# Patient Record
Sex: Male | Born: 1950 | Race: White | Hispanic: No | Smoking: Current every day smoker
Health system: Southern US, Community
[De-identification: ages and names within clinical notes are randomized; demographics above are authoritative.]

## PROBLEM LIST (undated history)

## (undated) DIAGNOSIS — J189 Pneumonia, unspecified organism: Secondary | ICD-10-CM

## (undated) DIAGNOSIS — E785 Hyperlipidemia, unspecified: Secondary | ICD-10-CM

## (undated) DIAGNOSIS — K219 Gastro-esophageal reflux disease without esophagitis: Secondary | ICD-10-CM

## (undated) DIAGNOSIS — F319 Bipolar disorder, unspecified: Secondary | ICD-10-CM

## (undated) DIAGNOSIS — I10 Essential (primary) hypertension: Secondary | ICD-10-CM

## (undated) DIAGNOSIS — IMO0001 Reserved for inherently not codable concepts without codable children: Secondary | ICD-10-CM

## (undated) DIAGNOSIS — J439 Emphysema, unspecified: Secondary | ICD-10-CM

## (undated) DIAGNOSIS — I499 Cardiac arrhythmia, unspecified: Secondary | ICD-10-CM

## (undated) DIAGNOSIS — I714 Abdominal aortic aneurysm, without rupture: Secondary | ICD-10-CM

## (undated) DIAGNOSIS — C3490 Malignant neoplasm of unspecified part of unspecified bronchus or lung: Secondary | ICD-10-CM

## (undated) HISTORY — DX: Emphysema, unspecified: J43.9

## (undated) HISTORY — PX: UPPER GI ENDOSCOPY: SHX6162

## (undated) HISTORY — PX: COLONOSCOPY W/ POLYPECTOMY: SHX1380

## (undated) HISTORY — DX: Malignant neoplasm of unspecified part of unspecified bronchus or lung: C34.90

## (undated) HISTORY — DX: Essential (primary) hypertension: I10

## (undated) HISTORY — DX: Abdominal aortic aneurysm, without rupture: I71.4

## (undated) HISTORY — PX: LUMBAR DISC SURGERY: SHX700

## (undated) HISTORY — PX: THUMB FUSION: SUR636

---

## 2011-02-22 ENCOUNTER — Inpatient Hospital Stay (HOSPITAL_COMMUNITY): Payer: 59

## 2011-02-22 ENCOUNTER — Inpatient Hospital Stay (HOSPITAL_COMMUNITY)
Admission: AD | Admit: 2011-02-22 | Discharge: 2011-02-26 | DRG: 189 | Disposition: A | Discharge status: Home / Self Care | Payer: 59 | Source: Other Acute Inpatient Hospital | Attending: Critical Care Medicine | Admitting: Critical Care Medicine

## 2011-02-22 DIAGNOSIS — Z6826 Body mass index (BMI) 26.0-26.9, adult: Secondary | ICD-10-CM

## 2011-02-22 DIAGNOSIS — R4182 Altered mental status, unspecified: Secondary | ICD-10-CM | POA: Diagnosis present

## 2011-02-22 DIAGNOSIS — J449 Chronic obstructive pulmonary disease, unspecified: Secondary | ICD-10-CM | POA: Diagnosis present

## 2011-02-22 DIAGNOSIS — E871 Hypo-osmolality and hyponatremia: Secondary | ICD-10-CM | POA: Diagnosis present

## 2011-02-22 DIAGNOSIS — F1010 Alcohol abuse, uncomplicated: Secondary | ICD-10-CM | POA: Diagnosis present

## 2011-02-22 DIAGNOSIS — J810 Acute pulmonary edema: Secondary | ICD-10-CM

## 2011-02-22 DIAGNOSIS — I5033 Acute on chronic diastolic (congestive) heart failure: Secondary | ICD-10-CM | POA: Diagnosis present

## 2011-02-22 DIAGNOSIS — I10 Essential (primary) hypertension: Secondary | ICD-10-CM | POA: Diagnosis present

## 2011-02-22 DIAGNOSIS — R0902 Hypoxemia: Secondary | ICD-10-CM | POA: Diagnosis present

## 2011-02-22 DIAGNOSIS — J9600 Acute respiratory failure, unspecified whether with hypoxia or hypercapnia: Secondary | ICD-10-CM | POA: Diagnosis present

## 2011-02-22 DIAGNOSIS — G9340 Encephalopathy, unspecified: Secondary | ICD-10-CM

## 2011-02-22 DIAGNOSIS — I509 Heart failure, unspecified: Secondary | ICD-10-CM | POA: Diagnosis present

## 2011-02-22 DIAGNOSIS — E46 Unspecified protein-calorie malnutrition: Secondary | ICD-10-CM | POA: Diagnosis present

## 2011-02-22 DIAGNOSIS — J4489 Other specified chronic obstructive pulmonary disease: Secondary | ICD-10-CM | POA: Diagnosis present

## 2011-02-22 DIAGNOSIS — F17200 Nicotine dependence, unspecified, uncomplicated: Secondary | ICD-10-CM | POA: Diagnosis present

## 2011-02-22 LAB — LACTIC ACID, PLASMA: Lactic Acid, Venous: 1.7 mmol/L (ref 0.5–2.2)

## 2011-02-22 LAB — DIFFERENTIAL
Basophils Absolute: 0 10*3/uL (ref 0.0–0.1)
Basophils Relative: 0 % (ref 0–1)
Eosinophils Relative: 0 % (ref 0–5)
Lymphocytes Relative: 5 % — ABNORMAL LOW (ref 12–46)

## 2011-02-22 LAB — PROTIME-INR: Prothrombin Time: 13.6 seconds (ref 11.6–15.2)

## 2011-02-22 LAB — CBC
MCHC: >38.5 g/dL — ABNORMAL HIGH (ref 30.0–36.0)
RDW: 12.8 % (ref 11.5–15.5)
WBC: 6.6 10*3/uL (ref 4.0–10.5)

## 2011-02-22 LAB — COMPREHENSIVE METABOLIC PANEL
ALT: 21 U/L (ref 0–53)
AST: 89 U/L — ABNORMAL HIGH (ref 0–37)
Albumin: 4.2 g/dL (ref 3.5–5.2)
Alkaline Phosphatase: 98 U/L (ref 39–117)
Potassium: 4.1 mEq/L (ref 3.5–5.1)
Sodium: 109 mEq/L — CL (ref 135–145)
Total Protein: 7.3 g/dL (ref 6.0–8.3)

## 2011-02-22 LAB — D-DIMER, QUANTITATIVE: D-Dimer, Quant: 0.85 ug/mL-FEU — ABNORMAL HIGH (ref 0.00–0.48)

## 2011-02-22 LAB — GLUCOSE, CAPILLARY: Glucose-Capillary: 140 mg/dL — ABNORMAL HIGH (ref 70–99)

## 2011-02-22 LAB — CARDIAC PANEL(CRET KIN+CKTOT+MB+TROPI)
CK, MB: 87.8 ng/mL (ref 0.3–4.0)
Troponin I: 0.45 ng/mL (ref <0.30)

## 2011-02-22 LAB — PHOSPHORUS: Phosphorus: 2.2 mg/dL — ABNORMAL LOW (ref 2.3–4.6)

## 2011-02-22 MED ORDER — PANTOPRAZOLE SODIUM 40 MG IV SOLR
40.0000 mg | Freq: Every day | INTRAVENOUS | Status: DC
  Administered 2011-02-23: 40 mg via INTRAVENOUS
  Filled 2011-02-22 (×2): qty 40

## 2011-02-22 MED ORDER — MAGNESIUM SULFATE 40 MG/ML IJ SOLN
2.0000 g | Freq: Once | INTRAMUSCULAR | Status: AC
  Administered 2011-02-22: 2 g via INTRAVENOUS
  Filled 2011-02-22: qty 50

## 2011-02-22 MED ORDER — ADULT MULTIVITAMIN W/MINERALS CH
1.0000 | ORAL_TABLET | Freq: Every day | ORAL | Status: DC
  Administered 2011-02-23 – 2011-02-26 (×5): 1 via ORAL
  Filled 2011-02-22 (×5): qty 1

## 2011-02-22 MED ORDER — HEPARIN SODIUM (PORCINE) 5000 UNIT/ML IJ SOLN
5000.0000 [IU] | Freq: Three times a day (TID) | INTRAMUSCULAR | Status: DC
  Administered 2011-02-23 – 2011-02-26 (×11): 5000 [IU] via SUBCUTANEOUS
  Filled 2011-02-22 (×14): qty 1

## 2011-02-22 MED ORDER — LOSARTAN POTASSIUM 50 MG PO TABS
50.0000 mg | ORAL_TABLET | Freq: Every day | ORAL | Status: DC
  Administered 2011-02-22 – 2011-02-26 (×5): 50 mg via ORAL
  Filled 2011-02-22 (×5): qty 1

## 2011-02-22 MED ORDER — TIOTROPIUM BROMIDE MONOHYDRATE 18 MCG IN CAPS
18.0000 ug | ORAL_CAPSULE | Freq: Every day | RESPIRATORY_TRACT | Status: DC
  Administered 2011-02-23 – 2011-02-26 (×3): 18 ug via RESPIRATORY_TRACT
  Filled 2011-02-22: qty 5

## 2011-02-22 MED ORDER — LORAZEPAM 2 MG/ML IJ SOLN
1.0000 mg | Freq: Four times a day (QID) | INTRAMUSCULAR | Status: AC | PRN
  Administered 2011-02-22 – 2011-02-23 (×2): 1 mg via INTRAVENOUS
  Filled 2011-02-22 (×2): qty 1

## 2011-02-22 MED ORDER — FUROSEMIDE 10 MG/ML IJ SOLN
40.0000 mg | Freq: Two times a day (BID) | INTRAMUSCULAR | Status: DC
  Administered 2011-02-23 – 2011-02-25 (×5): 40 mg via INTRAVENOUS
  Filled 2011-02-22 (×6): qty 4

## 2011-02-22 MED ORDER — FOLIC ACID 1 MG PO TABS
1.0000 mg | ORAL_TABLET | Freq: Every day | ORAL | Status: DC
  Administered 2011-02-23 – 2011-02-26 (×5): 1 mg via ORAL
  Filled 2011-02-22 (×5): qty 1

## 2011-02-22 MED ORDER — LORAZEPAM 1 MG PO TABS
1.0000 mg | ORAL_TABLET | Freq: Four times a day (QID) | ORAL | Status: AC | PRN
  Administered 2011-02-23 – 2011-02-25 (×5): 1 mg via ORAL
  Filled 2011-02-22 (×3): qty 1

## 2011-02-22 MED ORDER — ALBUTEROL SULFATE (5 MG/ML) 0.5% IN NEBU
2.5000 mg | INHALATION_SOLUTION | RESPIRATORY_TRACT | Status: DC
  Administered 2011-02-23 (×4): 2.5 mg via RESPIRATORY_TRACT
  Filled 2011-02-22 (×4): qty 0.5

## 2011-02-22 MED ORDER — THIAMINE HCL 100 MG/ML IJ SOLN
100.0000 mg | Freq: Every day | INTRAMUSCULAR | Status: DC
  Administered 2011-02-22 – 2011-02-24 (×3): 100 mg via INTRAVENOUS
  Filled 2011-02-22 (×3): qty 1

## 2011-02-22 MED ORDER — LORAZEPAM 1 MG PO TABS
0.0000 mg | ORAL_TABLET | Freq: Four times a day (QID) | ORAL | Status: AC
  Administered 2011-02-23 – 2011-02-24 (×3): 1 mg via ORAL
  Filled 2011-02-22 (×5): qty 1

## 2011-02-22 MED ORDER — FUROSEMIDE 10 MG/ML IJ SOLN
INTRAMUSCULAR | Status: AC
  Filled 2011-02-22: qty 8

## 2011-02-22 MED ORDER — FUROSEMIDE 10 MG/ML IJ SOLN
80.0000 mg | Freq: Once | INTRAMUSCULAR | Status: DC
  Administered 2011-02-22: 80 mg via INTRAVENOUS

## 2011-02-22 MED ORDER — POTASSIUM PHOSPHATE DIBASIC 3 MMOLE/ML IV SOLN
15.0000 mmol | Freq: Once | INTRAVENOUS | Status: AC
  Administered 2011-02-22: 15 mmol via INTRAVENOUS
  Filled 2011-02-22: qty 5

## 2011-02-22 MED ORDER — ALBUTEROL SULFATE (5 MG/ML) 0.5% IN NEBU: 2.5000 mg | INHALATION_SOLUTION | RESPIRATORY_TRACT | Status: DC | PRN

## 2011-02-22 MED ORDER — SODIUM CHLORIDE 0.9 % IV SOLN: 250.0000 mL | INTRAVENOUS | Status: DC | PRN

## 2011-02-22 MED ORDER — LORAZEPAM 1 MG PO TABS
0.0000 mg | ORAL_TABLET | Freq: Two times a day (BID) | ORAL | Status: AC
  Administered 2011-02-24 – 2011-02-26 (×4): 1 mg via ORAL
  Filled 2011-02-22 (×4): qty 1

## 2011-02-22 MED ORDER — SODIUM CHLORIDE 0.9 % IJ SOLN
INTRAMUSCULAR | Status: AC
  Filled 2011-02-22: qty 10

## 2011-02-22 NOTE — H&P (Signed)
Name: Gerald Ho MRN: [id number] DOB: January 09, 1950    LOS: 0  PCCM ADMISSION NOTE  History of Present Illness: 61 y/o alcoholic reporting drinking at least 6 cans of beer a day who presented to [facility] [facility] with altered with dyspnea and altered mental status.  Found to be hypertensive --> started on nitroglycerine infusion.  CXR was consistent with pulmonary edema and ABG revealed hypoxemia --> oxygen started, lasix given.  Sodium was noted to be 100 --> hypertonic saline started.  Lines / Drains: None  Cultures: None  Antibiotics: None  Tests / Events: 2/18  CXR ([place])>>> pulmonary vascular congestion, consistent with pulmonary edema  No past medical history on file. No past surgical history on file. Prior to Admission medications   Not on File   Allergies Allergies not on file  Family History No family history on file.  Social History  does not have a smoking history on file. He does not have any smokeless tobacco history on file. His alcohol and drug histories not on file.  Wife reports patient drinking at least 6 beers on week days and about double on weekends.  Review Of Systems  Patient unable to provide  Vital Signs:  P 105 R 22 BP 137/75  SpO2 92     Physical Examination: General:  Appears chronically ill, but not in distress Neuro:  Awake, alert, oriented, nonfocal HEENT:  PERRL, pink conjunctivae, moist membranes Neck:  Supple, no JVD   Cardiovascular:  RRR, no M/R/G Lungs:  Bilateral diminished air entry, few bibasilar rales Abdomen:  Soft, nontender, nondistended, bowel sounds present Musculoskeletal:  Moves all extremities, trace pedal edema Skin:  No rash  Ventilator settings:   Labs and Imaging:  Reviewed.  Please refer to the Assessment and Plan section for relevant results.  ASSESSMENT AND PLAN  NEUROLOGIC A:  Mild encephalopathy, likely secondary to hyponatremia / hypertension.  History of alcohol abuse.  High risk for alcoholic  delirium.  History of bipolar mood disorder on Lamictal. P: -->  Correct hyponatremia -->  Folate / Thiamine -->  CIWA q4h -->  Restart Lamictal when dose is confirmed  PULMONARY No results found for this basename: PHART:5,PCO2:5,PCO2ART:5,PO2ART:5,HCO3:5,O2SAT:5 in the last 168 hours ABG (outside)>>>7.46 / 26 / 48  CXR (outside)>>>Pulmonary edema A:  Hypoxemic respiratory failure secondary to pulmonary edema related to hypertensive emergenncy.  Background emphysema. P: -->  Supplemental oxygen, goal SpO2>92% -->  Lasix 80 mg IV now, then 40 mg IV bid -->  Albuterol nebs q4h and q2h PRN -->  Spiriva per preadmission regimen  CARDIOVASCULAR  Lab 02/22/11 2007  TROPONINI --  LATICACIDVEN 1.7  PROBNP --   A:  Hypertensive emergency, now improved on Nitriglycerin infusion.  Elevated BNP.  History of congestive heart failure. P: -->  Continue Nitroglycerine gtt for now, titrate to off -->  Start  Losartan, first dose now -->  TTE in AM  RENAL  Lab 02/22/11 2010  NA 109*  K 4.1  CL 72*  CO2 23  BUN 7  CREATININE 0.71  CALCIUM 9.0  MG 1.2*  PHOS 2.2*   Na (outside)>>>100, S Osm (outside) >>>196 A:  Hypervolemic hyponatremia.  Minimally symptomatic.  Suspect subacute, last known Na in Jan of 2013 is 118.  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  Hypomagnesemia.  Hypophosphatemia. P: -->  Lasix as above -->  BMP now and in 4 hours -->  S Osm, U Osm, U Na -->  Will reconsider hypertonic saline while the results are available -->  Magnesium Sulfate 2 g IV x 1 -->  KPhos 15 mmol IV x 1  GASTROINTESTINAL  Lab 02/22/11 2010  AST 89*  ALT 21  ALKPHOS 98  BILITOT 1.5*  PROT 7.3  ALBUMIN 4.2   A:  No acute issues.  History of diarrhea.  P: -->  Observe  HEMATOLOGIC  Lab 02/22/11 2010  HGB --  HCT --  PLT --  INR 1.02  APTT 30   Hb (outside)>>> 14.9, INR (outside)>>>1.2, APTT (outside)>>>30 A:  No acute issues. P: -->  CBC in AM  INFECTIOUS  Lab 02/22/11 2010 02/22/11  2009  WBC 6.6 --  PROCALCITON -- 0.22   WBC ([place])>>>6.6 A:  No evidence of infection. P: -->  CBC in AM -->  PCT  ENDOCRINE  Lab 02/22/11 1854  GLUCAP 140*   A:  No evidence of endocrine abnormalities. P: -->  Check cortisol, TSH  BEST PRACTICE / DISPOSITION -->  ICU status under PCCM -->  Full code -->  NPO -->  Heparin [place] for DVT Px -->  Protonix IV for GI Px -->  Family updated at bedside  The patient is critically ill with multiple organ systems failure and requires high complexity decision making for assessment and support, frequent evaluation and titration of therapies, application of advanced monitoring technologies and extensive interpretation of multiple databases. Critical Care Time devoted to patient care services described in this note is 35 minutes.  Orlean Bradford, M.D. Pulmonary and Critical Care Medicine [facility] Cell: [phone] Pager: [phone]  02/22/2011, 7:20 PM

## 2011-02-22 NOTE — Progress Notes (Signed)
eLink Physician-Brief Progress Note Patient Name: Gerald Ho DOB: [birth year] MRN: [id number]  Date of Service  02/22/2011   HPI/Events of Note   Pt admitted in transfer from [facility] with Hyponatremia, CHF, diarrhea, resp failure, AMS Full note to follow. Hold orders sent until patient seen by bedside CCM MD  eICU Interventions  See basic admit orders    Intervention Category Major Interventions: Respiratory failure - evaluation and management;Electrolyte abnormality - evaluation and management  Shan Levans 02/22/2011, 7:03 PM

## 2011-02-22 NOTE — Progress Notes (Signed)
eLink Physician-Brief Progress Note Patient Name: Gerald Ho DOB: June 21, 1950 MRN: [id number]  Date of Service  02/22/2011   HPI/Events of Note  Pt with ETOH use  eICU Interventions  CIWA protocol with ativan ordered    Intervention Category Major Interventions: Change in mental status - evaluation and management  Shan Levans 02/22/2011, 8:57 PM

## 2011-02-23 ENCOUNTER — Encounter (HOSPITAL_COMMUNITY): Payer: Self-pay

## 2011-02-23 ENCOUNTER — Inpatient Hospital Stay (HOSPITAL_COMMUNITY): Payer: 59

## 2011-02-23 DIAGNOSIS — R4182 Altered mental status, unspecified: Secondary | ICD-10-CM | POA: Diagnosis present

## 2011-02-23 DIAGNOSIS — J810 Acute pulmonary edema: Secondary | ICD-10-CM

## 2011-02-23 DIAGNOSIS — F1010 Alcohol abuse, uncomplicated: Secondary | ICD-10-CM

## 2011-02-23 DIAGNOSIS — E871 Hypo-osmolality and hyponatremia: Secondary | ICD-10-CM | POA: Diagnosis present

## 2011-02-23 DIAGNOSIS — I10 Essential (primary) hypertension: Secondary | ICD-10-CM

## 2011-02-23 DIAGNOSIS — I5033 Acute on chronic diastolic (congestive) heart failure: Secondary | ICD-10-CM | POA: Diagnosis present

## 2011-02-23 LAB — CBC
MCH: 35.2 pg — ABNORMAL HIGH (ref 26.0–34.0)
MCHC: 38.8 g/dL — ABNORMAL HIGH (ref 30.0–36.0)
MCV: 90.8 fL (ref 78.0–100.0)
Platelets: 227 10*3/uL (ref 150–400)
RDW: 12.9 % (ref 11.5–15.5)

## 2011-02-23 LAB — BASIC METABOLIC PANEL
BUN: 8 mg/dL (ref 6–23)
BUN: 8 mg/dL (ref 6–23)
CO2: 22 mEq/L (ref 19–32)
CO2: 24 mEq/L (ref 19–32)
CO2: 25 mEq/L (ref 19–32)
Calcium: 8.7 mg/dL (ref 8.4–10.5)
Chloride: 72 mEq/L — ABNORMAL LOW (ref 96–112)
Chloride: 76 mEq/L — ABNORMAL LOW (ref 96–112)
Creatinine, Ser: 0.72 mg/dL (ref 0.50–1.35)
Creatinine, Ser: 0.79 mg/dL (ref 0.50–1.35)
GFR calc Af Amer: 57 mL/min — ABNORMAL LOW (ref >90)
Glucose, Bld: 144 mg/dL — ABNORMAL HIGH (ref 70–99)
Potassium: 3.7 mEq/L (ref 3.5–5.1)
Sodium: 113 mEq/L — CL (ref 135–145)

## 2011-02-23 LAB — CARDIAC PANEL(CRET KIN+CKTOT+MB+TROPI)
Relative Index: 2 (ref 0.0–2.5)
Troponin I: 0.66 ng/mL (ref <0.30)
Troponin I: <0.3 ng/mL (ref <0.30)

## 2011-02-23 LAB — CORTISOL: Cortisol, Plasma: 40.4 ug/dL

## 2011-02-23 MED ORDER — BUDESONIDE-FORMOTEROL FUMARATE 160-4.5 MCG/ACT IN AERO
2.0000 | INHALATION_SPRAY | Freq: Two times a day (BID) | RESPIRATORY_TRACT | Status: DC
  Administered 2011-02-23 – 2011-02-26 (×5): 2 via RESPIRATORY_TRACT
  Filled 2011-02-23: qty 6

## 2011-02-23 MED ORDER — ALBUTEROL SULFATE (5 MG/ML) 0.5% IN NEBU: 2.5000 mg | INHALATION_SOLUTION | RESPIRATORY_TRACT | Status: DC | PRN

## 2011-02-23 MED ORDER — PANTOPRAZOLE SODIUM 40 MG PO TBEC
40.0000 mg | DELAYED_RELEASE_TABLET | Freq: Every day | ORAL | Status: DC
  Administered 2011-02-23: 40 mg via ORAL
  Filled 2011-02-23: qty 1

## 2011-02-23 MED ORDER — SODIUM CHLORIDE 0.9 % IJ SOLN
INTRAMUSCULAR | Status: AC
  Administered 2011-02-23: 10 mL
  Filled 2011-02-23: qty 10

## 2011-02-23 MED ORDER — POTASSIUM CHLORIDE CRYS ER 20 MEQ PO TBCR
40.0000 meq | EXTENDED_RELEASE_TABLET | Freq: Once | ORAL | Status: AC
  Administered 2011-02-23: 40 meq via ORAL
  Filled 2011-02-23: qty 2

## 2011-02-23 MED ORDER — ALBUTEROL SULFATE (5 MG/ML) 0.5% IN NEBU
2.5000 mg | INHALATION_SOLUTION | Freq: Four times a day (QID) | RESPIRATORY_TRACT | Status: DC
  Administered 2011-02-23 – 2011-02-26 (×11): 2.5 mg via RESPIRATORY_TRACT
  Filled 2011-02-23 (×11): qty 0.5

## 2011-02-23 MED ORDER — FUROSEMIDE 10 MG/ML IJ SOLN
INTRAMUSCULAR | Status: AC
  Administered 2011-02-23: 40 mg via INTRAVENOUS
  Filled 2011-02-23: qty 4

## 2011-02-23 MED ORDER — SODIUM CHLORIDE 0.9 % IV SOLN
250.0000 mL | INTRAVENOUS | Status: DC | PRN
  Administered 2011-02-24: 1000 mL via INTRAVENOUS

## 2011-02-23 NOTE — Progress Notes (Signed)
Clinical Social Worker met with pt and family at bedside.  CSW provided emotional support.  CSW reviewed coping skills and support system.  Please see shadow chart for details of psychosocial assessment.  No other CSW needs identified at this time, CSW signing off at this time.  Please re consult if needed.  Angelia Mould, MSW, [place] [phone]

## 2011-02-23 NOTE — Progress Notes (Signed)
CRITICAL VALUE ALERT  Critical value received:  NA 110  Date of notification:  02/23/11  Time of notification:  0045  Critical value read back:yes  Nurse who received alert:  Charlesetta Garibaldi  MD notified (1st page):  Dr Fara Boros  Time of first page:  0046  MD notified (2nd page):  Time of second page:  Responding MD:  Dr Fara Boros  Time MD responded:  [phone]

## 2011-02-23 NOTE — Progress Notes (Signed)
  Echocardiogram 2D Echocardiogram has been performed.  Gerald Ho, Real Cons 02/23/2011, 10:39 AM

## 2011-02-23 NOTE — Progress Notes (Signed)
UR Completed.  Gerald Ho [name] [phone] 02/23/2011  

## 2011-02-23 NOTE — Progress Notes (Signed)
CRITICAL VALUE ALERT  Critical value received:  Serum Osom = 223  Date of notification:  02/22/11  Time of notification:  2130  Critical value read back:yes  Nurse who received alert:  Charlesetta Garibaldi  MD notified (1st page):  Dr Erich Montane  Time of first page:  2130  MD notified (2nd page):  Time of second page:  Responding MD:  Dr Fara Boros  Time MD responded:  2140

## 2011-02-23 NOTE — Progress Notes (Signed)
CRITICAL VALUE ALERT  Critical value received:  CK MB=87.8, Troponin=0.45, Na=109  Date of notification:  02/22/11  Time of notification:  2100  Critical value read back:yes  Nurse who received alert:  Charlesetta Garibaldi  MD notified (1st page):  Dr Delford Field Time of first page:  2103  MD notified (2nd page):  Time of second page:  Responding MD:  Dr Delford Field  Time MD responded:  2103

## 2011-02-23 NOTE — Progress Notes (Signed)
Gerald Ho is a 61 y.o. male smoker transferred from [place] on 02/22/2011 with hyponatremia from beer potomania.  Reports drinking 6 to 12 beers per day, and gets "shakes" if he doesn't drink.  Noted to have HTN and pulmonary edema on CXR at [facility] and given lasix/NTG gtt.  Tests/events:  SUBJECTIVE: Breathing better.  Still has cough.  Reports last drink of alcohol 2 days ago.  OBJECTIVE:  Blood pressure 115/59, pulse 90, temperature 97.9 F (36.6 C), temperature source Oral, resp. rate 20, height 6' (1.829 m), weight 195 lb 5.2 oz (88.6 kg), SpO2 94.00%. Wt Readings from Last 3 Encounters:  02/23/11 195 lb 5.2 oz (88.6 kg)   Body mass index is 26.49 kg/(m^2).  I/O last 3 completed shifts: In: 548 [I.V.:280; IV Piggyback:268] Out: 4875 [Urine:4875]  General - no distress HEENT - no sinus tenderness Cardiac - s1s2 regular, no murmur Chest - b/l wheeze Abd - soft, nontender Ext - no edema Neuro - calm, appropriate, follows commands  BMET    Component Value Date/Time   NA 110* 02/23/2011 0335   K 3.8 02/23/2011 0335   CL 74* 02/23/2011 0335   CO2 24 02/23/2011 0335   GLUCOSE 144* 02/23/2011 0335   BUN 8 02/23/2011 0335   CREATININE 0.79 02/23/2011 0335   CALCIUM 8.7 02/23/2011 0335   GFRNONAA >90 02/23/2011 0335   GFRAA >90 02/23/2011 0335    CBC    Component Value Date/Time   WBC 6.9 02/23/2011 0335   RBC 4.12* 02/23/2011 0335   HGB 14.5 02/23/2011 0335   HCT 37.4* 02/23/2011 0335   PLT 227 02/23/2011 0335   MCV 90.8 02/23/2011 0335   MCH 35.2* 02/23/2011 0335   MCHC 38.8* 02/23/2011 0335   RDW 12.9 02/23/2011 0335   LYMPHSABS 0.4* 02/22/2011 2010   MONOABS 0.2 02/22/2011 2010   EOSABS 0.0 02/22/2011 2010   BASOSABS 0.0 02/22/2011 2010    Portable Chest Xray In Am  02/23/2011  *RADIOLOGY REPORT*  Clinical Data: Pulmonary edema.  PORTABLE CHEST - 1 VIEW  Comparison: Chest x-ray 02/22/2011.  Findings: Lung volumes are normal.  The left costophrenic sulcus is excluded from the  lower margin of the image (a small left pleural effusion cannot be entirely excluded, but is not favored).  Minimal linear opacities in the lower lungs bilaterally likely reflect subsegmental atelectasis, however, there is more extensive opacification in the right lower lung. Pulmonary vasculature is normal.  There is mild diffuse interstitial prominence throughout predominately in the mid and lower lungs bilaterally, with mild thickening of the central bronchi. Heart size is normal. Mediastinal contours are unremarkable.  IMPRESSION: 1.  Mild diffuse interstitial prominence and thickening of the central bronchi.  This appearance is nonspecific, and could be chronic in this patient, however, correlation for signs and symptoms of bronchitis is recommended.  Additionally, appearance of right lower lobe could suggest an area of aspiration pneumonitis.  Original Report Authenticated By: Florencia Reasons, M.D.   Portable Chest Xray  02/22/2011  *RADIOLOGY REPORT*  Clinical Data: Pulmonary edema  PORTABLE CHEST - 1 VIEW  Comparison: None.  Findings: COPD with hyperinflation.  Hazy lung density is present in the bases and right perihilar region.  This could be due to edema or pneumonia.  No significant effusion.  Heart size is mildly enlarged peri  IMPRESSION: COPD.  Bilateral airspace disease may represent edema or pneumonia.  Original Report Authenticated By: Camelia Phenes, M.D.    ASSESSMENT/PLAN:  Hyponatremia -keep even  fluid balance -continue normal saline IV fluid -f/u BMET  Hypertension with acute pulmonary edema likely from acute diastolic dysfunction -euvolemic on 2/19 -f/u Echo -continue cozaar  Low magnesium and phosphorus -f/u and replace as needed  Alcohol abuse -continue scheduled Ativan -thiamine, folic acid -monitor in ICU 1/61 for DT's  COPD with continue tobacco abuse -continue spiriva -restart symbicort -continue scheduled nebs for now  Protein calorie  malnutrition -advance diet  Dennie Moltz Pager:  [phone] 02/23/2011, 12:30 PM

## 2011-02-23 NOTE — Progress Notes (Signed)
Pt's wife called and updated

## 2011-02-24 LAB — COMPREHENSIVE METABOLIC PANEL
ALT: 22 U/L (ref 0–53)
AST: 60 U/L — ABNORMAL HIGH (ref 0–37)
Alkaline Phosphatase: 83 U/L (ref 39–117)
CO2: 25 mEq/L (ref 19–32)
GFR calc Af Amer: 73 mL/min — ABNORMAL LOW (ref >90)
GFR calc non Af Amer: 63 mL/min — ABNORMAL LOW (ref >90)
Glucose, Bld: 106 mg/dL — ABNORMAL HIGH (ref 70–99)
Potassium: 3.6 mEq/L (ref 3.5–5.1)
Sodium: 115 mEq/L — CL (ref 135–145)
Total Protein: 6.5 g/dL (ref 6.0–8.3)

## 2011-02-24 LAB — CBC
HCT: 38.2 % — ABNORMAL LOW (ref 39.0–52.0)
MCH: 35.2 pg — ABNORMAL HIGH (ref 26.0–34.0)
RDW: 13.1 % (ref 11.5–15.5)

## 2011-02-24 LAB — PHOSPHORUS: Phosphorus: 2.9 mg/dL (ref 2.3–4.6)

## 2011-02-24 MED ORDER — SODIUM CHLORIDE 0.9 % IV SOLN
INTRAVENOUS | Status: DC
  Administered 2011-02-24: 1000 mL via INTRAVENOUS
  Administered 2011-02-24 – 2011-02-25 (×2): via INTRAVENOUS
  Administered 2011-02-25: 1000 mL via INTRAVENOUS

## 2011-02-24 MED ORDER — INFLUENZA VIRUS VACC SPLIT PF IM SUSP
0.5000 mL | INTRAMUSCULAR | Status: AC
  Administered 2011-02-24: 0.5 mL via INTRAMUSCULAR
  Filled 2011-02-24 (×2): qty 0.5

## 2011-02-24 MED ORDER — VITAMIN B-1 100 MG PO TABS
100.0000 mg | ORAL_TABLET | Freq: Every day | ORAL | Status: DC
  Administered 2011-02-25 – 2011-02-26 (×2): 100 mg via ORAL
  Filled 2011-02-24 (×2): qty 1

## 2011-02-24 MED ORDER — PANTOPRAZOLE SODIUM 40 MG PO TBEC
40.0000 mg | DELAYED_RELEASE_TABLET | Freq: Two times a day (BID) | ORAL | Status: DC
  Administered 2011-02-24 – 2011-02-26 (×4): 40 mg via ORAL
  Filled 2011-02-24 (×4): qty 1

## 2011-02-24 MED ORDER — DM-GUAIFENESIN ER 30-600 MG PO TB12
2.0000 | ORAL_TABLET | Freq: Two times a day (BID) | ORAL | Status: DC
  Administered 2011-02-24 – 2011-02-26 (×5): 2 via ORAL
  Filled 2011-02-24 (×6): qty 2

## 2011-02-24 MED ORDER — SALINE SPRAY 0.65 % NA SOLN
1.0000 | NASAL | Status: DC | PRN
  Administered 2011-02-24 – 2011-02-26 (×3): 1 via NASAL
  Filled 2011-02-24: qty 44

## 2011-02-24 MED ORDER — PNEUMOCOCCAL VAC POLYVALENT 25 MCG/0.5ML IJ INJ
0.5000 mL | INJECTION | INTRAMUSCULAR | Status: AC
  Administered 2011-02-25: 0.5 mL via INTRAMUSCULAR
  Filled 2011-02-24: qty 0.5

## 2011-02-24 MED ORDER — PNEUMOCOCCAL VAC POLYVALENT 25 MCG/0.5ML IJ INJ
0.5000 mL | INJECTION | INTRAMUSCULAR | Status: DC
  Filled 2011-02-24 (×2): qty 0.5

## 2011-02-24 NOTE — Progress Notes (Signed)
Pt agitated about being in the hospital and feeling like he doesn't know what the plan is for his hospital stay and when he will go home. Spoke with Dr. Sherene Sires concerning pt and family's concerns. Informed pt and wife (Judy-via cell phone) that MD is waiting on pt's NA level and breathing to improve and there is no definite date on when pt will be discharged. MD will be back in the morning to evaluate pt's condition. [place], Rosanna Randy

## 2011-02-24 NOTE — Progress Notes (Signed)
Gerald Ho is a 61 y.o. male smoker transferred from [place] on 02/22/2011 with hyponatremia from beer potomania.  Reports drinking 6 to 12 beers per day, and gets "shakes" if he doesn't drink.  Noted to have HTN and pulmonary edema on CXR at [facility] and given lasix/NTG gtt.  Tests/events: Echo 2/19 The cavity size was normal. Wall thickness was increased in a pattern of mild LVH. There was mild concentric hypertrophy. Systolic function was normal. The estimated ejection fraction was in the range of 55% to 60%. Wall motion was normal; there were no regional wall motion abnormalities. Doppler parameters are consistent with abnormal left ventricular relaxation (grade 1 diastolic dysfunction). - Mitral valve: Mildly calcified annulus. - Left atrium: The atrium was mildly dilated.    SUBJECTIVE: Breathing better.  Still has congested cough.  Reports last drink of alcohol 2 pta   OBJECTIVE:  Blood pressure 105/58, pulse 77, temperature 98.2 F (36.8 C), temperature source Oral, resp. rate 18, height 6' (1.829 m), weight 195 lb 12.3 oz (88.8 kg), SpO2 96.00%. Wt Readings from Last 3 Encounters:  02/23/11 195 lb 12.3 oz (88.8 kg)   Body mass index is 26.55 kg/(m^2).   Intake/Output Summary (Last 24 hours) at 02/24/11 1129 Last data filed at 02/24/11 1005  Gross per 24 hour  Intake   1224 ml  Output    445 ml  Net    779 ml       PEx General - no distress but congested rattling cough HEENT - no sinus tenderness Cardiac - s1s2 regular, no murmur Chest - b/l wheeze Abd - soft, nontender Ext - no edema Neuro - calm, appropriate, follows commands  Lab 02/24/11 0600 02/23/11 1543 02/23/11 0335  NA 115* 113* 110*  K 3.6 3.4* 3.8  CL 79* 76* 74*  CO2 25 25 24   BUN 20 15 8   CREATININE 1.21 1.49* 0.79  GLUCOSE 106* 132* 144*    Lab 02/24/11 0600 02/23/11 0335 02/22/11 2010  HGB 14.3 14.5 15.2  HCT 38.2* 37.4* 38.7*  WBC 7.8 6.9 6.6  PLT 209 227 256     Portable Chest  Xray In Am  02/23/2011  *RADIOLOGY REPORT*  Clinical Data: Pulmonary edema.  PORTABLE CHEST - 1 VIEW  Comparison: Chest x-ray 02/22/2011.  Findings: Lung volumes are normal.  The left costophrenic sulcus is excluded from the lower margin of the image (a small left pleural effusion cannot be entirely excluded, but is not favored).  Minimal linear opacities in the lower lungs bilaterally likely reflect subsegmental atelectasis, however, there is more extensive opacification in the right lower lung. Pulmonary vasculature is normal.  There is mild diffuse interstitial prominence throughout predominately in the mid and lower lungs bilaterally, with mild thickening of the central bronchi. Heart size is normal. Mediastinal contours are unremarkable.  IMPRESSION: 1.  Mild diffuse interstitial prominence and thickening of the central bronchi.  This appearance is nonspecific, and could be chronic in this patient, however, correlation for signs and symptoms of bronchitis is recommended.  Additionally, appearance of right lower lobe could suggest an area of aspiration pneumonitis.  Original Report Authenticated By: Florencia Reasons, M.D.   Portable Chest Xray  02/22/2011  *RADIOLOGY REPORT*  Clinical Data: Pulmonary edema  PORTABLE CHEST - 1 VIEW  Comparison: None.  Findings: COPD with hyperinflation.  Hazy lung density is present in the bases and right perihilar region.  This could be due to edema or pneumonia.  No significant effusion.  Heart  size is mildly enlarged peri  IMPRESSION: COPD.  Bilateral airspace disease may represent edema or pneumonia.  Original Report Authenticated By: Camelia Phenes, M.D.    ASSESSMENT/PLAN:  Hyponatremia U na 10, U osm 133 on admit c/w polydypsia -keep even fluid balance -continue normal saline IV fluid -f/u BMET  Hypertension with acute pulmonary edema likely from acute diastolic dysfunction -euvolemic on 2/19  Echo 2/19 > GI diast dysfunction -continue cozaar  Low  magnesium and phosphorus -f/u and replace as needed  Alcohol abuse -continue scheduled Ativan -thiamine, folic acid -monitor in ICU 9/60 for DT's  COPD with continue tobacco abuse pta -continue spiriva -continue symbicort -continue scheduled nebs for now but plan to taper to prn  Protein calorie malnutrition -advance diet  Sandrea Hughs, MD Pulmonary and Critical Care Medicine [facility] Cell [phone]

## 2011-02-25 DIAGNOSIS — E871 Hypo-osmolality and hyponatremia: Secondary | ICD-10-CM

## 2011-02-25 DIAGNOSIS — J449 Chronic obstructive pulmonary disease, unspecified: Secondary | ICD-10-CM

## 2011-02-25 DIAGNOSIS — G9340 Encephalopathy, unspecified: Secondary | ICD-10-CM

## 2011-02-25 LAB — CBC
HCT: 37.3 % — ABNORMAL LOW (ref 39.0–52.0)
Hemoglobin: 13.5 g/dL (ref 13.0–17.0)
MCH: 35 pg — ABNORMAL HIGH (ref 26.0–34.0)
MCHC: 36.2 g/dL — ABNORMAL HIGH (ref 30.0–36.0)
RDW: 13.1 % (ref 11.5–15.5)

## 2011-02-25 LAB — BASIC METABOLIC PANEL
BUN: 15 mg/dL (ref 6–23)
BUN: 14 mg/dL (ref 6–23)
CO2: 26 mEq/L (ref 19–32)
Calcium: 8.7 mg/dL (ref 8.4–10.5)
Calcium: 8.8 mg/dL (ref 8.4–10.5)
Creatinine, Ser: 0.9 mg/dL (ref 0.50–1.35)
Creatinine, Ser: 0.91 mg/dL (ref 0.50–1.35)
GFR calc Af Amer: >90 mL/min (ref >90)
GFR calc non Af Amer: >90 mL/min (ref >90)
GFR calc non Af Amer: >90 mL/min (ref >90)
Glucose, Bld: 103 mg/dL — ABNORMAL HIGH (ref 70–99)
Glucose, Bld: 109 mg/dL — ABNORMAL HIGH (ref 70–99)
Potassium: 3.6 mEq/L (ref 3.5–5.1)

## 2011-02-25 MED ORDER — FUROSEMIDE 40 MG PO TABS
40.0000 mg | ORAL_TABLET | Freq: Every day | ORAL | Status: DC
  Administered 2011-02-25 – 2011-02-26 (×2): 40 mg via ORAL
  Filled 2011-02-25 (×2): qty 1

## 2011-02-25 NOTE — Progress Notes (Signed)
Patient: Gerald Ho DOB: 02-28-1950 MRN: [id number] Admite date: 02/22/2011        PCCM Follow Up Note   Brief patient profile:   61 y.o. male smoker transferred from [place] on 02/22/2011 with hyponatremia and AMS from beer potomania.  Reports drinking 6 to 12 beers per day, and gets "shakes" if he doesn't drink.  Noted to have HTN and pulmonary edema on CXR at [facility] and given lasix/NTG gtt.  Tests/events: Echo 2/19 The cavity size was normal. Wall thickness was increased in a pattern of mild LVH. There was mild concentric hypertrophy. Systolic function was normal. The estimated ejection fraction was in the range of 55% to 60%. Wall motion was normal; there were no regional wall motion abnormalities. Doppler parameters are consistent with abnormal left ventricular relaxation (grade 1 diastolic dysfunction). - Mitral valve: Mildly calcified annulus. - Left atrium: The atrium was mildly dilated.   SUBJECTIVE: Anxious to go home.  States SOB better.  Remains very confused at times per nsg, family.   OBJECTIVE: Filed Vitals:   02/25/11 0538 02/25/11 0818 02/25/11 0820 02/25/11 1000  BP: 129/87   119/64  Pulse: 85   85  Temp: 98.4 F (36.9 C)   98.6 F (37 C)  TempSrc: Oral   Oral  Resp: 18   18  Height:      Weight:      SpO2: 93% 91% 90% 96%    Wt Readings from Last 3 Encounters:  02/23/11 195 lb 12.3 oz (88.8 kg)    Intake/Output Summary (Last 24 hours) at 02/25/11 1034 Last data filed at 02/25/11 0951  Gross per 24 hour  Intake    600 ml  Output   3487 ml  Net  -2887 ml       . sodium chloride 1,000 mL (02/25/11 7829)     Exam:  General - no distress but congested rattling cough HEENT - mm dry, no sinus tenderness Cardiac - s1s2 regular, no murmur Chest - resps even non labored on 50% VM, b/l wheeze Abd - soft, nontender Ext - no edema Neuro - anxious, confused at times  Lab 02/25/11 0620 02/24/11 0600 02/23/11 1543  NA 122* 115* 113*  K 3.6 3.6  3.4*  CL 88* 79* 76*  CO2 27 25 25   BUN 15 20 15   CREATININE 0.90 1.21 1.49*  GLUCOSE 103* 106* 132*    Lab 02/25/11 0620 02/24/11 0600 02/23/11 0335  HGB 13.5 14.3 14.5  HCT 37.3* 38.2* 37.4*  WBC 6.7 7.8 6.9  PLT 208 209 227      ASSESSMENT/PLAN:  Hyponatremia -- with AMS.  Chronic hyponatremia to some degreee in setting ETOH.  U na 10, U osm 133 on admit c/w polydypsia Lab Results  Component Value Date   NA 128* 02/25/2011   NA 122* 02/25/2011   NA 115* 02/24/2011    PLAN -  -keep even fluid balance -continue normal saline IV fluid -- decrease 2/21 -f/u BMET -cont lasix -Pt/ot consult  Hypertension with acute pulmonary edema likely from acute diastolic dysfunction -- Now with neg fluid balance.  PLAN -  -cont gentle NS  Echo 2/19 >   diast dysfunction -continue cozaar -Cont lasix - change to PO 2/21  Low magnesium and phosphorus -resolved 2/21   Alcohol abuse -- Remains confused.  Last ETOH approx 2 days PTA.  -CIWA -PRN ativan  -thiamine, folic acid   Hypoxemia - Likely multifactorial in setting ?aspiration pneumonitis r/t AMS with underlying  COPD with continue tobacco use pta -- Cont to require 50%VM with sats 90-92%.  No distress.  PLAN -  -continue spiriva -continue symbicort -continue scheduled nebs for now but plan to taper to prn -wean O2 as able to keep sats 88-92% -may need home O2 -mucinex -outpt pulm f/u -f/u CXR in am   Protein calorie malnutrition -advance diet  Discussed at length with wife and daughter at bedside.  Hyponatremia, hypoxia and confusion need to be much improved prior to d/c.    Danford Bad, NP 02/25/2011  10:53 AM Pager: [phone]  *Care during the described time interval was provided by me and/or other providers on the critical care team. I have reviewed this patient's available data, including medical history, events of note, physical examination and test results as part of my evaluation. '  Pt  independently  seen and examined and available cxr's reviewed and I agree with above findings/ imp/ plan    Sandrea Hughs, MD Pulmonary and Critical Care Medicine [facility] Cell [phone]

## 2011-02-26 ENCOUNTER — Inpatient Hospital Stay (HOSPITAL_COMMUNITY): Payer: 59

## 2011-02-26 MED ORDER — ADULT MULTIVITAMIN W/MINERALS CH: 1.0000 | ORAL_TABLET | Freq: Every day | ORAL | Status: DC

## 2011-02-26 NOTE — Progress Notes (Signed)
Discharge instructions faxed to Dr. Marnee Guarneri office, fax: [phone].

## 2011-02-26 NOTE — Discharge Summary (Signed)
Physician Discharge Summary  Patient ID: Gerald Ho MRN: [id number] DOB/AGE: 07-26-1950 61 y.o.  Admit date: 02/22/2011 Discharge date: 02/26/2011    Discharge Diagnoses:  1. Hyponatremia 2. ETOH abuse 3. Altered mental status 4. Diastolic CHF, acute on chronic 5. Pulmonary edema, acute 6. Tobacco abuse 7. Hypomagnesemia / Hypophosphatemia    Brief Summary: [name] is a 61 y.o. y/o male, current smoker, and history of ETOH abuse transferred from [place] on 02/22/2011 with hyponatremia and AMS from beer potomania. Reports drinking 6 to 12 beers per day, and gets "shakes" if he doesn't drink. Noted to have HTN and pulmonary edema on CXR at [facility] and given lasix/NTG gtt.  Initial sodium was noted to be 100 and hypertonic saline was initiated for correction.  He was placed on folate, thiamine and multi-vitamin and CIWA Protocol for ETOH withdrawal.  Secondary to hypoxemic respiratory failure he was treated with supplemental O2, lasix in the setting of pulmonary edema, nebulized bronchodilators and Spiriva (home med) as he improved.  He was weaned off the nitroglycerin gtt and losartan was continued.  Electrolytes were serially monitored and repleted as needed. At time of discharge patients resting room air saturations were greater than 90%.  With ambulation of approximately 75 feet he did decrease to 76%.  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  However, after discussion he is agreeable to home O2.  He will need follow up BMP to evaluate electrolytes.  He was encouraged to discontinue further ETOH intake and reconnect with AA for meetings as he has had long periods of sobriety in the past.      Microbiology/Sepsis markers: 2/18 MRSA PCR>>>neg  Significant Diagnostic Studies:  2/19 Echo>>>The cavity size was normal. Wall thickness was increased in a pattern of mild LVH. There was mild concentric hypertrophy. Systolic  function was normal. The estimated ejection fraction was in the range of 55% to 60%. Wall motion was normal; there were no regional wallmotion abnormalities. Doppler parameters are consistent with abnormal left ventricular relaxation (grade 1 diastolic dysfunction). Mitral valve: Mildly calcified annulus. Left atrium: The atrium was mildly dilated   Discharge Exam: General - no distress but congested rattling cough  HEENT - no sinus tenderness  Cardiac - s1s2 regular, no murmur  Chest - b/l wheeze  Abd - soft, nontender  Ext - no edema  Neuro - calm, appropriate, follows commands     Discharge Labs  BMET  Lab 02/25/11 1030 02/25/11 0620 02/24/11 0600 02/23/11 1543 02/23/11 0335 02/22/11 2010  NA 128* 122* 115* 113* 110* --  K 3.7 3.6 -- -- -- --  CL 91* 88* 79* 76* 74* --  CO2 26 27 25 25 24  --  GLUCOSE 109* 103* 106* 132* 144* --  BUN 14 15 20 15 8  --  CREATININE 0.91 0.90 1.21 1.49* 0.79 --  CALCIUM 8.8 8.7 8.6 8.8 8.7 --  MG -- 2.3 2.3 -- -- 1.2*  PHOS -- 2.8 2.9 -- -- 2.2*     CBC   Lab 02/25/11 0620 02/24/11 0600 02/23/11 0335  HGB 13.5 14.3 14.5  HCT 37.3* 38.2* 37.4*  WBC 6.7 7.8 6.9  PLT 208 209 227   Anti-Coagulation  Lab 02/22/11 2010  INR 1.02      DISCHARGE MEDICATIONS:   Bayne, Fosnaugh  Home Medication Instructions [id number]   Printed on:02/26/11 1149  Medication Information  losartan (COZAAR) 50 MG tablet Take 50 mg by mouth daily.           RABEprazole (ACIPHEX) 20 MG tablet Take 20 mg by mouth 2 (two) times daily.           lamoTRIgine (LAMICTAL) 200 MG tablet Take 200 mg by mouth daily.           tiotropium (SPIRIVA) 18 MCG inhalation capsule Place 18 mcg into inhaler and inhale daily.           budesonide-formoterol (SYMBICORT) 160-4.5 MCG/ACT inhaler Inhale 2 puffs into the lungs 2 (two) times daily.           Multiple Vitamin (MULITIVITAMIN WITH MINERALS) TABS Take 1 tablet by mouth daily.                 Disposition: Discharge to home with self care / care of wife.  Pt will be set up for home O2 at 2L per [place] continuous.   Discharged Condition: [name] has met maximum benefit of inpatient care and is medically stable and cleared for discharge.  Patient is pending follow up as above.   See above for ambulation discussion.    Time spent on disposition:  Greater than 35 minutes.   Signed: Canary Brim, NP-C [facility] Pgr: [phone]    Pt independently  seen and examined and available cxr's reviewed and I agree with above findings/ imp/ plan  Sandrea Hughs, MD Pulmonary and Critical Care Medicine [facility] Cell [phone]

## 2011-02-26 NOTE — Progress Notes (Signed)
   CARE MANAGEMENT NOTE 02/26/2011  Patient:  Gerald Ho, Gerald Ho   Account Number:  [id number]  Date Initiated:  02/23/2011  Documentation initiated by:  [facility]  Subjective/Objective Assessment:   CHF - hyponatremia.     Action/Plan:   PTA, PT INDEPENDENT, LIVES WITH SPOUSE.   Anticipated DC Date:  02/26/2011   Anticipated DC Plan:  HOME/SELF CARE  In-house referral  Clinical Social Worker      DC Planning Services  CM consult      Choice offered to / List presented to:     DME arranged  [facility]      DME agency  [facility]        Status of service:  Completed, signed off Medicare Important Message given?   (If response is "NO", the following Medicare IM given date fields will be blank) Date Medicare IM given:   Date Additional Medicare IM given:    Discharge Disposition:  HOME W HOME HEALTH SERVICES  Per UR Regulation:  Reviewed for med. necessity/level of care/duration of stay  Comments:  02/26/11 Gerald Goga,RN,BSN 1230 PT'S O2 SATS 76% WITH AMBULATION.  PT WILL NEED HOME O2 SET UP.  REFERRAL TO [facility] FOR DME NEEDS.  PORTABLE O2 TANK TO BE DELIVERED TO PT'S ROOM PRIOR TO DISCHARGE.  PT NOT HAPPY ABOUT HAVING TO GO HOME ON OXYGEN.

## 2011-02-26 NOTE — Evaluation (Addendum)
Occupational Therapy Evaluation Patient Details Name: Gerald Ho MRN: [id number] DOB: 08-05-1950 Today's Date: 02/26/2011  Problem List:  Patient Active Problem List  Diagnoses  . Hyponatremia  . ETOH abuse  . Altered mental status  . Diastolic CHF, acute on chronic  . Pulmonary edema, acute    Past Medical History:  Past Medical History  Diagnosis Date  . Current smoker     2 packs per day   Past Surgical History: No past surgical history on file.  OT Assessment/Plan/Recommendation OT Assessment Clinical Impression Statement: Pt. admitted for hyponatremia in the setting to ETOH abuse. Pt near baseline functioning with necessary level of assist upon d/c. D/w pt the need to quit smoking and drinking (pt. had been sober for 2 years until this past summer)- along with this encouraged picking up a hobby as pt has none. Pt. reports he used to enjoy fishing. No further acute OT needs as pt is to d/c this afternoon. Signing off  OT Recommendation/Assessment: Patient does not need any further OT services OT Recommendation Follow Up Recommendations: No OT follow up Equipment Recommended: None recommended by OT  OT Evaluation Precautions/Restrictions  Restrictions Weight Bearing Restrictions: No Prior Functioning Home Living Lives With: Spouse Receives Help From: Family Type of Home: House Home Layout: One level Bathroom Shower/Tub: Walk-in shower;Door [employer] Toilet: Standard Home Adaptive Equipment: Built-in shower seat Prior Function Level of Independence: Independent with homemaking with ambulation;Independent with basic ADLs Able to Take Stairs?: Reciprically Driving: Yes Vocation: Full time employment Vocation Requirements: fork lift driver ADL ADL Eating/Feeding: Performed;Independent Where Assessed - Eating/Feeding: Edge of bed Grooming: Simulated;Independent Where Assessed - Grooming: Standing at sink Upper Body Bathing: Simulated;Modified independent Where  Assessed - Upper Body Bathing: Sitting, bed Lower Body Bathing: Simulated;Modified independent Where Assessed - Lower Body Bathing: Sit to stand from bed Upper Body Dressing: Simulated;Independent Where Assessed - Upper Body Dressing: Sitting, bed Lower Body Dressing: Simulated;Independent Where Assessed - Lower Body Dressing: Sit to stand from bed Toilet Transfer: Performed;Supervision/safety Toilet Transfer Details (indicate cue type and reason): S with ambulation as patient slightly unsteady- able to catch and right self with furniture  Toilet Transfer Method: Proofreader: Regular height toilet Toileting - Clothing Manipulation: Simulated;Independent Where Assessed - Toileting Clothing Manipulation: Standing Toileting - Hygiene: Simulated;Independent Where Assessed - Toileting Hygiene: Sit on 3-in-1 or toilet Tub/Shower Transfer: Simulated;Supervision/safety Tub/Shower Transfer Details (indicate cue type and reason): simulated in room. pt steady as long as he can place hand on something to support self Tub/Shower Transfer Method: Science writer: Shower seat with back Ambulation Related to ADLs: S with ambulation secondary to pt slightly unsteady ADL Comments: Pt near baseline functioning. However, O2 sats decreased to 86% on R/A with ambulation. PA present for this. Vision/Perception  Vision - History Patient Visual Report: No change from baseline Sensation/Coordination Sensation Light Touch: Appears Intact Coordination Gross Motor Movements are Fluid and Coordinated: Yes Fine Motor Movements are Fluid and Coordinated: Yes Extremity Assessment RUE Assessment RUE Assessment: Within Functional Limits LUE Assessment LUE Assessment: Within Functional Limits Mobility  Bed Mobility Supine to Sit: 7: Independent End of Session OT - End of Session Equipment Utilized During Treatment: Gait belt Activity Tolerance: Patient tolerated  treatment well O2 Sats: 88-91% at rest 86-90% with ambulation 94% with ambulation on 2L O2  Patient left: in bed;with call Purk in reach General Behavior During Session: [facility] for tasks performed Cognition: [facility] for tasks performed   Torion Hulgan 02/26/2011, 12:56 PM

## 2011-02-26 NOTE — Progress Notes (Signed)
Patient ambulated approx 75 ft off oxygen.  Saturations decreased to 76%.  Resting O2 sat 89-91%.

## 2011-02-26 NOTE — Progress Notes (Signed)
Discussed discharge instructions and medications with pt and wife. Pt showed no barriers to learning. IV's removed. Assessment unchanged from morning. Pt sent home with portable O2 tank.

## 2011-02-26 NOTE — Evaluation (Signed)
Physical Therapy Evaluation and Discharge Patient Details Name: Gerald Ho MRN: [id number] DOB: 1950-12-12 Today's Date: 02/26/2011  Problem List:  Patient Active Problem List  Diagnoses  . Hyponatremia  . ETOH abuse  . Altered mental status  . Diastolic CHF, acute on chronic  . Pulmonary edema, acute    Past Medical History:  Past Medical History  Diagnosis Date  . Current smoker     2 packs per day   Past Surgical History: No past surgical history on file.  PT Assessment/Plan/Recommendation PT Assessment Clinical Impression Statement: Pt presents with mild instability during gait but able to correct independently.  Pt instructed in use of incentive spirometer. 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  Pt presents with no further acute PT needs.  Acute PT signing offl.  PT Recommendation/Assessment: Patent does not need any further PT services No Skilled PT: All education completed PT Recommendation Follow Up Recommendations: No PT follow up Equipment Recommended: None recommended by PT PT Goals     PT Evaluation Precautions/Restrictions  Restrictions Weight Bearing Restrictions: No Prior Functioning  Home Living Lives With: Spouse Receives Help From: Family Type of Home: House Home Layout: One level Bathroom Shower/Tub: Walk-in shower;Door [employer] Toilet: Standard Home Adaptive Equipment: Built-in shower seat Prior Function Level of Independence: Independent with homemaking with ambulation;Independent with basic ADLs Able to Take Stairs?: Reciprically Driving: Yes Vocation: Full time employment Vocation Requirements: Chief Operating Officer Arousal/Alertness: Awake/alert Overall Cognitive Status: Appears within functional limits for tasks assessed Sensation/Coordination Sensation Light Touch: Appears Intact Coordination Gross Motor Movements are Fluid and Coordinated: Yes Fine  Motor Movements are Fluid and Coordinated: Yes Extremity Assessment RUE Assessment RUE Assessment: Within Functional Limits LUE Assessment LUE Assessment: Within Functional Limits RLE Assessment RLE Assessment: Within Functional Limits LLE Assessment LLE Assessment: Within Functional Limits Mobility (including Balance) Bed Mobility Supine to Sit: 7: Independent Transfers Transfers: Yes Sit to Stand: 7: Independent;From bed Stand to Sit: 7: Independent;To bed;Without upper extremity assist Ambulation/Gait Ambulation/Gait: Yes Ambulation/Gait Assistance: 5: Supervision Ambulation/Gait Assistance Details (indicate cue type and reason): No significant LOB noted.  Pt Gerald little unsteady from time to time.  Pt's O2 sats dropped to 78 on room air.  Pr resistant to idea of using O2 at home  Ambulation Distance (Feet): 250 Feet Assistive device: None Gait Pattern: Within Functional Limits Stairs: No Wheelchair Mobility Wheelchair Mobility: No  Posture/Postural Control Posture/Postural Control: No significant limitations Balance Balance Assessed: Yes Static Standing Balance Rhomberg - Eyes Opened: 30  Exercise    End of Session PT - End of Session Equipment Utilized During Treatment: Gait belt Activity Tolerance: Patient tolerated treatment well;Other (comment) (O2 sats dropped during gait on Room air. ) Patient left: in bed;with call Racey in reach Nurse Communication: Mobility status for transfers;Mobility status for ambulation General Behavior During Session: Gerald Ho, [facility] for tasks performed Cognition: Gerald Ho for tasks performed  Gerald Ho 02/26/2011, 3:38 PM Gerald Ho L. Gerald Ho DPT [phone]

## 2011-02-26 NOTE — Progress Notes (Signed)
Pt's O2 level on Room Air sitting: 88% Pt's O2 level on 2L O2 sitting: 92%

## 2012-10-03 ENCOUNTER — Other Ambulatory Visit: Payer: 59 | Admitting: *Deleted

## 2012-10-03 ENCOUNTER — Other Ambulatory Visit: Payer: Self-pay | Admitting: *Deleted

## 2012-10-03 DIAGNOSIS — F17200 Nicotine dependence, unspecified, uncomplicated: Secondary | ICD-10-CM

## 2012-10-03 DIAGNOSIS — J449 Chronic obstructive pulmonary disease, unspecified: Secondary | ICD-10-CM | POA: Insufficient documentation

## 2012-10-05 ENCOUNTER — Ambulatory Visit
Admission: RE | Admit: 2012-10-05 | Discharge: 2012-10-05 | Disposition: A | Discharge status: Home / Self Care | Payer: No Typology Code available for payment source | Source: Ambulatory Visit | Attending: Emergency Medicine | Admitting: Emergency Medicine

## 2012-10-05 DIAGNOSIS — F17200 Nicotine dependence, unspecified, uncomplicated: Secondary | ICD-10-CM

## 2012-10-06 ENCOUNTER — Telehealth: Payer: Self-pay | Admitting: Emergency Medicine

## 2012-10-06 NOTE — Telephone Encounter (Signed)
I spoke with Dr. Carmelina Noun. She stated pt had CT scan done and it showed some worrisome areas in the left upper lobe. rec 3 month follow up for this. I do not see where we have seen pt in the office but the order states RB ordered this on pt. Please advise Dr. Delton Coombes thanks

## 2012-10-09 NOTE — Telephone Encounter (Signed)
This is a low-dose screening scan, will need to be repeated vs move immediately to PET or super D scan. I will discuss with Willette Pa on 10/7 and we will enact plan and tracking.

## 2012-10-10 ENCOUNTER — Telehealth: Payer: Self-pay | Admitting: *Deleted

## 2012-10-10 ENCOUNTER — Other Ambulatory Visit: Payer: Self-pay | Admitting: *Deleted

## 2012-10-10 DIAGNOSIS — R918 Other nonspecific abnormal finding of lung field: Secondary | ICD-10-CM | POA: Insufficient documentation

## 2012-10-10 NOTE — Telephone Encounter (Signed)
Discussed with Gerald Ho. Pt will be contacted. I recommend PET scan as next step in the evaluation

## 2012-10-10 NOTE — Telephone Encounter (Signed)
Spoke with Dr. Delton Coombes today regarding LDCT scan.  He recommends the pt receive PET scan.  I relayed information to pt. Next step for pt is to get PEt scan and have further evaluation.  Pt verbalized understanding.

## 2012-10-23 ENCOUNTER — Telehealth: Payer: Self-pay | Admitting: *Deleted

## 2012-10-23 NOTE — Telephone Encounter (Signed)
Called to check on pt and follow up on changing of PET scan.  I left vm message to call Annabelle Harman with my phone number

## 2012-10-25 ENCOUNTER — Encounter (HOSPITAL_COMMUNITY): Payer: 59

## 2012-10-27 ENCOUNTER — Telehealth: Payer: Self-pay | Admitting: *Deleted

## 2012-10-27 ENCOUNTER — Encounter: Payer: Self-pay | Admitting: *Deleted

## 2012-10-27 NOTE — Telephone Encounter (Signed)
Called pt regarding updating PCP.  Pt requested PCP made aware of LDCT.  I contacted Dr. Jeanie Sewer and fax radiology report to office.  I stated pulmonologist will follow up with pt.

## 2012-10-27 NOTE — Telephone Encounter (Signed)
Called pt regarding appt with Dr. Delton Coombes 11/03/12 at 2:00.  He would like email with Dr. Kavin Leech address.  I will send.

## 2012-10-27 NOTE — Progress Notes (Unsigned)
Called Dr. Alcario Drought office. Left vm message regarding pt LDCT sing

## 2012-10-27 NOTE — Progress Notes (Signed)
Spoke with Dr. Jeanie Sewer regarding LDCT screening.  He thanked me for phone call and will fax radiology report to his office.

## 2012-11-03 ENCOUNTER — Encounter: Payer: Self-pay | Admitting: Emergency Medicine

## 2012-11-03 ENCOUNTER — Ambulatory Visit (INDEPENDENT_AMBULATORY_CARE_PROVIDER_SITE_OTHER): Payer: 59 | Admitting: Emergency Medicine

## 2012-11-03 ENCOUNTER — Other Ambulatory Visit: Payer: 59

## 2012-11-03 VITALS — BP 132/70 | HR 80 | Ht 72.0 in | Wt 194.8 lb

## 2012-11-03 DIAGNOSIS — F17200 Nicotine dependence, unspecified, uncomplicated: Secondary | ICD-10-CM

## 2012-11-03 DIAGNOSIS — J810 Acute pulmonary edema: Secondary | ICD-10-CM

## 2012-11-03 DIAGNOSIS — R918 Other nonspecific abnormal finding of lung field: Secondary | ICD-10-CM

## 2012-11-03 DIAGNOSIS — J449 Chronic obstructive pulmonary disease, unspecified: Secondary | ICD-10-CM

## 2012-11-03 DIAGNOSIS — J4489 Other specified chronic obstructive pulmonary disease: Secondary | ICD-10-CM

## 2012-11-03 DIAGNOSIS — R222 Localized swelling, mass and lump, trunk: Secondary | ICD-10-CM

## 2012-11-03 NOTE — Progress Notes (Signed)
HPI:  62 yo smoker (80 pk-yrs), hx HTN, CHF, COPD dx in 2000. Prior admission for beer potomania and hyponatremia. Underwent screening LDCT on 10/06/12 for high risk pt. He has exertional dyspnea. He is using symbicort and spiriva depending on when he can get sample and how much work he is doing. His LDCT shows a 1.3cm LUL spiculated nodule. Here to discuss the abnormal CT.   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 No known hx TB exposure.    Past Medical History  Diagnosis Date  . Current smoker     2 packs per day  . High blood pressure   . Emphysema   EtOH abuse Bipolar disease (per pt's report)  Family History  Problem Relation Age of Onset  . Emphysema Father      History   Social History  . Marital Status: Legally Separated    Spouse Name: N/A    Number of Children: N/A  . Years of Education: N/A   Occupational History  . Not on file.   Social History Main Topics  . Smoking status: Current Every Day Smoker -- 2.00 packs/day for 40 years    Types: Cigarettes  . Smokeless tobacco: Not on file  . Alcohol Use: 0.0 oz/week  . Drug Use: Not on file  . Sexual Activity: No   Other Topics Concern  . Not on file   Social History Narrative  . No narrative on file  has worked in [employer] yard, not now    No Known Allergies   Outpatient Prescriptions Prior to Visit  Medication Sig Dispense Refill  . budesonide-formoterol (SYMBICORT) 160-4.5 MCG/ACT inhaler Inhale 2 puffs into the lungs 2 (two) times daily.      Marland Kitchen lamoTRIgine (LAMICTAL) 200 MG tablet Take 200 mg by mouth daily.      Marland Kitchen losartan (COZAAR) 50 MG tablet Take 50 mg by mouth daily.      . Multiple Vitamin (MULITIVITAMIN WITH MINERALS) TABS Take 1 tablet by mouth daily.  30 tablet  3  . RABEprazole (ACIPHEX) 20 MG tablet Take 20 mg by mouth 2 (two) times daily.      Marland Kitchen tiotropium (SPIRIVA) 18 MCG inhalation capsule Place 18 mcg into inhaler and inhale daily.       No facility-administered medications prior to  visit.    Filed Vitals:   11/03/12 1425  BP: 132/70  Pulse: 80  Height: 6' (1.829 m)  Weight: 194 lb 12.8 oz (88.361 kg)  SpO2: 93%   Gen: Pleasant, well-nourished, in no distress,  normal affect  ENT: No lesions,  mouth clear,  oropharynx clear, no postnasal drip  Neck: No JVD, no TMG, no carotid bruits  Lungs: No use of accessory muscles, distant, clear without rales or rhonchi  Cardiovascular: RRR, heart sounds normal, no murmur or gallops, no peripheral edema  Musculoskeletal: No deformities, no cyanosis or clubbing  Neuro: alert, non focal  Skin: Warm, no lesions or rashes    10/06/12 --  CT CHEST SCREENING WITHOUT CONTRAST  TECHNIQUE:  Multidetector CT imaging of the chest was performed following the  standard low-dose protocol without IV contrast.  COMPARISON: None.  FINDINGS:  Mediastinal lymph nodes measure up to 10 mm anterior to the right  mainstem bronchus. Hilar regions are difficult to definitively  evaluate without IV contrast. No axillary adenopathy.  Atherosclerotic calcification the arterial vasculature, including  extensive involvement of the coronary arteries. Heart size normal.  No pericardial effusion.  Biapical pleural  parenchymal scarring. Emphysema. Subpleural nodular  opacification in the superior segment right lower lobe is seen  adjacent to a focal bed of emphysema, measuring approximately 1.0 x  2.4 cm (series 3, image 131). A spiculated nodule in the apical  posterior segment left upper lobe measures 1.3 cm (0.9 x 1.7 cm,  series 3, image 74). No pleural fluid. Airway is unremarkable.  Incidental imaging of the upper abdomen shows no acute findings. No  worrisome lytic or sclerotic lesions. Degenerative changes are seen  in the spine. Lower scratch 9 lower thoracic or upper lumbar  compression fracture is age indeterminate. There is mild superior  endplate compression involving upper thoracic vertebral body.  IMPRESSION:  1.  Spiculated left upper lobe nodule. Lung-RADS Category 4A,  suspicious. Follow up low-dose chest CT without contrast (please use  the following order, "CT chest low-dose screening follow-up") is  recommended in 3 months. Alternatively, PET may be considered if a  more aggressive approach is desired. These results were called by  telephone at the time of interpretation on 10/06/2012 at 9:46 a.m. to  Dr. Leslye Peer nurse, Mindy , who verbally acknowledged these  results.  2. Focal area of nodular opacification in the superior segment right  lower lobe, adjacent to a focal bed of emphysema. This can be  re-evaluated on short-term followup imaging.  3. Borderline enlarged mediastinal lymph node, anterior to the right  mainstem bronchus.  4. Extensive coronary artery calcification.  5. Scattered compression deformities in the spine are age  indeterminate.   Mass of lung PET scan scheduled for 11/17, will review after completed. May be a candidate for resection. Cleda Daub today to see if he could tolerate.   COPD (chronic obstructive pulmonary disease) - continue spiriva and symbicort, give samples - spiro today - discussed smoking cessation

## 2012-11-03 NOTE — Patient Instructions (Signed)
Blood work today Spirometry today Continue your inhaled medications as you are taking them Get your PET scan as planned Follow with Dr Delton Coombes after your PET scan to review the results.

## 2012-11-03 NOTE — Assessment & Plan Note (Signed)
PET scan scheduled for 11/17, will review after completed. May be a candidate for resection. Cleda Daub today to see if he could tolerate.

## 2012-11-03 NOTE — Assessment & Plan Note (Signed)
-   continue spiriva and symbicort, give samples - spiro today - discussed smoking cessation

## 2012-11-07 LAB — QUANTIFERON TB GOLD ASSAY (BLOOD)
Interferon Gamma Release Assay: NEGATIVE
Quantiferon Tb Ag Minus Nil Value: 0.16 IU/mL

## 2012-11-10 LAB — ALPHA-1 ANTITRYPSIN PHENOTYPE

## 2012-11-20 ENCOUNTER — Encounter (HOSPITAL_COMMUNITY): Payer: BC Managed Care – PPO

## 2012-12-12 ENCOUNTER — Ambulatory Visit: Payer: 59 | Admitting: Emergency Medicine

## 2013-01-08 ENCOUNTER — Encounter (HOSPITAL_COMMUNITY): Admission: RE | Admit: 2013-01-08 | Payer: BC Managed Care – PPO | Source: Ambulatory Visit

## 2013-01-11 ENCOUNTER — Ambulatory Visit (HOSPITAL_COMMUNITY)
Admission: RE | Admit: 2013-01-11 | Discharge: 2013-01-11 | Disposition: A | Discharge status: Home / Self Care | Payer: BC Managed Care – PPO | Source: Ambulatory Visit | Attending: Emergency Medicine | Admitting: Emergency Medicine

## 2013-01-11 DIAGNOSIS — J438 Other emphysema: Secondary | ICD-10-CM | POA: Insufficient documentation

## 2013-01-11 DIAGNOSIS — R911 Solitary pulmonary nodule: Secondary | ICD-10-CM | POA: Insufficient documentation

## 2013-01-11 DIAGNOSIS — I2510 Atherosclerotic heart disease of native coronary artery without angina pectoris: Secondary | ICD-10-CM | POA: Insufficient documentation

## 2013-01-11 DIAGNOSIS — I700 Atherosclerosis of aorta: Secondary | ICD-10-CM | POA: Insufficient documentation

## 2013-01-11 DIAGNOSIS — R918 Other nonspecific abnormal finding of lung field: Secondary | ICD-10-CM

## 2013-01-11 DIAGNOSIS — I7090 Unspecified atherosclerosis: Secondary | ICD-10-CM | POA: Insufficient documentation

## 2013-01-11 LAB — GLUCOSE, CAPILLARY: GLUCOSE-CAPILLARY: 89 mg/dL (ref 70–99)

## 2013-01-11 MED ORDER — FLUDEOXYGLUCOSE F - 18 (FDG) INJECTION
19.2000 | Freq: Once | INTRAVENOUS | Status: AC | PRN
  Administered 2013-01-11: 19.2 via INTRAVENOUS

## 2013-01-12 ENCOUNTER — Ambulatory Visit (INDEPENDENT_AMBULATORY_CARE_PROVIDER_SITE_OTHER): Payer: BC Managed Care – PPO | Admitting: Emergency Medicine

## 2013-01-12 ENCOUNTER — Encounter: Payer: Self-pay | Admitting: Emergency Medicine

## 2013-01-12 VITALS — BP 150/100 | HR 84 | Ht 72.0 in | Wt 210.2 lb

## 2013-01-12 DIAGNOSIS — R222 Localized swelling, mass and lump, trunk: Secondary | ICD-10-CM

## 2013-01-12 DIAGNOSIS — R918 Other nonspecific abnormal finding of lung field: Secondary | ICD-10-CM

## 2013-01-12 DIAGNOSIS — J449 Chronic obstructive pulmonary disease, unspecified: Secondary | ICD-10-CM

## 2013-01-12 NOTE — Assessment & Plan Note (Signed)
Nodule stable in size and not hypermetabolic on PET 7/0/92.  - needs repeat LDCT in 10/15

## 2013-01-12 NOTE — Progress Notes (Signed)
HPI:  63 yo smoker (35 pk-yrs), hx HTN, CHF, COPD dx in 2000. Prior admission for beer potomania and hyponatremia. Underwent screening LDCT on 10/06/12 for high risk pt. He has exertional dyspnea. He is using symbicort and spiriva depending on when he can get sample and how much work he is doing. His LDCT shows a 1.3cm LUL spiculated nodule. Here to discuss the abnormal CT.   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 No known hx TB exposure.   ROV 01/12/13 -- follows up for dyspnea/COPD, LUL nodule. PET performed 1/8 shows the LUL nodule is unchanged in size and is not hypermetabolic on PET. He was desaturated on walking into the office today. Still has cough prod of white. Worst when he lays down flat. He is on spiriva and symbicort > takes them when he able to afford; sounds like he is using symbicort more regularly than spiriva but neither of them reliably. He is hypertensive today. Medication compliance and cost is a huge issue here. He used to have oxygen but couldn't afford copay.     Filed Vitals:   01/12/13 1340  BP: 150/100  Pulse: 84  Height: 6' (1.829 m)  Weight: 210 lb 3.2 oz (95.346 kg)  SpO2: 93%   Gen: Pleasant, well-nourished, in no distress,  normal affect  ENT: No lesions,  mouth clear,  oropharynx clear, no postnasal drip  Neck: No JVD, no TMG, no carotid bruits  Lungs: No use of accessory muscles, distant, clear without rales or rhonchi  Cardiovascular: RRR, heart sounds normal, no murmur or gallops, no peripheral edema  Musculoskeletal: No deformities, no cyanosis or clubbing  Neuro: alert, non focal  Skin: Warm, no lesions or rashes    10/06/12 --  CT CHEST SCREENING WITHOUT CONTRAST  TECHNIQUE:  Multidetector CT imaging of the chest was performed following the  standard low-dose protocol without IV contrast.  COMPARISON: None.  FINDINGS:  Mediastinal lymph nodes measure up to 10 mm anterior to the right  mainstem bronchus. Hilar regions are difficult to  definitively  evaluate without IV contrast. No axillary adenopathy.  Atherosclerotic calcification the arterial vasculature, including  extensive involvement of the coronary arteries. Heart size normal.  No pericardial effusion.  Biapical pleural parenchymal scarring. Emphysema. Subpleural nodular  opacification in the superior segment right lower lobe is seen  adjacent to a focal bed of emphysema, measuring approximately 1.0 x  2.4 cm (series 3, image 131). A spiculated nodule in the apical  posterior segment left upper lobe measures 1.3 cm (0.9 x 1.7 cm,  series 3, image 74). No pleural fluid. Airway is unremarkable.  Incidental imaging of the upper abdomen shows no acute findings. No  worrisome lytic or sclerotic lesions. Degenerative changes are seen  in the spine. Lower scratch 9 lower thoracic or upper lumbar  compression fracture is age indeterminate. There is mild superior  endplate compression involving upper thoracic vertebral body.  IMPRESSION:  1. Spiculated left upper lobe nodule. Lung-RADS Category 4A,  suspicious. Follow up low-dose chest CT without contrast (please use  the following order, "CT chest low-dose screening follow-up") is  recommended in 3 months. Alternatively, PET may be considered if a  more aggressive approach is desired. These results were called by  telephone at the time of interpretation on 10/06/2012 at 9:46 a.m. to  Dr. Collene Gobble nurse, Mindy , who verbally acknowledged these  results.  2. Focal area of nodular opacification in the superior segment right  lower lobe,  adjacent to a focal bed of emphysema. This can be  re-evaluated on short-term followup imaging.  3. Borderline enlarged mediastinal lymph node, anterior to the right  mainstem bronchus.  4. Extensive coronary artery calcification.  5. Scattered compression deformities in the spine are age  indeterminate.  01/11/13 -- PET  COMPARISON: Chest CT 10/05/2012  FINDINGS:  NECK  No  hypermetabolic lymph nodes in the neck.  CHEST  Previously demonstrated nodule in the apex of the left upper lobe is  far less well-defined on today's examination, best demonstrated on  image 70 of series 2. However, there is a different technique (1.25  mm thick images on the prior study, and 3.75 mm thick images on  today's non breath held examination). Overall, the size of this  lesion is very similar to the prior study measuring approximately 17  x 8 mm on today's study. This lesion demonstrates no definite  hypermetabolism (SUVmax = 0.6) above and beyond that of the  background lung parenchyma (SUVmax = 0.6). No significant central  solid component. No hypermetabolic mediastinal or hilar nodes.  Background of moderate centrilobular and paraseptal emphysema and  mild diffuse bronchial wall thickening is redemonstrated. There is  atherosclerosis of the thoracic aorta, the great vessels of the  mediastinum and the coronary arteries, including calcified  atherosclerotic plaque in the left main, left anterior descending,  left circumflex and right coronary arteries.  ABDOMEN/PELVIS  No abnormal hypermetabolic activity within the liver, pancreas,  adrenal glands, or spleen. No hypermetabolic lymph nodes in the  abdomen or pelvis. Extensive atherosclerosis throughout the  abdominal and pelvic vasculature, without definite aneurysm. No  significant volume of ascites. No pneumoperitoneum. No pathologic  distention of small bowel.  SKELETON  No focal hypermetabolic activity to suggest skeletal metastasis.  IMPRESSION:  1. Today's study demonstrates a similar appearance of the  irregular-shaped predominantly ground-glass attenuation nodular  opacity in the apex of the left upper lobe, although direct  comparison between today's examination than the prior study is  limited by differences in technique. Regardless, this nodule  demonstrates no hypermetabolism on today's examination. Stability  in  size is reassuring, and followup evaluation with repeat low dose  chest CT in 1 year is recommended at this time.  2. Mild diffuse bronchial wall thickening with moderate  centrilobular and paraseptal emphysema; imaging findings suggestive  of underlying COPD.  3. Atherosclerosis, including left main and 3 vessel coronary artery  disease. Please note that although the presence of coronary artery  calcium documents the presence of coronary artery disease, the  severity of this disease and any potential stenosis cannot be  assessed on this non-gated CT examination. Assessment for potential  risk factor modification, dietary therapy or pharmacologic therapy  may be warranted, if clinically indicated. Medication    COPD (chronic obstructive pulmonary disease) Medication compliance a problem. He knows he needs O2 but cant do the copay.  - will look to get financial assistance if possible. Regimen is spiriva qd, symbicort bid, samples given - rov 6  Mass of lung Nodule stable in size and not hypermetabolic on PET 05/08/98.  - needs repeat LDCT in 10/15

## 2013-01-12 NOTE — Assessment & Plan Note (Signed)
Medication compliance a problem. He knows he needs O2 but cant do the copay.  - will look to get financial assistance if possible. Regimen is spiriva qd, symbicort bid, samples given - rov 6

## 2013-01-12 NOTE — Patient Instructions (Signed)
Your PET scan shows a stable nodule with no evidence for growth You will need a repeat low dose CT scan of the chest in October 2015 Continue your Symbicort twice a day You need to use your Spiriva every day - we will see if we can get financial assistance.  Follow with Dr Lamonte Sakai in 6 months or sooner if you have any problems

## 2013-09-04 ENCOUNTER — Encounter: Payer: Self-pay | Admitting: Emergency Medicine

## 2013-09-04 ENCOUNTER — Ambulatory Visit (INDEPENDENT_AMBULATORY_CARE_PROVIDER_SITE_OTHER): Payer: BC Managed Care – PPO | Admitting: Emergency Medicine

## 2013-09-04 VITALS — BP 130/80 | HR 87 | Ht 72.0 in | Wt 210.0 lb

## 2013-09-04 DIAGNOSIS — J4489 Other specified chronic obstructive pulmonary disease: Secondary | ICD-10-CM

## 2013-09-04 DIAGNOSIS — R918 Other nonspecific abnormal finding of lung field: Secondary | ICD-10-CM

## 2013-09-04 DIAGNOSIS — J449 Chronic obstructive pulmonary disease, unspecified: Secondary | ICD-10-CM

## 2013-09-04 DIAGNOSIS — R222 Localized swelling, mass and lump, trunk: Secondary | ICD-10-CM

## 2013-09-04 NOTE — Assessment & Plan Note (Signed)
Has been using Spiriva and Symbicort but with some irregularity. Discussed change to single agent.

## 2013-09-04 NOTE — Assessment & Plan Note (Signed)
He is due for a repeat LDCT chest in 10/15. Will order for 10/15 but if he has to pay then I will change to a regular CT

## 2013-09-04 NOTE — Progress Notes (Signed)
HPI:  63 yo smoker (59 pk-yrs), hx HTN, CHF, COPD dx in 2000. Prior admission for beer potomania and hyponatremia. Underwent screening LDCT on 10/06/12 for high risk pt. He has exertional dyspnea. He is using symbicort and spiriva depending on when he can get sample and how much work he is doing. His LDCT shows a 1.3cm LUL spiculated nodule. Here to discuss the abnormal CT.   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 No known hx TB exposure.   ROV 01/12/13 -- follows up for dyspnea/COPD, LUL nodule. PET performed 1/8 shows the LUL nodule is unchanged in size and is not hypermetabolic on PET. He was desaturated on walking into the office today. Still has cough prod of white. Worst when he lays down flat. He is on spiriva and symbicort > takes them when he able to afford; sounds like he is using symbicort more regularly than spiriva but neither of them reliably. He is hypertensive today. Medication compliance and cost is a huge issue here. He used to have oxygen but couldn't afford copay.   ROV 09/04/13 -- follows for her COPD and a left upper lobe nodule, negative on PET. Needs repeat CT scan 10/'15.  He is having trouble with a L parotid swelling and pain. He was treated with abx with some improvement. He is outside some, gardening. He is on spiriva, sometimes skips it. He also decreases his symbicort to qd. He is smoking 1pk/day. Not interested in quitting.     Filed Vitals:   09/04/13 0937  BP: 130/80  Pulse: 87  Height: 6' (1.829 m)  Weight: 210 lb (95.255 kg)  SpO2: 92%   Gen: Pleasant, well-nourished, in no distress,  normal affect  ENT: No lesions,  mouth clear,  oropharynx clear, no postnasal drip  Neck: No JVD, no TMG, no carotid bruits  Lungs: No use of accessory muscles, distant, clear without rales or rhonchi  Cardiovascular: RRR, heart sounds normal, no murmur or gallops, no peripheral edema  Musculoskeletal: No deformities, no cyanosis or clubbing  Neuro: alert, non  focal  Skin: Warm, no lesions or rashes    10/06/12 --  CT CHEST SCREENING WITHOUT CONTRAST  TECHNIQUE:  Multidetector CT imaging of the chest was performed following the  standard low-dose protocol without IV contrast.  COMPARISON: None.  FINDINGS:  Mediastinal lymph nodes measure up to 10 mm anterior to the right  mainstem bronchus. Hilar regions are difficult to definitively  evaluate without IV contrast. No axillary adenopathy.  Atherosclerotic calcification the arterial vasculature, including  extensive involvement of the coronary arteries. Heart size normal.  No pericardial effusion.  Biapical pleural parenchymal scarring. Emphysema. Subpleural nodular  opacification in the superior segment right lower lobe is seen  adjacent to a focal bed of emphysema, measuring approximately 1.0 x  2.4 cm (series 3, image 131). A spiculated nodule in the apical  posterior segment left upper lobe measures 1.3 cm (0.9 x 1.7 cm,  series 3, image 74). No pleural fluid. Airway is unremarkable.  Incidental imaging of the upper abdomen shows no acute findings. No  worrisome lytic or sclerotic lesions. Degenerative changes are seen  in the spine. Lower scratch 9 lower thoracic or upper lumbar  compression fracture is age indeterminate. There is mild superior  endplate compression involving upper thoracic vertebral body.  IMPRESSION:  1. Spiculated left upper lobe nodule. Lung-RADS Category 4A,  suspicious. Follow up low-dose chest CT without contrast (please use  the following order, "CT chest  low-dose screening follow-up") is  recommended in 3 months. Alternatively, PET may be considered if a  more aggressive approach is desired. These results were called by  telephone at the time of interpretation on 10/06/2012 at 9:46 a.m. to  Dr. Collene Gobble nurse, Mindy , who verbally acknowledged these  results.  2. Focal area of nodular opacification in the superior segment right  lower lobe, adjacent  to a focal bed of emphysema. This can be  re-evaluated on short-term followup imaging.  3. Borderline enlarged mediastinal lymph node, anterior to the right  mainstem bronchus.  4. Extensive coronary artery calcification.  5. Scattered compression deformities in the spine are age  indeterminate.  01/11/13 -- PET  COMPARISON: Chest CT 10/05/2012  FINDINGS:  NECK  No hypermetabolic lymph nodes in the neck.  CHEST  Previously demonstrated nodule in the apex of the left upper lobe is  far less well-defined on today's examination, best demonstrated on  image 70 of series 2. However, there is a different technique (1.25  mm thick images on the prior study, and 3.75 mm thick images on  today's non breath held examination). Overall, the size of this  lesion is very similar to the prior study measuring approximately 17  x 8 mm on today's study. This lesion demonstrates no definite  hypermetabolism (SUVmax = 0.6) above and beyond that of the  background lung parenchyma (SUVmax = 0.6). No significant central  solid component. No hypermetabolic mediastinal or hilar nodes.  Background of moderate centrilobular and paraseptal emphysema and  mild diffuse bronchial wall thickening is redemonstrated. There is  atherosclerosis of the thoracic aorta, the great vessels of the  mediastinum and the coronary arteries, including calcified  atherosclerotic plaque in the left main, left anterior descending,  left circumflex and right coronary arteries.  ABDOMEN/PELVIS  No abnormal hypermetabolic activity within the liver, pancreas,  adrenal glands, or spleen. No hypermetabolic lymph nodes in the  abdomen or pelvis. Extensive atherosclerosis throughout the  abdominal and pelvic vasculature, without definite aneurysm. No  significant volume of ascites. No pneumoperitoneum. No pathologic  distention of small bowel.  SKELETON  No focal hypermetabolic activity to suggest skeletal metastasis.  IMPRESSION:  1.  Today's study demonstrates a similar appearance of the  irregular-shaped predominantly ground-glass attenuation nodular  opacity in the apex of the left upper lobe, although direct  comparison between today's examination than the prior study is  limited by differences in technique. Regardless, this nodule  demonstrates no hypermetabolism on today's examination. Stability in  size is reassuring, and followup evaluation with repeat low dose  chest CT in 1 year is recommended at this time.  2. Mild diffuse bronchial wall thickening with moderate  centrilobular and paraseptal emphysema; imaging findings suggestive  of underlying COPD.  3. Atherosclerosis, including left main and 3 vessel coronary artery  disease. Please note that although the presence of coronary artery  calcium documents the presence of coronary artery disease, the  severity of this disease and any potential stenosis cannot be  assessed on this non-gated CT examination. Assessment for potential  risk factor modification, dietary therapy or pharmacologic therapy  may be warranted, if clinically indicated. Medication    COPD (chronic obstructive pulmonary disease) Has been using Spiriva and Symbicort but with some irregularity. Discussed change to single agent.   Mass of lung He is due for a repeat LDCT chest in 10/15. Will order for 10/15 but if he has to pay then I will change to a  regular CT

## 2013-09-04 NOTE — Patient Instructions (Signed)
Please stop Spiriva and Symbicort for now We will start anoro one inhalation daily We discussed smoking today - please let us know when you are ready to cut down or stop.  We will repeat your Ct scan of the chest in October Follow with Dr Lamonte Sakai in 1 month after your Ct scan to discuss.

## 2013-09-28 ENCOUNTER — Other Ambulatory Visit: Payer: Self-pay | Admitting: *Deleted

## 2013-09-28 MED ORDER — UMECLIDINIUM-VILANTEROL 62.5-25 MCG/INH IN AEPB: 1.0000 | INHALATION_SPRAY | Freq: Every day | RESPIRATORY_TRACT | Status: DC

## 2013-12-03 ENCOUNTER — Ambulatory Visit (INDEPENDENT_AMBULATORY_CARE_PROVIDER_SITE_OTHER)
Admission: RE | Admit: 2013-12-03 | Discharge: 2013-12-03 | Disposition: A | Discharge status: Home / Self Care | Payer: BC Managed Care – PPO | Source: Ambulatory Visit | Attending: Emergency Medicine | Admitting: Emergency Medicine

## 2013-12-03 ENCOUNTER — Telehealth: Payer: Self-pay

## 2013-12-03 DIAGNOSIS — R918 Other nonspecific abnormal finding of lung field: Secondary | ICD-10-CM

## 2013-12-03 NOTE — Telephone Encounter (Signed)
Radiology called, states that pt had ct chest this morning, RLL subsolid nodule stable to minimal increase in size.  Lung rads cat 3, probably benign.  Radiologist recommends f/u in 6 months with low dose ct chest w/o contrast.  Forwarding to RB just as a fyi.

## 2013-12-04 NOTE — Telephone Encounter (Signed)
Please let the patient know that there has been very little change in his previously identified pulmonary nodule. Radiology and I recommend that he have a repeat Ct scan in 6 months to insure stability. Thanks.

## 2013-12-05 ENCOUNTER — Encounter: Payer: Self-pay | Admitting: Emergency Medicine

## 2013-12-05 ENCOUNTER — Ambulatory Visit: Payer: BC Managed Care – PPO | Admitting: Emergency Medicine

## 2013-12-05 VITALS — BP 142/90 | HR 87 | Ht 72.0 in | Wt 217.2 lb

## 2013-12-05 DIAGNOSIS — J449 Chronic obstructive pulmonary disease, unspecified: Secondary | ICD-10-CM

## 2013-12-05 DIAGNOSIS — R918 Other nonspecific abnormal finding of lung field: Secondary | ICD-10-CM

## 2013-12-05 MED ORDER — ALBUTEROL SULFATE HFA 108 (90 BASE) MCG/ACT IN AERS: 2.0000 | INHALATION_SPRAY | Freq: Four times a day (QID) | RESPIRATORY_TRACT | Status: DC | PRN

## 2013-12-05 NOTE — Telephone Encounter (Signed)
Patient notified of results at [place] today.

## 2013-12-05 NOTE — Assessment & Plan Note (Signed)
Progressive exertional dyspnea, probably with hypoxemia. He continues to smoke although he has cut down.  Please continue your Anoro daily Use albuterol 2 puffs as needed for shortness of breath Walking oximetry today Congratulations on decreasing your smoking! Continue to work on stopping  Follow with Dr Lamonte Sakai in 3 months or sooner if you have any problems.

## 2013-12-05 NOTE — Progress Notes (Signed)
HPI:  63 yo smoker (64 pk-yrs), hx HTN, CHF, COPD dx in 2000. Prior admission for beer potomania and hyponatremia. Underwent screening LDCT on 10/06/12 for high risk pt. He has exertional dyspnea. He is using symbicort and spiriva depending on when he can get sample and how much work he is doing. His LDCT shows a 1.3cm LUL spiculated nodule. Here to discuss the abnormal CT.   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 No known hx TB exposure.   ROV 01/12/13 -- follows up for dyspnea/COPD, LUL nodule. PET performed 1/8 shows the LUL nodule is unchanged in size and is not hypermetabolic on PET. He was desaturated on walking into the office today. Still has cough prod of white. Worst when he lays down flat. He is on spiriva and symbicort > takes them when he able to afford; sounds like he is using symbicort more regularly than spiriva but neither of them reliably. He is hypertensive today. Medication compliance and cost is a huge issue here. He used to have oxygen but couldn't afford copay.   ROV 09/04/13 -- follows for her COPD and a left upper lobe nodule, negative on PET. Needs repeat CT scan 10/'15.  He is having trouble with a L parotid swelling and pain. He was treated with abx with some improvement. He is outside some, gardening. He is on spiriva, sometimes skips it. He also decreases his symbicort to qd. He is smoking 1pk/day. Not interested in quitting.   ROV 12/05/13 -- follows for his COPD, Also with a LUL nodule that we have followed on LDCT. Most recent scan 12/03/13 showed the LUL nodule was smaller, there is stable to slightly larger nodule in the RLL. He is having more exertional SOB. Last time we changed spiriva and symbicort to Anoro. He is desaturated on RA today.      Filed Vitals:   12/05/13 1516  BP: 142/90  Pulse: 87  Height: 6' (1.829 m)  Weight: 217 lb 3.2 oz (98.521 kg)  SpO2: 86%   Gen: Pleasant, well-nourished, in no distress,  normal affect  ENT: No lesions,  mouth clear,   oropharynx clear, no postnasal drip  Neck: No JVD, no TMG, no carotid bruits  Lungs: No use of accessory muscles, distant, clear without rales or rhonchi  Cardiovascular: RRR, heart sounds normal, no murmur or gallops, no peripheral edema  Musculoskeletal: No deformities, no cyanosis or clubbing  Neuro: alert, non focal  Skin: Warm, no lesions or rashes   12/03/13 --  COMPARISON: PET 01/11/2013 and CT chest 10/05/2012.  FINDINGS: Mediastinal lymph nodes measure up to 1.6 cm in the lower right paratracheal station (previously 1.0 cm). Subcarinal lymph node measures 1.8 cm (previously 0.7 cm). Hilar regions are difficult to definitively evaluate without IV contrast but appear grossly unremarkable. Axillary lymph nodes are not enlarged by CT size criteria. Pulmonary arteries are borderline enlarged. Heart is mildly enlarged. Extensive 3 vessel coronary artery calcification. No pericardial effusion.  Moderate to severe centrilobular emphysema. There is a paraseptal component as well. A sub solid subpleural nodule in the superior segment right lower lobe measures approximately 1.4 x 2.1 cm (series 5, image 197), compared to 1.0 x 2.4 cm on 10/05/2012. Irregular left upper lobe nodule appears slightly less prominent and is slightly different in configuration, with maximum transaxial measurements of 0.7 x 0.9 cm (image 98), previously 0.9 x 1.7 cm. No new pulmonary nodules. Basilar septal thickening and ground-glass with tiny bilateral pleural effusions, new. Airway  is unremarkable.  Incidental imaging of the upper abdomen shows the visualized portions of the liver, adrenal glands, spleen and stomach to be grossly unremarkable.  No worrisome lytic or sclerotic lesions. Degenerative changes are seen in the spine. Upper lumbar compression fracture is unchanged. Flowing anterior osteophytosis in the thoracic spine.  IMPRESSION: 1. Right lower lobe sub solid nodule is stable  to minimally increased in size in the interval. Lung-RADS Category 3, probably benign findings. Short-term follow up in 6 months is recommended with low-dose chest CT without contrast (please use the following order, "CT chest low-dose screening follow-up"). These results will be called to the ordering clinician or representative by the Radiologist Assistant, and communication documented in the PACS or zVision Dashboard. 2. Mild congestive heart failure. 3. Mediastinal adenopathy is most likely reactive, in the setting of congestive heart failure. 4. Borderline enlarged pulmonary arteries and extensive 3 vessel coronary artery calcification.   COPD (chronic obstructive pulmonary disease) Progressive exertional dyspnea, probably with hypoxemia. He continues to smoke although he has cut down.  Please continue your Anoro daily Use albuterol 2 puffs as needed for shortness of breath Walking oximetry today Congratulations on decreasing your smoking! Continue to work on stopping  Follow with Dr Lamonte Sakai in 3 months or sooner if you have any problems.  Mass of lung Left upper lobe nodule has decreased in size but he does have other smaller nodules including a right lower lobe superior segmental nodule that may be slightly larger. This was read as a RADS 3 scan. Repeat in 6 months

## 2013-12-05 NOTE — Assessment & Plan Note (Signed)
Left upper lobe nodule has decreased in size but he does have other smaller nodules including a right lower lobe superior segmental nodule that may be slightly larger. This was read as a RADS 3 scan. Repeat in 6 months

## 2013-12-05 NOTE — Patient Instructions (Signed)
Please continue your Anoro daily Use albuterol 2 puffs as needed for shortness of breath Walking oximetry today Congratulations on decreasing your smoking! Continue to work on stopping  Follow with Dr Lamonte Sakai in 3 months or sooner if you have any problems.

## 2013-12-11 ENCOUNTER — Encounter: Payer: Self-pay | Admitting: Cardiovascular Disease

## 2013-12-11 ENCOUNTER — Ambulatory Visit (INDEPENDENT_AMBULATORY_CARE_PROVIDER_SITE_OTHER): Payer: BC Managed Care – PPO | Admitting: Cardiovascular Disease

## 2013-12-11 VITALS — BP 136/82 | HR 114 | Ht 72.0 in | Wt 209.1 lb

## 2013-12-11 DIAGNOSIS — I4891 Unspecified atrial fibrillation: Secondary | ICD-10-CM

## 2013-12-11 MED ORDER — DILTIAZEM HCL ER COATED BEADS 120 MG PO CP24: 120.0000 mg | ORAL_CAPSULE | Freq: Every day | ORAL | Status: DC

## 2013-12-11 MED ORDER — WARFARIN SODIUM 5 MG PO TABS: 5.0000 mg | ORAL_TABLET | Freq: Every day | ORAL | Status: DC
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
## 2013-12-11 NOTE — Patient Instructions (Signed)
Your physician has recommended you make the following change in your medication:    CARDIZEM 120MG  ONCE A DAY   COUMADIN 5MG  EVERYDAY  EXCEPT ON Monday AND Thursday YOU WILL TAKE 2.5 MG (HALF A TABLET )   Your physician has requested that you have an echocardiogram. Echocardiography is a painless test that uses sound waves to create images of your heart. It provides your doctor with information about the size and shape of your heart and how well your heart's chambers and valves are working. This procedure takes approximately one hour. There are no restrictions for this procedure.   NEEDS APPOINTMENT  FOR COUMADIN CLINIC IN ONE WEEK   Your physician recommends that you schedule a follow-up appointment in:  WITH  DR Shelbie Ammons  IN 6 WEEKS               Coumadin (Warfarin): What You Need to Know Warfarin is an anticoagulant. Anticoagulants help prevent the formation of blood clots. They also help stop the growth of blood clots. Warfarin is sometimes referred to as a "blood thinner."  Normally, when body tissues are cut or damaged, the blood clots in order to prevent blood loss. Sometimes clots form inside your blood vessels and obstruct the flow of blood through your circulatory system (thrombosis). These clots may travel through your bloodstream and become lodged in smaller blood vessels in your brain, which can cause a stroke, or in your lungs (pulmonary embolism). WHO SHOULD USE WARFARIN? Warfarin is prescribed for people at risk of developing harmful blood clots:  People with surgically implanted mechanical heart valves, irregular heart rhythms called atrial fibrillation, and certain clotting disorders.  People who have developed harmful blood clotting in the past, including those who have had a stroke or a pulmonary embolism, or thrombosis in their legs (deep vein thrombosis [DVT]).  People with an existing blood clot, such as a pulmonary embolism. WARFARIN DOSING Warfarin  tablets come in different strengths. Each tablet strength is a different color, with the amount of warfarin (in milligrams) clearly printed on the tablet. If the color of your tablet is different than usual when you receive a new prescription, report it immediately to your pharmacist or health care provider. WARFARIN MONITORING The goal of warfarin therapy is to lessen the clotting tendency of blood but not prevent clotting completely. Your health care provider will monitor the anticoagulation effect of warfarin closely and adjust your dose as needed. For your safety, blood tests called prothrombin time (PT) or international normalized ratio (INR) are used to measure the effects of warfarin. Both of these tests can be done with a finger stick or a blood draw. The longer it takes the blood to clot, the higher the PT or INR. Your health care provider will inform you of your "target" PT or INR range. If, at any time, your PT or INR is above the target range, there is a risk of bleeding. If your PT or INR is below the target range, there is a risk of clotting. Whether you are started on warfarin while you are in the hospital or in your health care provider's office, you will need to have your PT or INR checked within one week of starting the medicine. Initially, some people are asked to have their PT or INR checked as much as twice a week. Once you are on a stable maintenance dose, the PT or INR is checked less often, usually once every 2 to 4 weeks. The warfarin dose may  be adjusted if the PT or INR is not within the target range. It is important to keep all laboratory and health care provider follow-up appointments. Not keeping appointments could result in a chronic or permanent injury, pain, or disability because warfarin is a medicine that requires close monitoring. WHAT ARE THE SIDE EFFECTS OF WARFARIN?  Too much warfarin can cause bleeding (hemorrhage) from any part of the body. This may include bleeding  from the gums, blood in the urine, bloody or dark stools, a nosebleed that is not easily stopped, coughing up blood, or vomiting blood.  Too little warfarin can increase the risk of blood clots.  Too little or too much warfarin can also increase the risk of a stroke.  Warfarin use may cause a skin rash or irritation, an unusual fever, continual nausea or stomach upset, or severe pain in your joints or back. SPECIAL PRECAUTIONS WHILE TAKING WARFARIN Warfarin should be taken exactly as directed. It is very important to take warfarin as directed since bleeding or blood clots could result in chronic or permanent injury, pain, or disability.  Take your medicine at the same time every day. If you forget to take your dose, you can take it if it is within 6 hours of when it was due.  Do not change the dose of warfarin on your own to make up for missed or extra doses.  If you miss more than 2 doses in a row, you should contact your health care provider for advice. Avoid situations that cause bleeding. You may have a tendency to bleed more easily than usual while taking warfarin. The following actions can limit bleeding:  Using a softer toothbrush.  Flossing with waxed floss rather than unwaxed floss.  Shaving with an Copy rather than a blade.  Limiting the use of sharp objects.  Avoiding potentially harmful activities, such as contact sports. Warfarin and Pregnancy or Breastfeeding  Warfarin is not advised during the first trimester of pregnancy due to an increased risk of birth defects. In certain situations, a woman may take warfarin after her first trimester of pregnancy. A woman who becomes pregnant or plans to become pregnant while taking warfarin should notify her health care provider immediately.  Although warfarin does not pass into breast milk, a woman who wishes to breastfeed while taking warfarin should also consult with her health care provider. Alcohol, Smoking, and  Illicit Drug Use  Alcohol affects how warfarin works in the body. It is best to avoid alcoholic drinks or consume very small amounts while taking warfarin. In general, alcohol intake should be limited to 1 oz (30 mL) of liquor, 6 oz (180 mL) of wine, or 12 oz (360 mL) of beer each day. Notify your health care provider if you change your alcohol intake.  Smoking affects how warfarin works. It is best to avoid smoking while taking warfarin. Notify your health care provider if you change your smoking habits.  It is best to avoid all illicit drugs while taking warfarin since there are few studies that show how warfarin interacts with these drugs. Other Medicines and Dietary Supplements Many prescription and over-the-counter medicines can interfere with warfarin. Be sure all of your health care providers know you are taking warfarin. Notify your health care provider who prescribed warfarin for you or your pharmacist before starting or stopping any new medicines, including over-the-counter vitamins, dietary supplements, and pain medicines. Your warfarin dose may need to be adjusted. Some common over-the-counter medicines that may increase the  risk of bleeding while taking warfarin include:   Acetaminophen.  Aspirin.  Nonsteroidal anti-inflammatory medicines (NSAIDs), such as ibuprofen or naproxen.  Vitamin E. Dietary Considerations  Foods that have moderate or high amounts of vitamin K can interfere with warfarin. Avoid major changes in your diet or notify your health care provider before changing your diet. Eat a consistent amount of foods that have moderate or high amounts of vitamin K. Eating less foods containing vitamin K can increase the risk of bleeding. Eating more foods containing vitamin K can increase the risk of blood clots. Additional questions about dietary considerations can be discussed with a dietitian. Foods that are very high in vitamin K:  Greens, such as Swiss chard and beet,  collard, mustard, or turnip greens (fresh or frozen, cooked).  Kale (fresh or frozen, cooked).  Parsley (raw).  Spinach (cooked). Foods that are high in vitamin K:  Asparagus (frozen, cooked).  Beans, green (frozen, cooked).  Broccoli.  Bok choy (cooked).  Brussels sprouts (fresh or frozen, cooked).  Cabbage (cooked).   Coleslaw. Foods that are moderately high in vitamin K:  Blueberries.  Black-eyed peas.  Endive (raw).  Green leaf lettuce (raw).  Green scallions (raw).  Kale (raw).  Okra (frozen, cooked).  Plantains (fried).  Romaine lettuce (raw).  Sauerkraut (canned).  Spinach (raw). CALL YOUR CLINIC OR HEALTH CARE PROVIDER IF YOU:  Plan to have any surgery or procedure.  Feel sick, especially if you have diarrhea or vomiting.  Experience or anticipate any major changes in your diet.  Start or stop a prescription or over-the-counter medicine.  Become, plan to become, or think you may be pregnant.  Are having heavier than usual menstrual periods.  Have had a fall, accident, or any symptoms of bleeding or unusual bruising.  Develop an unusual fever. CALL 911 IN THE [place] OR GO TO THE EMERGENCY DEPARTMENT IF YOU:   Think you may be having an allergic reaction to warfarin. The signs of an allergic reaction could include itching, rash, hives, swelling, chest tightness, or trouble breathing.  See signs of blood in your urine. The signs could include reddish, pinkish, or tea-colored urine.  See signs of blood in your stools. The signs could include bright red or black stools.  Vomit or cough up blood. In these instances, the blood could have either a bright red or a "coffee-grounds" appearance.  Have bleeding that will not stop after applying pressure for 30 minutes such as cuts, nosebleeds, or other injuries.  Have severe pain in your joints or back.  Have a new and severe headache.  Have sudden weakness or numbness of your face, arm, or leg,  especially on one side of your body.  Have sudden confusion or trouble understanding.  Have sudden trouble seeing in one or both eyes.  Have sudden trouble walking, dizziness, loss of balance, or coordination.  Have trouble speaking or understanding (aphasia). Document Released: 12/21/2004 Document Revised: 05/07/2013 Document Reviewed: 06/16/2012 [facility] Patient Information 2015 [place], [place]. This information is not intended to replace advice given to you by your health care provider. Make sure you discuss any questions you have with your health care provider.

## 2013-12-12 ENCOUNTER — Telehealth: Payer: Self-pay | Admitting: Cardiovascular Disease

## 2013-12-12 NOTE — Telephone Encounter (Signed)
New Msg   Pt returning call please contact at [phone].

## 2013-12-12 NOTE — Telephone Encounter (Signed)
Pt is aware of appointment on 12/17/13 for an echo and coumadin clinic. Pt verbalized understanding.

## 2013-12-17 ENCOUNTER — Ambulatory Visit (INDEPENDENT_AMBULATORY_CARE_PROVIDER_SITE_OTHER): Payer: BC Managed Care – PPO

## 2013-12-17 ENCOUNTER — Other Ambulatory Visit: Payer: Self-pay

## 2013-12-17 ENCOUNTER — Ambulatory Visit (HOSPITAL_COMMUNITY): Payer: BC Managed Care – PPO | Attending: Cardiology | Admitting: Radiology

## 2013-12-17 DIAGNOSIS — R600 Localized edema: Secondary | ICD-10-CM | POA: Diagnosis not present

## 2013-12-17 DIAGNOSIS — F1010 Alcohol abuse, uncomplicated: Secondary | ICD-10-CM | POA: Insufficient documentation

## 2013-12-17 DIAGNOSIS — Z9981 Dependence on supplemental oxygen: Secondary | ICD-10-CM | POA: Insufficient documentation

## 2013-12-17 DIAGNOSIS — I4891 Unspecified atrial fibrillation: Secondary | ICD-10-CM

## 2013-12-17 DIAGNOSIS — I351 Nonrheumatic aortic (valve) insufficiency: Secondary | ICD-10-CM | POA: Insufficient documentation

## 2013-12-17 DIAGNOSIS — J449 Chronic obstructive pulmonary disease, unspecified: Secondary | ICD-10-CM | POA: Diagnosis not present

## 2013-12-17 DIAGNOSIS — Z5181 Encounter for therapeutic drug level monitoring: Secondary | ICD-10-CM | POA: Insufficient documentation

## 2013-12-17 DIAGNOSIS — I10 Essential (primary) hypertension: Secondary | ICD-10-CM | POA: Diagnosis not present

## 2013-12-17 DIAGNOSIS — F17210 Nicotine dependence, cigarettes, uncomplicated: Secondary | ICD-10-CM | POA: Diagnosis not present

## 2013-12-17 DIAGNOSIS — I340 Nonrheumatic mitral (valve) insufficiency: Secondary | ICD-10-CM | POA: Insufficient documentation

## 2013-12-17 DIAGNOSIS — R0600 Dyspnea, unspecified: Secondary | ICD-10-CM | POA: Diagnosis not present

## 2013-12-17 DIAGNOSIS — I509 Heart failure, unspecified: Secondary | ICD-10-CM | POA: Diagnosis not present

## 2013-12-17 DIAGNOSIS — E785 Hyperlipidemia, unspecified: Secondary | ICD-10-CM | POA: Diagnosis not present

## 2013-12-17 LAB — POCT INR: INR: 1.3

## 2013-12-17 NOTE — Progress Notes (Signed)
Echocardiogram performed.  

## 2013-12-17 NOTE — Patient Instructions (Signed)

## 2013-12-24 ENCOUNTER — Ambulatory Visit (INDEPENDENT_AMBULATORY_CARE_PROVIDER_SITE_OTHER): Payer: BC Managed Care – PPO | Admitting: Pharmacist

## 2013-12-24 DIAGNOSIS — Z5181 Encounter for therapeutic drug level monitoring: Secondary | ICD-10-CM

## 2013-12-24 DIAGNOSIS — I4891 Unspecified atrial fibrillation: Secondary | ICD-10-CM

## 2013-12-24 LAB — POCT INR: INR: 1.8

## 2014-01-01 ENCOUNTER — Ambulatory Visit (INDEPENDENT_AMBULATORY_CARE_PROVIDER_SITE_OTHER): Payer: BC Managed Care – PPO | Admitting: *Deleted

## 2014-01-01 DIAGNOSIS — Z5181 Encounter for therapeutic drug level monitoring: Secondary | ICD-10-CM

## 2014-01-01 DIAGNOSIS — I4891 Unspecified atrial fibrillation: Secondary | ICD-10-CM

## 2014-01-01 LAB — POCT INR: INR: 2.7

## 2014-01-09 ENCOUNTER — Ambulatory Visit (INDEPENDENT_AMBULATORY_CARE_PROVIDER_SITE_OTHER): Payer: BLUE CROSS/BLUE SHIELD | Admitting: *Deleted

## 2014-01-09 DIAGNOSIS — Z5181 Encounter for therapeutic drug level monitoring: Secondary | ICD-10-CM

## 2014-01-09 DIAGNOSIS — I4891 Unspecified atrial fibrillation: Secondary | ICD-10-CM

## 2014-01-09 LAB — POCT INR: INR: 2.6

## 2014-01-16 ENCOUNTER — Ambulatory Visit (INDEPENDENT_AMBULATORY_CARE_PROVIDER_SITE_OTHER): Payer: BLUE CROSS/BLUE SHIELD | Admitting: *Deleted

## 2014-01-16 DIAGNOSIS — I4891 Unspecified atrial fibrillation: Secondary | ICD-10-CM

## 2014-01-16 DIAGNOSIS — Z5181 Encounter for therapeutic drug level monitoring: Secondary | ICD-10-CM

## 2014-01-16 LAB — POCT INR: INR: 2.7

## 2014-01-21 ENCOUNTER — Telehealth: Payer: Self-pay | Admitting: Cardiovascular Disease

## 2014-01-21 NOTE — Telephone Encounter (Signed)
Called spoke with pt, advised pt he is pending DCCV needs weekly checks prior to procedure.  Pt has appt to see Dr Acie Fredrickson tomorrow as well.  Pt is concerned about his $42 copay, states he left a message for [facility], front office leader to call him back regarding this.  Advised pt I would make sure she got the message and call him back.  Spoke with [place] she states she did receive the message from the pt and she will call him back today to discuss.

## 2014-01-21 NOTE — Telephone Encounter (Signed)
New Msg       Pt would like to know if it is necessary to have coumadin checked tomorrow since he has had it checked mult. Weeks in a row.    Please call and advise.

## 2014-01-22 ENCOUNTER — Ambulatory Visit (INDEPENDENT_AMBULATORY_CARE_PROVIDER_SITE_OTHER): Payer: BLUE CROSS/BLUE SHIELD | Admitting: Cardiovascular Disease

## 2014-01-22 ENCOUNTER — Encounter: Payer: Self-pay | Admitting: Cardiovascular Disease

## 2014-01-22 ENCOUNTER — Ambulatory Visit (INDEPENDENT_AMBULATORY_CARE_PROVIDER_SITE_OTHER): Payer: BLUE CROSS/BLUE SHIELD | Admitting: *Deleted

## 2014-01-22 DIAGNOSIS — I4891 Unspecified atrial fibrillation: Secondary | ICD-10-CM

## 2014-01-22 DIAGNOSIS — Z5181 Encounter for therapeutic drug level monitoring: Secondary | ICD-10-CM

## 2014-01-22 DIAGNOSIS — I5022 Chronic systolic (congestive) heart failure: Secondary | ICD-10-CM | POA: Insufficient documentation

## 2014-01-22 LAB — POCT INR: INR: 2.7

## 2014-01-22 MED ORDER — CARVEDILOL 12.5 MG PO TABS: 12.5000 mg | ORAL_TABLET | Freq: Two times a day (BID) | ORAL | Status: DC

## 2014-01-22 NOTE — Progress Notes (Signed)
Cardiology Office Note   Date:  01/22/2014   ID:  Gerald Ho, DOB 12/22/1950, MRN [id number]  PCP:  Gerald Shivers, MD  Cardiologist:   Gerald Headings, MD   Chief Complaint  Patient presents with  . Follow-up    Afib, COPD      History of Present Illness: Gerald Ho is a 64 y.o. male who presents for follow up of his atrial fib. Hx of  COPD  He was seen in the office a month ago and was found to have have A-fib. He was started on Eliquis but changed to coumadin because of cost.  He may want to change back because of the cost of the INR checks.   He seems to be doing better.    He had an echo that showed right and left ventricular failure. Left ventricle: The cavity size was normal. Wall thickness was increased in a pattern of mild LVH. Indeterminant diastolic function (atrial fibrillation). The estimated ejection fraction was 35%. Diffuse hypokinesis. - Aortic valve: There was no stenosis. There was trivial regurgitation. - Aorta: Mildly dilated aortic root. Aortic root dimension: 39 mm (ED). - Mitral valve: There was trivial regurgitation. - Left atrium: The atrium was moderately to severely dilated. - Right ventricle: The cavity size was moderately dilated. Systolic function was moderately reduced. - Right atrium: The atrium was moderately dilated. - Tricuspid valve: Peak RV-RA gradient (S): 37 mm Hg. - Pulmonary arteries: PA peak pressure: 52 mm Hg (S). - Systemic veins: IVC measured 2.8 cm with < 50% respirophasic variation, suggesting RA pressure 15 mmHg.       Past Medical History  Diagnosis Date  . Current smoker     2 packs per day  . High blood pressure   . Emphysema     Past Surgical History  Procedure Laterality Date  . Lumbar disc surgery       Current Outpatient Prescriptions  Medication Sig Dispense Refill  . albuterol (PROVENTIL HFA;VENTOLIN HFA) 108 (90 BASE) MCG/ACT inhaler Inhale 2 puffs into the lungs every  6 (six) hours as needed for wheezing or shortness of breath. 1 Inhaler 6  . diltiazem (CARDIZEM CD) 120 MG 24 hr capsule Take 1 capsule (120 mg total) by mouth daily. 30 capsule 3  . lamoTRIgine (LAMICTAL) 200 MG tablet Take 200 mg by mouth daily.    Marland Kitchen losartan (COZAAR) 50 MG tablet Take 50 mg by mouth daily.    . pravastatin (PRAVACHOL) 20 MG tablet Take 1 tablet by mouth daily.    . QUEtiapine (SEROQUEL) 100 MG tablet Take 100 mg by mouth at bedtime as needed.    . RABEprazole (ACIPHEX) 20 MG tablet Take 20 mg by mouth daily.     . traZODone (DESYREL) 100 MG tablet Take 100 mg by mouth at bedtime.    Marland Kitchen Umeclidinium-Vilanterol (ANORO ELLIPTA) 62.5-25 MCG/INH AEPB Inhale 1 puff into the lungs daily. 60 each 6  . warfarin (COUMADIN) 5 MG tablet Take 1 tablet (5 mg total) by mouth daily. TAKE ONE TABLET EVERYDAY EXCEPT Monday  And Thursday  TAKE A HALF OF A TABLET 30 tablet 4   No current facility-administered medications for this visit.    Allergies:   Review of patient's allergies indicates no known allergies.    Social History:  The patient  reports that he has been smoking Cigarettes.  He has a 80 pack-year smoking history. He does not have any smokeless tobacco history on file. He reports that  he drinks alcohol.   Family History:  The patient's family history includes Emphysema in his father.    ROS:  Please see the history of present illness.   Otherwise, review of systems are positive for none.   All other systems are reviewed and negative.    PHYSICAL EXAM: VS:  BP 146/70 mmHg  Pulse 80  Ht 6' (1.829 m)  Wt 213 lb 6.4 oz (96.798 kg)  BMI 28.94 kg/m2  SpO2 92% , BMI Body mass index is 28.94 kg/(m^2). GEN: Well nourished, well developed, in no acute distress HEENT: normal Neck: no JVD, carotid bruits, or masses Cardiac: Irreg. Irreg.  ; soft systolic murmur.  Trace leg edema  Respiratory:  clear to auscultation bilaterally, normal work of breathing GI: soft, nontender,  nondistended, + BS MS: no deformity or atrophy Skin: warm and dry, no rash Neuro:  Strength and sensation are intact Psych: euthymic mood, full affect   EKG:  EKG is not ordered today.    Recent Labs: No results found for requested labs within last 365 days.    Lipid Panel No results found for: CHOL, TRIG, HDL, CHOLHDL, VLDL, LDLCALC, LDLDIRECT    Wt Readings from Last 3 Encounters:  01/22/14 213 lb 6.4 oz (96.798 kg)  12/11/13 209 lb 1.9 oz (94.856 kg)  12/05/13 217 lb 3.2 oz (98.521 kg)     Other studies Reviewed:   ASSESSMENT AND PLAN:  1.  Atrial fibrillation with rapid ventricular response: The patient presents with persistent atrial fibrillation. He's been on Coumadin. His INR levels have been therapeutic. His rate is better although still a little fast.  The echocardiogram revealed moderate to severe left ventricular systolic dysfunction with an ejection fraction of 35%.  He was initially started on Cardizem. We will discontinue the Cardizem and we'll start him on carvedilol 12.5 g twice a day. We will actually start him on 6.25 twice a day for the first week.  I anticipate doing a cardioversion in the next month or so.  We discussed the cost of checking his INR levels. He may want to switch to a NOAC.   2. Chronic systolic congestive heart failure: The patient was found to have moderate to severe LV dysfunction. This may be due to his former alcohol intake. He now drinks 2 or perhaps 3 beers a day. It's also quite likely that the atrial fibrillation with rapid ventricular response is the cause of his CHF.  He's completely asymptomatic. We will at some point order  a stress Myoview study.  He does not have any episodes of angina.  We could also consider a right left heart catheterization.  We'll wait until after he's cardioverted so that he does not have to interrupt his any coagulation.  2. COPD: He has fairly severe COPD. This is managed by his primary medical  doctor.  3. Hypertension:  Continue losartan. We'll be starting carvedilol.    Current medicines are reviewed at length with the patient today.  The patient does not have concerns regarding medicines.  The following changes have been made:  Will DC Cardizem  .  starte Coreg 12. 5 BID   Labs/ tests ordered today include:  INR  No orders of the defined types were placed in this encounter.     Disposition:   FU with me in 1 month   Signed, Gerald Ho, Wonda Cheng, MD  01/22/2014 8:12 AM    [place], [place]  [address] Phone: [phone]; Fax: [phone]

## 2014-01-22 NOTE — Patient Instructions (Addendum)
Your physician has recommended you make the following change in your medication:  STOP Cardizem (Diltiazem) START Carvedilol (Coreg) 12.5 mg twice daily - start with 1/2 tablet (6.25 mg) for 1 week then increase to whole tablet  Your physician recommends that you schedule a follow-up appointment in: 1 month with Dr. Acie Fredrickson

## 2014-01-31 ENCOUNTER — Ambulatory Visit (INDEPENDENT_AMBULATORY_CARE_PROVIDER_SITE_OTHER): Payer: BLUE CROSS/BLUE SHIELD | Admitting: Pharmacist Clinician (PhC)/ Clinical Pharmacy Specialist

## 2014-01-31 DIAGNOSIS — I4891 Unspecified atrial fibrillation: Secondary | ICD-10-CM

## 2014-01-31 DIAGNOSIS — Z5181 Encounter for therapeutic drug level monitoring: Secondary | ICD-10-CM

## 2014-01-31 LAB — POCT INR: INR: 3.7

## 2014-02-08 ENCOUNTER — Ambulatory Visit (INDEPENDENT_AMBULATORY_CARE_PROVIDER_SITE_OTHER): Payer: BLUE CROSS/BLUE SHIELD | Admitting: *Deleted

## 2014-02-08 ENCOUNTER — Other Ambulatory Visit: Payer: Self-pay | Admitting: *Deleted

## 2014-02-08 ENCOUNTER — Telehealth: Payer: Self-pay

## 2014-02-08 DIAGNOSIS — Z5181 Encounter for therapeutic drug level monitoring: Secondary | ICD-10-CM

## 2014-02-08 DIAGNOSIS — I4891 Unspecified atrial fibrillation: Secondary | ICD-10-CM

## 2014-02-08 LAB — POCT INR: INR: 3.1

## 2014-02-08 MED ORDER — WARFARIN SODIUM 5 MG PO TABS: 5.0000 mg | ORAL_TABLET | Freq: Every day | ORAL | Status: DC

## 2014-02-08 NOTE — Telephone Encounter (Signed)
Spoke with [facility] and sent a new prescription to them with detail Wafarin dosage per them for insurance purposes.  Instructed them that the dose may change from weekly,therefore included in the prescription "or take as directed by Coumadin Clinic". They verbalixed understanding and order sent electronically.

## 2014-02-11 ENCOUNTER — Other Ambulatory Visit: Payer: Self-pay | Admitting: Cardiovascular Disease

## 2014-02-15 ENCOUNTER — Ambulatory Visit (INDEPENDENT_AMBULATORY_CARE_PROVIDER_SITE_OTHER): Payer: BLUE CROSS/BLUE SHIELD | Admitting: *Deleted

## 2014-02-15 DIAGNOSIS — Z5181 Encounter for therapeutic drug level monitoring: Secondary | ICD-10-CM

## 2014-02-15 DIAGNOSIS — I4891 Unspecified atrial fibrillation: Secondary | ICD-10-CM

## 2014-02-15 LAB — POCT INR: INR: 3.8

## 2014-02-22 ENCOUNTER — Encounter: Payer: Self-pay | Admitting: Cardiovascular Disease

## 2014-02-22 ENCOUNTER — Encounter (INDEPENDENT_AMBULATORY_CARE_PROVIDER_SITE_OTHER): Payer: BLUE CROSS/BLUE SHIELD

## 2014-02-22 ENCOUNTER — Ambulatory Visit (INDEPENDENT_AMBULATORY_CARE_PROVIDER_SITE_OTHER): Payer: BLUE CROSS/BLUE SHIELD | Admitting: Cardiology

## 2014-02-22 ENCOUNTER — Ambulatory Visit (INDEPENDENT_AMBULATORY_CARE_PROVIDER_SITE_OTHER): Payer: BLUE CROSS/BLUE SHIELD | Admitting: Cardiovascular Disease

## 2014-02-22 ENCOUNTER — Encounter: Payer: Self-pay | Admitting: Nurse Practitioner

## 2014-02-22 VITALS — BP 106/78 | HR 66 | Ht 72.0 in | Wt 213.1 lb

## 2014-02-22 DIAGNOSIS — Z5181 Encounter for therapeutic drug level monitoring: Secondary | ICD-10-CM

## 2014-02-22 DIAGNOSIS — I4891 Unspecified atrial fibrillation: Secondary | ICD-10-CM

## 2014-02-22 DIAGNOSIS — I481 Persistent atrial fibrillation: Secondary | ICD-10-CM

## 2014-02-22 DIAGNOSIS — J449 Chronic obstructive pulmonary disease, unspecified: Secondary | ICD-10-CM

## 2014-02-22 DIAGNOSIS — I5022 Chronic systolic (congestive) heart failure: Secondary | ICD-10-CM

## 2014-02-22 DIAGNOSIS — I4819 Other persistent atrial fibrillation: Secondary | ICD-10-CM

## 2014-02-22 LAB — CBC WITH DIFFERENTIAL/PLATELET
BASOS PCT: 0.3 % (ref 0.0–3.0)
Basophils Absolute: 0 10*3/uL (ref 0.0–0.1)
EOS ABS: 0.2 10*3/uL (ref 0.0–0.7)
EOS PCT: 2.3 % (ref 0.0–5.0)
HEMATOCRIT: 43.5 % (ref 39.0–52.0)
HEMOGLOBIN: 15 g/dL (ref 13.0–17.0)
Lymphocytes Relative: 32.2 % (ref 12.0–46.0)
Lymphs Abs: 2.5 10*3/uL (ref 0.7–4.0)
MCHC: 34.5 g/dL (ref 30.0–36.0)
MCV: 95.4 fl (ref 78.0–100.0)
MONO ABS: 0.7 10*3/uL (ref 0.1–1.0)
MONOS PCT: 8.9 % (ref 3.0–12.0)
NEUTROS ABS: 4.4 10*3/uL (ref 1.4–7.7)
Neutrophils Relative %: 56.3 % (ref 43.0–77.0)
PLATELETS: 277 10*3/uL (ref 150.0–400.0)
RBC: 4.56 Mil/uL (ref 4.22–5.81)
RDW: 15.1 % (ref 11.5–15.5)
WBC: 7.8 10*3/uL (ref 4.0–10.5)

## 2014-02-22 LAB — BASIC METABOLIC PANEL
BUN: 22 mg/dL (ref 6–23)
CALCIUM: 9.8 mg/dL (ref 8.4–10.5)
CO2: 29 mEq/L (ref 19–32)
Chloride: 96 mEq/L (ref 96–112)
Creatinine, Ser: 1.75 mg/dL — ABNORMAL HIGH (ref 0.40–1.50)
GFR: 42.01 mL/min — ABNORMAL LOW (ref >60.00)
GLUCOSE: 96 mg/dL (ref 70–99)
POTASSIUM: 4.7 meq/L (ref 3.5–5.1)
Sodium: 131 mEq/L — ABNORMAL LOW (ref 135–145)

## 2014-02-22 LAB — PROTIME-INR
INR: 3.9 ratio — AB (ref 0.8–1.0)
Prothrombin Time: 41.6 s — ABNORMAL HIGH (ref 9.6–13.1)

## 2014-02-22 NOTE — Patient Instructions (Addendum)
Your physician has recommended that you have a Cardioversion (DCCV). Electrical Cardioversion uses a jolt of electricity to your heart either through paddles or wired patches attached to your chest. This is a controlled, usually prescheduled, procedure. Defibrillation is done under light anesthesia in the hospital, and you usually go home the day of the procedure. This is done to get your heart back into a normal rhythm. You are not awake for the procedure. Please see the instruction sheet given to you today.  Your physician recommends that you have lab work:  TODAY - CBC, Pt/INR, BMET  Your physician recommends that you continue on your current medications as directed. Please refer to the Current Medication list given to you today.  Your physician recommends that you schedule a follow-up appointment in: 2 months with Dr. Acie Fredrickson.

## 2014-02-22 NOTE — Progress Notes (Signed)
Cardiology Office Note   Date:  02/22/2014   ID:  Gerald Ho, DOB Jun 19, 1950, MRN [id number]  PCP:  Maryella Shivers, MD  Cardiologist:   Thayer Headings, MD   Chief Complaint  Patient presents with  . Atrial Fibrillation   Gerald Ho is a 64 y.o. male who presents for follow up of his atrial fib. Hx of  COPD  He was seen in the office a month ago and was found to have have A-fib. He was started on Eliquis but changed to coumadin because of cost. He may want to change back because of the cost of the INR checks.   He seems to be doing better.   He had an echo that showed right and left ventricular failure. Left ventricle: The cavity size was normal. Wall thickness was increased in a pattern of mild LVH. Indeterminant diastolic function (atrial fibrillation). The estimated ejection fraction was 35%. Diffuse hypokinesis. - Aortic valve: There was no stenosis. There was trivial regurgitation. - Aorta: Mildly dilated aortic root. Aortic root dimension: 39 mm (ED). - Mitral valve: There was trivial regurgitation. - Left atrium: The atrium was moderately to severely dilated. - Right ventricle: The cavity size was moderately dilated. Systolic function was moderately reduced. - Right atrium: The atrium was moderately dilated. - Tricuspid valve: Peak RV-RA gradient (S): 37 mm Hg. - Pulmonary arteries: PA peak pressure: 52 mm Hg (S). - Systemic veins: IVC measured 2.8 cm with < 50% respirophasic variation, suggesting RA pressure 15 mmHg.   Feb. 19, 2016:  Gerald Ho is a 64 y.o. male who presents for follow up of his atrial fib.  He has not had any problems.   He is been therapeutic with his Coumadin. We will schedule a cardioversion.   Past Medical History  Diagnosis Date  . Current smoker     2 packs per day  . High blood pressure   . Emphysema     Past Surgical History  Procedure Laterality Date  . Lumbar disc surgery       Current  Outpatient Prescriptions  Medication Sig Dispense Refill  . albuterol (PROVENTIL HFA;VENTOLIN HFA) 108 (90 BASE) MCG/ACT inhaler Inhale 2 puffs into the lungs every 6 (six) hours as needed for wheezing or shortness of breath. 1 Inhaler 6  . carvedilol (COREG) 12.5 MG tablet Take 1 tablet (12.5 mg total) by mouth 2 (two) times daily. 62 tablet 11  . lamoTRIgine (LAMICTAL) 200 MG tablet Take 200 mg by mouth daily.    Marland Kitchen losartan (COZAAR) 50 MG tablet Take 50 mg by mouth daily.    . pravastatin (PRAVACHOL) 20 MG tablet Take 1 tablet by mouth daily.    . QUEtiapine (SEROQUEL) 100 MG tablet Take 100 mg by mouth at bedtime as needed.    . RABEprazole (ACIPHEX) 20 MG tablet Take 20 mg by mouth daily.     . traZODone (DESYREL) 100 MG tablet Take 100 mg by mouth at bedtime.    Marland Kitchen Umeclidinium-Vilanterol (ANORO ELLIPTA) 62.5-25 MCG/INH AEPB Inhale 1 puff into the lungs daily. 60 each 6  . warfarin (COUMADIN) 5 MG tablet Take as directed by Coumadin clinic 120 tablet 1   No current facility-administered medications for this visit.    Allergies:   Review of patient's allergies indicates no known allergies.    Social History:  The patient  reports that he has been smoking Cigarettes.  He has a 80 pack-year smoking history. He does not have any  smokeless tobacco history on file. He reports that he drinks alcohol.   Family History:  The patient's family history includes Emphysema in his father.    ROS:  Please see the history of present illness.    Review of Systems: Constitutional:  denies fever, chills, diaphoresis, appetite change and fatigue.  HEENT: denies photophobia, eye pain, redness, hearing loss, ear pain, congestion, sore throat, rhinorrhea, sneezing, neck pain, neck stiffness and tinnitus.  Respiratory: denies SOB, DOE, cough, chest tightness, and wheezing.  Cardiovascular: denies chest pain, palpitations and leg swelling.  Gastrointestinal: denies nausea, vomiting, abdominal pain,  diarrhea, constipation, blood in stool.  Genitourinary: denies dysuria, urgency, frequency, hematuria, flank pain and difficulty urinating.  Musculoskeletal: denies  myalgias, back pain, joint swelling, arthralgias and gait problem.   Skin: denies pallor, rash and wound.  Neurological: denies dizziness, seizures, syncope, weakness, light-headedness, numbness and headaches.   Hematological: denies adenopathy, easy bruising, personal or family bleeding history.  Psychiatric/ Behavioral: denies suicidal ideation, mood changes, confusion, nervousness, sleep disturbance and agitation.       All other systems are reviewed and negative.    PHYSICAL EXAM: VS:  There were no vitals taken for this visit. , BMI There is no weight on file to calculate BMI. GEN: Well nourished, well developed, in no acute distress HEENT: normal Neck: no JVD, carotid bruits, or masses Cardiac: RRR; no murmurs, rubs, or gallops,no edema  Respiratory:  clear to auscultation bilaterally, normal work of breathing GI: soft, nontender, nondistended, + BS MS: no deformity or atrophy Skin: warm and dry, no rash Neuro:  Strength and sensation are intact Psych: normal   EKG:  EKG is not ordered today.    Recent Labs: No results found for requested labs within last 365 days.    Lipid Panel No results found for: CHOL, TRIG, HDL, CHOLHDL, VLDL, LDLCALC, LDLDIRECT    Wt Readings from Last 3 Encounters:  01/22/14 213 lb 6.4 oz (96.798 kg)  12/11/13 209 lb 1.9 oz (94.856 kg)  12/05/13 217 lb 3.2 oz (98.521 kg)      Other studies Reviewed: Additional studies/ records that were reviewed today include: . Review of the above records demonstrates:    ASSESSMENT AND PLAN:  1.  Atrial fibrillation: The patient has been therapeutic on his Coumadin. I think is reasonable to attempt card eversion. We discussed the procedure. He has a history of  COPD and is possible that he does not maintain sinus rhythm. He seems to  be fairly asymptomatic and we may end up treating him with rate control and anticoagulation rather than an aggressive rhythm control strategy. I'll see him again in several months.   Current medicines are reviewed at length with the patient today.  The patient does not have concerns regarding medicines.  The following changes have been made:  no change   Disposition:   FU with me in several months.     Signed, Sevon Rotert, Wonda Cheng, MD  02/22/2014 8:59 AM    [facility] [place], [place], [place]  [address] Phone: [phone]; Fax: [phone]

## 2014-02-25 ENCOUNTER — Telehealth: Payer: Self-pay | Admitting: Cardiovascular Disease

## 2014-02-25 NOTE — Telephone Encounter (Signed)
New message      Pt is having a procedure at the hosp tomorrow.  He want to know how long it will take

## 2014-02-25 NOTE — Telephone Encounter (Signed)
Spoke with patient who is scheduled for cardioversion tomorrow with Dr. Acie Fredrickson.  Patient called to ask how long procedure will take; all questions answered to patient's satisfaction and patient thanked me for the call.

## 2014-02-26 ENCOUNTER — Ambulatory Visit (HOSPITAL_COMMUNITY)
Admission: RE | Admit: 2014-02-26 | Discharge: 2014-02-26 | Disposition: A | Discharge status: Home / Self Care | Payer: BLUE CROSS/BLUE SHIELD | Source: Ambulatory Visit | Attending: Cardiovascular Disease | Admitting: Cardiovascular Disease

## 2014-02-26 ENCOUNTER — Encounter (HOSPITAL_COMMUNITY): Payer: Self-pay | Admitting: Certified Registered Nurse Anesthetist

## 2014-02-26 ENCOUNTER — Encounter (HOSPITAL_COMMUNITY)
Admission: RE | Disposition: A | Discharge status: Home / Self Care | Payer: Self-pay | Source: Ambulatory Visit | Attending: Cardiovascular Disease

## 2014-02-26 ENCOUNTER — Ambulatory Visit (HOSPITAL_COMMUNITY): Payer: BLUE CROSS/BLUE SHIELD | Admitting: Certified Registered Nurse Anesthetist

## 2014-02-26 DIAGNOSIS — I4891 Unspecified atrial fibrillation: Secondary | ICD-10-CM | POA: Insufficient documentation

## 2014-02-26 DIAGNOSIS — I509 Heart failure, unspecified: Secondary | ICD-10-CM | POA: Insufficient documentation

## 2014-02-26 DIAGNOSIS — F17210 Nicotine dependence, cigarettes, uncomplicated: Secondary | ICD-10-CM | POA: Insufficient documentation

## 2014-02-26 DIAGNOSIS — F1010 Alcohol abuse, uncomplicated: Secondary | ICD-10-CM | POA: Insufficient documentation

## 2014-02-26 DIAGNOSIS — J439 Emphysema, unspecified: Secondary | ICD-10-CM | POA: Diagnosis not present

## 2014-02-26 DIAGNOSIS — Z9981 Dependence on supplemental oxygen: Secondary | ICD-10-CM | POA: Diagnosis not present

## 2014-02-26 DIAGNOSIS — Z7901 Long term (current) use of anticoagulants: Secondary | ICD-10-CM | POA: Insufficient documentation

## 2014-02-26 DIAGNOSIS — F1910 Other psychoactive substance abuse, uncomplicated: Secondary | ICD-10-CM | POA: Insufficient documentation

## 2014-02-26 DIAGNOSIS — I10 Essential (primary) hypertension: Secondary | ICD-10-CM | POA: Insufficient documentation

## 2014-02-26 HISTORY — PX: CARDIOVERSION: SHX1299

## 2014-02-26 SURGERY — CARDIOVERSION
Anesthesia: General

## 2014-02-26 MED ORDER — PROPOFOL 10 MG/ML IV BOLUS
INTRAVENOUS | Status: DC | PRN
  Administered 2014-02-26: 60 mg via INTRAVENOUS

## 2014-02-26 MED ORDER — LIDOCAINE HCL (CARDIAC) 20 MG/ML IV SOLN
INTRAVENOUS | Status: DC | PRN
  Administered 2014-02-26: 100 mg via INTRAVENOUS

## 2014-02-26 MED ORDER — SODIUM CHLORIDE 0.9 % IV SOLN
INTRAVENOUS | Status: DC | PRN
  Administered 2014-02-26: 08:00:00 via INTRAVENOUS

## 2014-02-26 NOTE — Discharge Instructions (Signed)

## 2014-02-26 NOTE — Transfer of Care (Signed)
Immediate Anesthesia Transfer of Care Note  Patient: Gerald Ho  Procedure(s) Performed: Procedure(s): CARDIOVERSION (N/A)  Patient Location: Endoscopy Unit  Anesthesia Type:General  Level of Consciousness: awake, alert  and oriented  Airway & Oxygen Therapy: Patient Spontanous Breathing and Patient connected to nasal cannula oxygen  Post-op Assessment: Report given to RN, Post -op Vital signs reviewed and stable and Patient moving all extremities X 4  Post vital signs: Reviewed and stable  Last Vitals:  Filed Vitals:   02/26/14 0753  BP: 132/85  Pulse: 86  Temp:   Resp: 15    Complications: No apparent anesthesia complications

## 2014-02-26 NOTE — CV Procedure (Signed)
    Cardioversion Note  SHAKEEM STERN [id number] November 25, 1950  Procedure: DC Cardioversion Indications: atrial fib   Procedure Details Consent: Obtained Time Out: Verified patient identification, verified procedure, site/side was marked, verified correct patient position, special equipment/implants available, Radiology Safety Procedures followed,  medications/allergies/relevent history reviewed, required imaging and test results available.  Performed  The patient has been on adequate anticoagulation.  The patient received IV Lidocaine 100 mg followed by Propofol 60 mg iv  for sedation.  Synchronous cardioversion was performed at 120 joules.  The cardioversion was successful     Complications: No apparent complications Patient did tolerate procedure well.   Thayer Headings, Brooke Bonito., MD, [place] 02/26/2014, 8:15 AM

## 2014-02-26 NOTE — Anesthesia Preprocedure Evaluation (Addendum)
Anesthesia Evaluation  Patient identified by MRN, date of birth, ID band Patient awake    Reviewed: Allergy & Precautions, NPO status , Patient's Chart, lab work & pertinent test results  Airway Mallampati: II  TM Distance: >3 FB Neck ROM: Full    Dental no notable dental hx. (+) Dental Advisory Given   Pulmonary shortness of breath and with exertion, COPD COPD inhaler and oxygen dependent, Current Smoker,  breath sounds clear to auscultation  Pulmonary exam normal       Cardiovascular hypertension, Pt. on medications and Pt. on home beta blockers +CHF Rhythm:Irregular Rate:Normal     Neuro/Psych negative neurological ROS  negative psych ROS   GI/Hepatic (+)     substance abuse  alcohol use,   Endo/Other  negative endocrine ROS  Renal/GU negative Renal ROS  negative genitourinary   Musculoskeletal negative musculoskeletal ROS (+)   Abdominal   Peds negative pediatric ROS (+)  Hematology negative hematology ROS (+)   Anesthesia Other Findings   Reproductive/Obstetrics negative OB ROS                           Anesthesia Physical Anesthesia Plan  ASA: IV  Anesthesia Plan: General   Post-op Pain Management:    Induction: Intravenous  Airway Management Planned: Natural Airway  Additional Equipment:   Intra-op Plan:   Post-operative Plan: Extubation in OR  Informed Consent: I have reviewed the patients History and Physical, chart, labs and discussed the procedure including the risks, benefits and alternatives for the proposed anesthesia with the patient or authorized representative who has indicated his/her understanding and acceptance.   Dental advisory given  Plan Discussed with: CRNA, Anesthesiologist and Surgeon  Anesthesia Plan Comments:        Anesthesia Quick Evaluation

## 2014-02-26 NOTE — Anesthesia Postprocedure Evaluation (Signed)
  Anesthesia Post-op Note  Patient: Gerald Ho  Procedure(s) Performed: Procedure(s): CARDIOVERSION (N/A)  Patient Location: Endoscopy Unit  Anesthesia Type:General  Level of Consciousness: awake, alert  and oriented  Airway and Oxygen Therapy: Patient Spontanous Breathing and Patient connected to nasal cannula oxygen  Post-op Pain: none  Post-op Assessment: Post-op Vital signs reviewed, Patient's Cardiovascular Status Stable, Respiratory Function Stable, Patent Airway and No signs of Nausea or vomiting  Post-op Vital Signs: Reviewed and stable  Last Vitals:  Filed Vitals:   02/26/14 0823  BP: 137/74  Pulse: 65  Temp:   Resp: 16    Complications: No apparent anesthesia complications

## 2014-02-26 NOTE — Anesthesia Procedure Notes (Signed)
Procedure Name: MAC Date/Time: 02/26/2014 8:08 AM Performed by: Garrison Columbus T Pre-anesthesia Checklist: Patient identified, Emergency Drugs available, Suction available and Patient being monitored Patient Re-evaluated:Patient Re-evaluated prior to inductionOxygen Delivery Method: Ambu bag Preoxygenation: Pre-oxygenation with 100% oxygen Intubation Type: IV induction Ventilation: Mask ventilation without difficulty and Oral airway inserted - appropriate to patient size Placement Confirmation: positive ETCO2 and breath sounds checked- equal and bilateral Dental Injury: Teeth and Oropharynx as per pre-operative assessment

## 2014-02-26 NOTE — Interval H&P Note (Signed)
History and Physical Interval Note:  02/26/2014 8:05 AM  Gerald Ho  has presented today for surgery, with the diagnosis of A FIB  The various methods of treatment have been discussed with the patient and family. After consideration of risks, benefits and other options for treatment, the patient has consented to  Procedure(s): CARDIOVERSION (N/A) as a surgical intervention .  The patient's history has been reviewed, patient examined, no change in status, stable for surgery.  I have reviewed the patient's chart and labs.  Questions were answered to the patient's satisfaction.     Nahser, Wonda Cheng

## 2014-02-26 NOTE — H&P (View-Only) (Signed)
Cardiology Office Note   Date:  02/22/2014   ID:  Gerald Ho, DOB 04/13/1950, MRN [id number]  PCP:  Maryella Shivers, MD  Cardiologist:   Thayer Headings, MD   Chief Complaint  Patient presents with  . Atrial Fibrillation   Gerald Ho is a 64 y.o. male who presents for follow up of his atrial fib. Hx of  COPD  He was seen in the office a month ago and was found to have have A-fib. He was started on Eliquis but changed to coumadin because of cost. He may want to change back because of the cost of the INR checks.   He seems to be doing better.   He had an echo that showed right and left ventricular failure. Left ventricle: The cavity size was normal. Wall thickness was increased in a pattern of mild LVH. Indeterminant diastolic function (atrial fibrillation). The estimated ejection fraction was 35%. Diffuse hypokinesis. - Aortic valve: There was no stenosis. There was trivial regurgitation. - Aorta: Mildly dilated aortic root. Aortic root dimension: 39 mm (ED). - Mitral valve: There was trivial regurgitation. - Left atrium: The atrium was moderately to severely dilated. - Right ventricle: The cavity size was moderately dilated. Systolic function was moderately reduced. - Right atrium: The atrium was moderately dilated. - Tricuspid valve: Peak RV-RA gradient (S): 37 mm Hg. - Pulmonary arteries: PA peak pressure: 52 mm Hg (S). - Systemic veins: IVC measured 2.8 cm with < 50% respirophasic variation, suggesting RA pressure 15 mmHg.   Feb. 19, 2016:  Gerald Ho is a 64 y.o. male who presents for follow up of his atrial fib.  He has not had any problems.   He is been therapeutic with his Coumadin. We will schedule a cardioversion.   Past Medical History  Diagnosis Date  . Current smoker     2 packs per day  . High blood pressure   . Emphysema     Past Surgical History  Procedure Laterality Date  . Lumbar disc surgery       Current  Outpatient Prescriptions  Medication Sig Dispense Refill  . albuterol (PROVENTIL HFA;VENTOLIN HFA) 108 (90 BASE) MCG/ACT inhaler Inhale 2 puffs into the lungs every 6 (six) hours as needed for wheezing or shortness of breath. 1 Inhaler 6  . carvedilol (COREG) 12.5 MG tablet Take 1 tablet (12.5 mg total) by mouth 2 (two) times daily. 62 tablet 11  . lamoTRIgine (LAMICTAL) 200 MG tablet Take 200 mg by mouth daily.    Marland Kitchen losartan (COZAAR) 50 MG tablet Take 50 mg by mouth daily.    . pravastatin (PRAVACHOL) 20 MG tablet Take 1 tablet by mouth daily.    . QUEtiapine (SEROQUEL) 100 MG tablet Take 100 mg by mouth at bedtime as needed.    . RABEprazole (ACIPHEX) 20 MG tablet Take 20 mg by mouth daily.     . traZODone (DESYREL) 100 MG tablet Take 100 mg by mouth at bedtime.    Marland Kitchen Umeclidinium-Vilanterol (ANORO ELLIPTA) 62.5-25 MCG/INH AEPB Inhale 1 puff into the lungs daily. 60 each 6  . warfarin (COUMADIN) 5 MG tablet Take as directed by Coumadin clinic 120 tablet 1   No current facility-administered medications for this visit.    Allergies:   Review of patient's allergies indicates no known allergies.    Social History:  The patient  reports that he has been smoking Cigarettes.  He has a 80 pack-year smoking history. He does not have any  smokeless tobacco history on file. He reports that he drinks alcohol.   Family History:  The patient's family history includes Emphysema in his father.    ROS:  Please see the history of present illness.    Review of Systems: Constitutional:  denies fever, chills, diaphoresis, appetite change and fatigue.  HEENT: denies photophobia, eye pain, redness, hearing loss, ear pain, congestion, sore throat, rhinorrhea, sneezing, neck pain, neck stiffness and tinnitus.  Respiratory: denies SOB, DOE, cough, chest tightness, and wheezing.  Cardiovascular: denies chest pain, palpitations and leg swelling.  Gastrointestinal: denies nausea, vomiting, abdominal pain,  diarrhea, constipation, blood in stool.  Genitourinary: denies dysuria, urgency, frequency, hematuria, flank pain and difficulty urinating.  Musculoskeletal: denies  myalgias, back pain, joint swelling, arthralgias and gait problem.   Skin: denies pallor, rash and wound.  Neurological: denies dizziness, seizures, syncope, weakness, light-headedness, numbness and headaches.   Hematological: denies adenopathy, easy bruising, personal or family bleeding history.  Psychiatric/ Behavioral: denies suicidal ideation, mood changes, confusion, nervousness, sleep disturbance and agitation.       All other systems are reviewed and negative.    PHYSICAL EXAM: VS:  There were no vitals taken for this visit. , BMI There is no weight on file to calculate BMI. GEN: Well nourished, well developed, in no acute distress HEENT: normal Neck: no JVD, carotid bruits, or masses Cardiac: RRR; no murmurs, rubs, or gallops,no edema  Respiratory:  clear to auscultation bilaterally, normal work of breathing GI: soft, nontender, nondistended, + BS MS: no deformity or atrophy Skin: warm and dry, no rash Neuro:  Strength and sensation are intact Psych: normal   EKG:  EKG is not ordered today.    Recent Labs: No results found for requested labs within last 365 days.    Lipid Panel No results found for: CHOL, TRIG, HDL, CHOLHDL, VLDL, LDLCALC, LDLDIRECT    Wt Readings from Last 3 Encounters:  01/22/14 213 lb 6.4 oz (96.798 kg)  12/11/13 209 lb 1.9 oz (94.856 kg)  12/05/13 217 lb 3.2 oz (98.521 kg)      Other studies Reviewed: Additional studies/ records that were reviewed today include: . Review of the above records demonstrates:    ASSESSMENT AND PLAN:  1.  Atrial fibrillation: The patient has been therapeutic on his Coumadin. I think is reasonable to attempt card eversion. We discussed the procedure. He has a history of  COPD and is possible that he does not maintain sinus rhythm. He seems to  be fairly asymptomatic and we may end up treating him with rate control and anticoagulation rather than an aggressive rhythm control strategy. I'll see him again in several months.   Current medicines are reviewed at length with the patient today.  The patient does not have concerns regarding medicines.  The following changes have been made:  no change   Disposition:   FU with me in several months.     Signed, Emmet Messer, Wonda Cheng, MD  02/22/2014 8:59 AM    [facility] [address], [place], [place]  [address] Phone: [phone]; Fax: [phone]

## 2014-02-27 ENCOUNTER — Encounter (HOSPITAL_COMMUNITY): Payer: Self-pay | Admitting: Cardiovascular Disease

## 2014-03-05 ENCOUNTER — Ambulatory Visit (INDEPENDENT_AMBULATORY_CARE_PROVIDER_SITE_OTHER): Payer: BLUE CROSS/BLUE SHIELD | Admitting: *Deleted

## 2014-03-05 ENCOUNTER — Encounter (HOSPITAL_COMMUNITY): Payer: Self-pay | Admitting: Cardiovascular Disease

## 2014-03-05 DIAGNOSIS — Z5181 Encounter for therapeutic drug level monitoring: Secondary | ICD-10-CM

## 2014-03-05 DIAGNOSIS — I4891 Unspecified atrial fibrillation: Secondary | ICD-10-CM

## 2014-03-05 LAB — POCT INR: INR: 2.3

## 2014-03-22 ENCOUNTER — Ambulatory Visit (INDEPENDENT_AMBULATORY_CARE_PROVIDER_SITE_OTHER): Payer: BLUE CROSS/BLUE SHIELD | Admitting: Emergency Medicine

## 2014-03-22 ENCOUNTER — Encounter: Payer: Self-pay | Admitting: Emergency Medicine

## 2014-03-22 ENCOUNTER — Ambulatory Visit (INDEPENDENT_AMBULATORY_CARE_PROVIDER_SITE_OTHER): Payer: BLUE CROSS/BLUE SHIELD | Admitting: *Deleted

## 2014-03-22 VITALS — BP 130/84 | HR 70 | Ht 72.0 in | Wt 220.0 lb

## 2014-03-22 DIAGNOSIS — R918 Other nonspecific abnormal finding of lung field: Secondary | ICD-10-CM

## 2014-03-22 DIAGNOSIS — J449 Chronic obstructive pulmonary disease, unspecified: Secondary | ICD-10-CM

## 2014-03-22 DIAGNOSIS — Z5181 Encounter for therapeutic drug level monitoring: Secondary | ICD-10-CM

## 2014-03-22 DIAGNOSIS — I4891 Unspecified atrial fibrillation: Secondary | ICD-10-CM

## 2014-03-22 LAB — POCT INR: INR: 2.8

## 2014-03-22 NOTE — Assessment & Plan Note (Signed)
He is only using his a normal when necessary. Discussed with him the importance of increasing this to every day on the schedule. He continues to smokes 3 cigarettes a day and we discussed strategies to try and decrease this to 0. He is motivated to do so.

## 2014-03-22 NOTE — Patient Instructions (Signed)
Please go back to taking the Anoro every day.  Keep albuterol available to use as needed for shortness of breath We need to keep working on decreasing the cigarettes. Our goal is to stop completely.  You are due for a repeat CT chest in May 2016. We will set this up with the help of Eric Form, NP.  Follow with Dr Lamonte Sakai in 4 months or sooner if you have any problems.

## 2014-03-22 NOTE — Assessment & Plan Note (Signed)
Identified  on low-dose CT in 10/14, most recent scan was in November 2015. His dominant nodules smaller but we are following other stable nodules. His next scan is scheduled for 06/04/2014. We will arrange for shared decision making visit documentation and the nf/u after the scan to review.

## 2014-03-22 NOTE — Progress Notes (Signed)
HPI:  64 yo smoker (25 pk-yrs), hx HTN, CHF, COPD dx in 2000. Prior admission for beer potomania and hyponatremia. Underwent screening LDCT on 10/06/12 for high risk pt. He has exertional dyspnea. He is using symbicort and spiriva depending on when he can get sample and how much work he is doing. His LDCT shows a 1.3cm LUL spiculated nodule. Here to discuss the abnormal CT.   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 No known hx TB exposure.   ROV 01/12/13 -- follows up for dyspnea/COPD, LUL nodule. PET performed 1/8 shows the LUL nodule is unchanged in size and is not hypermetabolic on PET. He was desaturated on walking into the office today. Still has cough prod of white. Worst when he lays down flat. He is on spiriva and symbicort > takes them when he able to afford; sounds like he is using symbicort more regularly than spiriva but neither of them reliably. He is hypertensive today. Medication compliance and cost is a huge issue here. He used to have oxygen but couldn't afford copay.   ROV 09/04/13 -- follows for her COPD and a left upper lobe nodule, negative on PET. Needs repeat CT scan 10/'15.  He is having trouble with a L parotid swelling and pain. He was treated with abx with some improvement. He is outside some, gardening. He is on spiriva, sometimes skips it. He also decreases his symbicort to qd. He is smoking 1pk/day. Not interested in quitting.   ROV 12/05/13 -- follows for his COPD, Also with a LUL nodule that we have followed on LDCT. Most recent scan 12/03/13 showed the LUL nodule was smaller, there is stable to slightly larger nodule in the RLL. He is having more exertional SOB. Last time we changed spiriva and symbicort to Anoro. He is desaturated on RA today.   ROV 03/22/14 -- follow up visit for tobacco, pulmonary nodules  (on LDCT), tobacco abuse. Presents today reporting that he has been doing well. Still smoking only 3 cig a day.  No flares, but he does state that he gets SOB with  significant exertion like yard work. He has a portable concentrator that is on 3L/min w exertion. He remains on Anoro but has not been taking every day.      Filed Vitals:   03/22/14 0902  BP: 130/84  Pulse: 70  Height: 6' (1.829 m)  Weight: 220 lb (99.791 kg)  SpO2: 90%   Gen: Pleasant, well-nourished, in no distress,  normal affect  ENT: No lesions,  mouth clear,  oropharynx clear, no postnasal drip  Neck: No JVD, no TMG, no carotid bruits  Lungs: No use of accessory muscles, distant, clear without rales or rhonchi  Cardiovascular: RRR, heart sounds normal, no murmur or gallops, no peripheral edema  Musculoskeletal: No deformities, no cyanosis or clubbing  Neuro: alert, non focal  Skin: Warm, no lesions or rashes   12/03/13 --  COMPARISON: PET 01/11/2013 and CT chest 10/05/2012.  FINDINGS: Mediastinal lymph nodes measure up to 1.6 cm in the lower right paratracheal station (previously 1.0 cm). Subcarinal lymph node measures 1.8 cm (previously 0.7 cm). Hilar regions are difficult to definitively evaluate without IV contrast but appear grossly unremarkable. Axillary lymph nodes are not enlarged by CT size criteria. Pulmonary arteries are borderline enlarged. Heart is mildly enlarged. Extensive 3 vessel coronary artery calcification. No pericardial effusion.  Moderate to severe centrilobular emphysema. There is a paraseptal component as well. A sub solid subpleural nodule in the  superior segment right lower lobe measures approximately 1.4 x 2.1 cm (series 5, image 197), compared to 1.0 x 2.4 cm on 10/05/2012. Irregular left upper lobe nodule appears slightly less prominent and is slightly different in configuration, with maximum transaxial measurements of 0.7 x 0.9 cm (image 98), previously 0.9 x 1.7 cm. No new pulmonary nodules. Basilar septal thickening and ground-glass with tiny bilateral pleural effusions, new. Airway is unremarkable.  Incidental imaging of  the upper abdomen shows the visualized portions of the liver, adrenal glands, spleen and stomach to be grossly unremarkable.  No worrisome lytic or sclerotic lesions. Degenerative changes are seen in the spine. Upper lumbar compression fracture is unchanged. Flowing anterior osteophytosis in the thoracic spine.  IMPRESSION: 1. Right lower lobe sub solid nodule is stable to minimally increased in size in the interval. Lung-RADS Category 3, probably benign findings. Short-term follow up in 6 months is recommended with low-dose chest CT without contrast (please use the following order, "CT chest low-dose screening follow-up"). These results will be called to the ordering clinician or representative by the Radiologist Assistant, and communication documented in the PACS or zVision Dashboard. 2. Mild congestive heart failure. 3. Mediastinal adenopathy is most likely reactive, in the setting of congestive heart failure. 4. Borderline enlarged pulmonary arteries and extensive 3 vessel coronary artery calcification.   Pulmonary nodules Identified  on low-dose CT in 10/14, most recent scan was in November 2015. His dominant nodules smaller but we are following other stable nodules. His next scan is scheduled for 06/04/2014. We will arrange for shared decision making visit documentation and the nf/u after the scan to review.    COPD (chronic obstructive pulmonary disease) He is only using his a normal when necessary. Discussed with him the importance of increasing this to every day on the schedule. He continues to smokes 3 cigarettes a day and we discussed strategies to try and decrease this to 0. He is motivated to do so.

## 2014-04-19 ENCOUNTER — Ambulatory Visit (INDEPENDENT_AMBULATORY_CARE_PROVIDER_SITE_OTHER): Payer: BLUE CROSS/BLUE SHIELD | Admitting: *Deleted

## 2014-04-19 DIAGNOSIS — Z5181 Encounter for therapeutic drug level monitoring: Secondary | ICD-10-CM | POA: Diagnosis not present

## 2014-04-19 DIAGNOSIS — I4891 Unspecified atrial fibrillation: Secondary | ICD-10-CM

## 2014-04-19 LAB — POCT INR: INR: 5.3

## 2014-05-01 ENCOUNTER — Ambulatory Visit (INDEPENDENT_AMBULATORY_CARE_PROVIDER_SITE_OTHER): Payer: BLUE CROSS/BLUE SHIELD | Admitting: Cardiovascular Disease

## 2014-05-01 ENCOUNTER — Encounter: Payer: Self-pay | Admitting: Cardiovascular Disease

## 2014-05-01 ENCOUNTER — Ambulatory Visit (INDEPENDENT_AMBULATORY_CARE_PROVIDER_SITE_OTHER): Payer: BLUE CROSS/BLUE SHIELD | Admitting: *Deleted

## 2014-05-01 VITALS — BP 146/90 | HR 73 | Ht 72.0 in | Wt 224.0 lb

## 2014-05-01 DIAGNOSIS — I5022 Chronic systolic (congestive) heart failure: Secondary | ICD-10-CM

## 2014-05-01 DIAGNOSIS — I4891 Unspecified atrial fibrillation: Secondary | ICD-10-CM

## 2014-05-01 DIAGNOSIS — Z5181 Encounter for therapeutic drug level monitoring: Secondary | ICD-10-CM | POA: Diagnosis not present

## 2014-05-01 LAB — POCT INR: INR: 3.3

## 2014-05-01 NOTE — Patient Instructions (Signed)
Medication Instructions:  Your physician recommends that you continue on your current medications as directed. Please refer to the Current Medication list given to you today.   Labwork: None  Testing/Procedures: None  Follow-Up: Your physician wants you to follow-up in: 6 months with Dr. Nahser.  You will receive a reminder letter in the mail two months in advance. If you don't receive a letter, please call our office to schedule the follow-up appointment.      

## 2014-05-01 NOTE — Progress Notes (Signed)
Cardiology Office Note   Date:  05/01/2014   ID:  Gerald Ho, DOB [birth year], MRN [id number]  PCP:  Maryella Shivers, MD  Cardiologist:   Thayer Headings, MD   Chief Complaint  Patient presents with  . Atrial Fibrillation    Problem List 1. Atrial fib 2. COPD  3. Moderate Pulmonary HTN   Gerald Ho is a 64 y.o. male who presents for follow up of his atrial fib. Hx of COPD  He was seen in the office a month ago and was found to have have A-fib. He was started on Eliquis but changed to coumadin because of cost. He may want to change back because of the cost of the INR checks.   He seems to be doing better.   He had an echo that showed right and left ventricular failure. Left ventricle: The cavity size was normal. Wall thickness was increased in a pattern of mild LVH. Indeterminant diastolic function (atrial fibrillation). The estimated ejection fraction was 35%. Diffuse hypokinesis. - Aortic valve: There was no stenosis. There was trivial regurgitation. - Aorta: Mildly dilated aortic root. Aortic root dimension: 39 mm (ED). - Mitral valve: There was trivial regurgitation. - Left atrium: The atrium was moderately to severely dilated. - Right ventricle: The cavity size was moderately dilated. Systolic function was moderately reduced. - Right atrium: The atrium was moderately dilated. - Tricuspid valve: Peak RV-RA gradient (S): 37 mm Hg. - Pulmonary arteries: PA peak pressure: 52 mm Hg (S). - Systemic veins: IVC measured 2.8 cm with < 50% respirophasic variation, suggesting RA pressure 15 mmHg.   Feb. 19, 2016:  Gerald Ho is a 64 y.o. male who presents for follow up of his atrial fib.  He has not had any problems.   He is been therapeutic with his Coumadin. We will schedule a cardioversion.  May 01, 2014:  He had a cardioversion in Feb.  Was successful     HR has been much better.    Past Medical History  Diagnosis Date  . Current  smoker     2 packs per day  . High blood pressure   . Emphysema     Past Surgical History  Procedure Laterality Date  . Lumbar disc surgery    . Cardioversion N/A 02/26/2014    Procedure: CARDIOVERSION;  Surgeon: Thayer Headings, MD;  Location: [facility] ENDOSCOPY;  Service: Cardiovascular;  Laterality: N/A;     Current Outpatient Prescriptions  Medication Sig Dispense Refill  . albuterol (PROVENTIL HFA;VENTOLIN HFA) 108 (90 BASE) MCG/ACT inhaler Inhale 2 puffs into the lungs every 6 (six) hours as needed for wheezing or shortness of breath. 1 Inhaler 6  . carvedilol (COREG) 12.5 MG tablet Take 1 tablet (12.5 mg total) by mouth 2 (two) times daily. 62 tablet 11  . cyanocobalamin (,VITAMIN B-12,) 1000 MCG/ML injection Inject 1,000 mcg into the muscle every 30 (thirty) days.    Marland Kitchen lamoTRIgine (LAMICTAL) 200 MG tablet Take 200 mg by mouth daily at 12 noon.     Marland Kitchen losartan (COZAAR) 50 MG tablet Take 50 mg by mouth daily at 12 noon.     . pravastatin (PRAVACHOL) 20 MG tablet Take 20 mg by mouth daily at 12 noon.     . QUEtiapine (SEROQUEL) 100 MG tablet Take 100 mg by mouth at bedtime.     . RABEprazole (ACIPHEX) 20 MG tablet Take 20 mg by mouth daily at 12 noon.     . Tetrahydrozoline HCl (  VISINE OP) Place 1 drop into both eyes daily as needed (dry eyes).    . traZODone (DESYREL) 100 MG tablet Take 100 mg by mouth at bedtime as needed for sleep.     Marland Kitchen Umeclidinium-Vilanterol (ANORO ELLIPTA) 62.5-25 MCG/INH AEPB Inhale 1 puff into the lungs daily. (Patient taking differently: Inhale 1 puff into the lungs daily as needed (shortness of breath/wheezing). ) 60 each 6  . warfarin (COUMADIN) 5 MG tablet Take as directed by Coumadin clinic (Patient taking differently: Take 5-7.5 mg by mouth daily at 12 noon. Take 1 1/2 tablets (7.5 mg) on Wednesday, take 1 tablet (5 mg) Thursday thru Tuesday) 120 tablet 1   No current facility-administered medications for this visit.    Allergies:   Review of patient's  allergies indicates no known allergies.    Social History:  The patient  reports that he has been smoking Cigarettes.  He has a 80 pack-year smoking history. He does not have any smokeless tobacco history on file. He reports that he drinks alcohol.   Family History:  The patient's family history includes AAA (abdominal aortic aneurysm) in his mother; Emphysema in his father; Heart attack in his father.    ROS:  Please see the history of present illness.    Review of Systems: Constitutional:  denies fever, chills, diaphoresis, appetite change and fatigue.  HEENT: denies photophobia, eye pain, redness, hearing loss, ear pain, congestion, sore throat, rhinorrhea, sneezing, neck pain, neck stiffness and tinnitus.  Respiratory: denies SOB, DOE, cough, chest tightness, and wheezing.  Cardiovascular: denies chest pain, palpitations and leg swelling.  Gastrointestinal: denies nausea, vomiting, abdominal pain, diarrhea, constipation, blood in stool.  Genitourinary: denies dysuria, urgency, frequency, hematuria, flank pain and difficulty urinating.  Musculoskeletal: denies  myalgias, back pain, joint swelling, arthralgias and gait problem.   Skin: denies pallor, rash and wound.  Neurological: denies dizziness, seizures, syncope, weakness, light-headedness, numbness and headaches.   Hematological: denies adenopathy, easy bruising, personal or family bleeding history.  Psychiatric/ Behavioral: denies suicidal ideation, mood changes, confusion, nervousness, sleep disturbance and agitation.       All other systems are reviewed and negative.    PHYSICAL EXAM: VS:  BP 146/90 mmHg  Pulse 73  Ht 6' (1.829 m)  Wt 224 lb (101.606 kg)  BMI 30.37 kg/m2 , BMI Body mass index is 30.37 kg/(m^2). GEN: Well nourished, well developed, in no acute distress HEENT: normal Neck: no JVD, carotid bruits, or masses Cardiac: RRR; no murmurs, rubs, or gallops,no edema  Respiratory:  clear to auscultation  bilaterally, normal work of breathing GI: soft, nontender,+ BS.  Abdomen is moderately distended.  MS: no deformity or atrophy Skin: warm and dry, no rash Neuro:  Strength and sensation are intact Psych: normal   EKG:  EKG is not ordered today.    Recent Labs: 02/22/2014: BUN 22; Creatinine 1.75*; Hemoglobin 15.0; Platelets 277.0; Potassium 4.7; Sodium 131*    Lipid Panel No results found for: CHOL, TRIG, HDL, CHOLHDL, VLDL, LDLCALC, LDLDIRECT    Wt Readings from Last 3 Encounters:  05/01/14 224 lb (101.606 kg)  03/22/14 220 lb (99.791 kg)  02/26/14 213 lb (96.616 kg)      Other studies Reviewed: Additional studies/ records that were reviewed today include: . Review of the above records demonstrates:    ASSESSMENT AND PLAN:  1.  Atrial fibrillation: he had a cardioversion in Feb.   Has maintained NSR ( by exam )  Continue coumadin.  Will see him in 6  months.  Will decide on coumadin duration based on that visi   2. Pulmonary HTN:  Encouraged him to stop smoking   3. COPD:  Encouraged him to stop smoking   Current medicines are reviewed at length with the patient today.  The patient does not have concerns regarding medicines.  The following changes have been made:  no change   Disposition:   FU with me in several months.     Signed, Nahser, Wonda Cheng, MD  05/01/2014 3:48 PM    [facility] [address], [place], [place]  [address] Phone: [phone]; Fax: [phone]

## 2014-05-15 ENCOUNTER — Ambulatory Visit (INDEPENDENT_AMBULATORY_CARE_PROVIDER_SITE_OTHER): Payer: BLUE CROSS/BLUE SHIELD | Admitting: *Deleted

## 2014-05-15 DIAGNOSIS — Z5181 Encounter for therapeutic drug level monitoring: Secondary | ICD-10-CM

## 2014-05-15 DIAGNOSIS — I4891 Unspecified atrial fibrillation: Secondary | ICD-10-CM

## 2014-05-15 LAB — POCT INR: INR: 2.7

## 2014-06-04 ENCOUNTER — Inpatient Hospital Stay: Admission: RE | Admit: 2014-06-04 | Payer: BC Managed Care – PPO | Source: Ambulatory Visit

## 2014-06-06 ENCOUNTER — Ambulatory Visit (INDEPENDENT_AMBULATORY_CARE_PROVIDER_SITE_OTHER)
Admission: RE | Admit: 2014-06-06 | Discharge: 2014-06-06 | Disposition: A | Discharge status: Home / Self Care | Payer: BLUE CROSS/BLUE SHIELD | Source: Ambulatory Visit | Attending: Emergency Medicine | Admitting: Emergency Medicine

## 2014-06-06 ENCOUNTER — Ambulatory Visit (INDEPENDENT_AMBULATORY_CARE_PROVIDER_SITE_OTHER): Payer: BLUE CROSS/BLUE SHIELD | Admitting: Pharmacist

## 2014-06-06 DIAGNOSIS — Z5181 Encounter for therapeutic drug level monitoring: Secondary | ICD-10-CM

## 2014-06-06 DIAGNOSIS — I4891 Unspecified atrial fibrillation: Secondary | ICD-10-CM

## 2014-06-06 DIAGNOSIS — R918 Other nonspecific abnormal finding of lung field: Secondary | ICD-10-CM

## 2014-06-06 LAB — POCT INR: INR: 2.7

## 2014-06-10 ENCOUNTER — Telehealth: Payer: Self-pay | Admitting: Acute Care

## 2014-06-10 NOTE — Telephone Encounter (Signed)
I called and spoke with Gerald Ho today about the results of his LDCT that was done 06/06/14. I explained that the CT showed inflammatory / post infectious changes and that the recommendation from the Radiologist was to repeat the scan in 6 months.The majority of the pulmonary nodules were stable, however there were 2 that required a short term follow up scan in 6 months. I told him I had also discussed the scan with Gerald Ho, who was in agreement with Gerald Ho recommendation .  He did not have any questions for me. We did discuss the fact that he was having trouble with his medical device supplier, [employer], and that he was without his Actibox while it was being repaired, and this had been the case for about a month. He is having to use tanks that only last about 3 hours, which is limiting his mobility. ( He uses O2 on exertion only). I told him he could call the main [facility] number [phone], and request another device supplier. He verbalized understanding and said he would do that if he did not get his Actibox back within the next week. I told Gerald Ho that he would hear from me in November to get the follow up scan scheduled.He verbalized understanding. I will also send a message to Gerald Ho as there was extensive atherosclerosis, including left main and 3 vessel coronary artery disease noted as an S modifier on the scan.

## 2014-07-04 ENCOUNTER — Ambulatory Visit (INDEPENDENT_AMBULATORY_CARE_PROVIDER_SITE_OTHER): Payer: BLUE CROSS/BLUE SHIELD | Admitting: *Deleted

## 2014-07-04 DIAGNOSIS — I4891 Unspecified atrial fibrillation: Secondary | ICD-10-CM | POA: Diagnosis not present

## 2014-07-04 DIAGNOSIS — Z5181 Encounter for therapeutic drug level monitoring: Secondary | ICD-10-CM

## 2014-07-04 LAB — POCT INR: INR: 3.4

## 2014-07-09 ENCOUNTER — Telehealth: Payer: Self-pay | Admitting: Cardiovascular Disease

## 2014-07-09 NOTE — Telephone Encounter (Signed)
Returned call to patient Dr.Nahser wants you to take both losartan and coreg.

## 2014-07-09 NOTE — Telephone Encounter (Signed)
NEwMessage  Pt calling about BP medication- Losartan and conflict w/ other prescription. Please call back and discuss.

## 2014-07-09 NOTE — Telephone Encounter (Signed)
He should take both the losartan and coreg

## 2014-07-09 NOTE — Telephone Encounter (Signed)
Returned call to patient he stated he wanted to make sure Dr.Nahser wanted him to take losartan since he started him on carvedilol.Stated he feels good.Advised he should continue losartan.Advised I will send message to Dr.Nahser for advice.

## 2014-07-25 ENCOUNTER — Ambulatory Visit (INDEPENDENT_AMBULATORY_CARE_PROVIDER_SITE_OTHER): Payer: BLUE CROSS/BLUE SHIELD | Admitting: *Deleted

## 2014-07-25 DIAGNOSIS — Z5181 Encounter for therapeutic drug level monitoring: Secondary | ICD-10-CM | POA: Diagnosis not present

## 2014-07-25 DIAGNOSIS — I4891 Unspecified atrial fibrillation: Secondary | ICD-10-CM

## 2014-07-25 LAB — POCT INR: INR: 3.2

## 2014-08-02 ENCOUNTER — Encounter: Payer: Self-pay | Admitting: Emergency Medicine

## 2014-08-02 ENCOUNTER — Ambulatory Visit (INDEPENDENT_AMBULATORY_CARE_PROVIDER_SITE_OTHER): Payer: BLUE CROSS/BLUE SHIELD | Admitting: Emergency Medicine

## 2014-08-02 VITALS — BP 112/60 | HR 63 | Ht 72.0 in | Wt 218.4 lb

## 2014-08-02 DIAGNOSIS — R918 Other nonspecific abnormal finding of lung field: Secondary | ICD-10-CM

## 2014-08-02 DIAGNOSIS — J449 Chronic obstructive pulmonary disease, unspecified: Secondary | ICD-10-CM | POA: Diagnosis not present

## 2014-08-02 DIAGNOSIS — Z720 Tobacco use: Secondary | ICD-10-CM

## 2014-08-02 NOTE — Assessment & Plan Note (Signed)
We are following pulmonary nodules and an inflammatory infiltrate on low-dose CT scan. His next scan is due for December

## 2014-08-02 NOTE — Assessment & Plan Note (Signed)
We discussed cessation in detail today. He is down to 3 cigarettes daily. We talked about setting a quit date and I offered our assistance in any way. I don't believe he is ready to set a quit date at this time but he will contemplated

## 2014-08-02 NOTE — Patient Instructions (Signed)
Please continue your Anoro daily Wear your oxygen at all times.  Take albuterol 2 puffs up to every 4 hours if needed for shortness of breath.  Get your repeat CT scan of the chest as planned. We will contact you with the results.  Follow with Dr Lamonte Sakai in 6 months or sooner if you have any problems

## 2014-08-02 NOTE — Progress Notes (Signed)
HPI:  64 yo smoker (32 pk-yrs), hx HTN, CHF, COPD dx in 2000. Prior admission for beer potomania and hyponatremia. Underwent screening LDCT on 10/06/12 for high risk pt. He has exertional dyspnea. He is using symbicort and spiriva depending on when he can get sample and how much work he is doing. His LDCT shows a 1.3cm LUL spiculated nodule. Here to discuss the abnormal CT.   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 No known hx TB exposure.   ROV 01/12/13 -- follows up for dyspnea/COPD, LUL nodule. PET performed 1/8 shows the LUL nodule is unchanged in size and is not hypermetabolic on PET. He was desaturated on walking into the office today. Still has cough prod of white. Worst when he lays down flat. He is on spiriva and symbicort > takes them when he able to afford; sounds like he is using symbicort more regularly than spiriva but neither of them reliably. He is hypertensive today. Medication compliance and cost is a huge issue here. He used to have oxygen but couldn't afford copay.   ROV 09/04/13 -- follows for her COPD and a left upper lobe nodule, negative on PET. Needs repeat CT scan 10/'15.  He is having trouble with a L parotid swelling and pain. He was treated with abx with some improvement. He is outside some, gardening. He is on spiriva, sometimes skips it. He also decreases his symbicort to qd. He is smoking 1pk/day. Not interested in quitting.   ROV 12/05/13 -- follows for his COPD, Also with a LUL nodule that we have followed on LDCT. Most recent scan 12/03/13 showed the LUL nodule was smaller, there is stable to slightly larger nodule in the RLL. He is having more exertional SOB. Last time we changed spiriva and symbicort to Anoro. He is desaturated on RA today.   ROV 03/22/14 -- follow up visit for tobacco, pulmonary nodules  (on LDCT), tobacco abuse. Presents today reporting that he has been doing well. Still smoking only 3 cig a day.  No flares, but he does state that he gets SOB with  significant exertion like yard work. He has a portable concentrator that is on 3L/min w exertion. He remains on Anoro but has not been taking every day.   ROV 08/02/14 -- follow-up visit for tobacco use, COPD. He has old inflammatory change and pulmonary nodules that we are following through the low-dose CT scan screening program. His most recent scan was performed on 06/06/14 and I reviewed this scan personally. It shows overall stability of the pulmonary nodules in question with exception of some increased GGI .   He is on 2-3L/min O2 via POC. He is scheduled for repeat scan end of the year. Smoking 3 cig a day.  Uses vapor and chewing tobacco to supplement. Remains on Anoro. Minimal cough. Able to exert some by limited by SOB. Recovers quickly w rest.      Filed Vitals:   08/02/14 0920  BP: 112/60  Pulse: 63  Height: 6' (1.829 m)  Weight: 218 lb 6.4 oz (99.066 kg)  SpO2: 90%   Gen: Pleasant, well-nourished, in no distress,  normal affect  ENT: No lesions,  mouth clear,  oropharynx clear, no postnasal drip  Neck: No JVD, no TMG, no carotid bruits  Lungs: No use of accessory muscles, distant, clear without rales or rhonchi  Cardiovascular: RRR, heart sounds normal, no murmur or gallops, no peripheral edema  Musculoskeletal: No deformities, no cyanosis or clubbing  Neuro:  alert, non focal  Skin: Warm, no lesions or rashes  06/05/24 -- LDCT COMPARISON: Low dose lung cancer screening CT 12/03/2013.  FINDINGS: Mediastinum/Lymph Nodes: Heart size is normal. There is no significant pericardial fluid, thickening or pericardial calcification. There is atherosclerosis of the thoracic aorta, the great vessels of the mediastinum and the coronary arteries, including calcified atherosclerotic plaque in the left main, left anterior descending, left circumflex and right coronary arteries. Borderline to mildly enlarged mediastinal lymph nodes measuring up to 13 mm in short axis in the low  right paratracheal nodal station, slightly decreased compared to the prior examination. Esophagus is unremarkable in appearance. No axillary lymphadenopathy.  Lungs/Pleura: Again noted are multiple lesions throughout the lungs bilaterally, some of which are small solid nodules while others are ground-glass attenuation or mixed solid and subsolid nodules. The majority of these lesions are stable in size and appearance compared to the prior examinations. The exception to this is a mixed solid and subsolid lesion in the superior segment of the left lower lobe (image 130 of series 5), which has a peripheral ground-glass attenuation component measuring 12.9 mm in mean diameter, with a central solid component measuring 9.9 mm in diameter (volumetrically derived), which is slightly larger than prior examination 12/03/2013 (at which point this nodule had actually decreased in size compared to the more remote prior examination from 10/05/2012). Overall, this is still favored to represent an area of post infectious or inflammatory scarring, particularly given the lack of hypermetabolism on prior PET-CT 01/11/2013. Additionally, in the posterior aspect of the superior segment of the right lower lobe (image 212 of series 5) there is a very ill-defined lesion that is predominantly ground-glass attenuation, which is difficult to accurately measure using standard or volumetric techniques, but appears grossly similar to the most recent prior examination from 12/03/2013, and is also favored to be post infectious or inflammatory in etiology. Mild diffuse bronchial wall thickening. Moderate centrilobular and mild paraseptal emphysema.  Upper Abdomen: Extensive atherosclerosis. Otherwise, unremarkable.  Musculoskeletal/Soft Tissues: Old compression fractures at T6 and L1, most severe at L1 where there is 30% loss of anterior vertebral body height, similar to prior examinations. There are no  aggressive appearing lytic or blastic lesions noted in the visualized portions of the skeleton.  IMPRESSION: 1. Lung-Rads category 3S, probably benign findings. Overall, today's study is very complicated, as detailed above, however, the lesions in the posterior left upper lobe and superior segment of the right lower lobe are both favored to be post infectious or inflammatory areas of scarring based on their behavior over the preceding examinations dating back to 10/05/2012. Short-term follow-up in 6 months is recommended with repeat low-dose chest CT without contrast (please use the following order, "CT CHEST LUNG CA SCREEN LOW DOSE W/O CM"). This was discussed by phone on 06/06/2014 with Dr. Lamonte Sakai by Dr. Weber Cooks at 5:15 p.m. 2. The "S" modifier above refers to potentially clinically significant non lung cancer related findings. Specifically, there is extensive atherosclerosis, including left main and 3 vessel coronary artery disease. Please note that although the presence of coronary artery calcium documents the presence of coronary artery disease, the severity of this disease and any potential stenosis cannot be assessed on this non-gated CT examination. Assessment for potential risk factor modification, dietary therapy or pharmacologic therapy may be warranted, if clinically indicated. 3. Mild diffuse bronchial thickening with moderate centrilobular and mild paraseptal emphysema; imaging findings suggestive of underlying COPD. Counseling for smoking cessation is strongly recommended.   COPD (chronic  obstructive pulmonary disease) Clinically he appears to be improved over the last year and certainly stable since our last visit 4 months ago. He is now using Anoro reliably. He uses oxygen reliably as well at 2 - 3 L/m. Most important thing for her to do now is to get him to stop smoking. We discussed this in detail today  Pulmonary nodules We are following pulmonary nodules and an  inflammatory infiltrate on low-dose CT scan. His next scan is due for December  Tobacco abuse disorder We discussed cessation in detail today. He is down to 3 cigarettes daily. We talked about setting a quit date and I offered our assistance in any way. I don't believe he is ready to set a quit date at this time but he will contemplated

## 2014-08-02 NOTE — Assessment & Plan Note (Signed)
Clinically he appears to be improved over the last year and certainly stable since our last visit 4 months ago. He is now using Anoro reliably. He uses oxygen reliably as well at 2 - 3 L/m. Most important thing for her to do now is to get him to stop smoking. We discussed this in detail today

## 2014-08-08 ENCOUNTER — Ambulatory Visit (INDEPENDENT_AMBULATORY_CARE_PROVIDER_SITE_OTHER): Payer: BLUE CROSS/BLUE SHIELD | Admitting: *Deleted

## 2014-08-08 DIAGNOSIS — Z5181 Encounter for therapeutic drug level monitoring: Secondary | ICD-10-CM

## 2014-08-08 DIAGNOSIS — I4891 Unspecified atrial fibrillation: Secondary | ICD-10-CM

## 2014-08-08 LAB — POCT INR: INR: 4.2

## 2014-08-22 ENCOUNTER — Ambulatory Visit (INDEPENDENT_AMBULATORY_CARE_PROVIDER_SITE_OTHER): Payer: BLUE CROSS/BLUE SHIELD | Admitting: *Deleted

## 2014-08-22 DIAGNOSIS — Z5181 Encounter for therapeutic drug level monitoring: Secondary | ICD-10-CM | POA: Diagnosis not present

## 2014-08-22 DIAGNOSIS — I4891 Unspecified atrial fibrillation: Secondary | ICD-10-CM | POA: Diagnosis not present

## 2014-08-22 LAB — POCT INR: INR: 2.4

## 2014-09-19 ENCOUNTER — Ambulatory Visit (INDEPENDENT_AMBULATORY_CARE_PROVIDER_SITE_OTHER): Payer: BLUE CROSS/BLUE SHIELD | Admitting: *Deleted

## 2014-09-19 DIAGNOSIS — I4891 Unspecified atrial fibrillation: Secondary | ICD-10-CM | POA: Diagnosis not present

## 2014-09-19 DIAGNOSIS — Z5181 Encounter for therapeutic drug level monitoring: Secondary | ICD-10-CM | POA: Diagnosis not present

## 2014-09-19 LAB — POCT INR: INR: 4.1

## 2014-10-04 ENCOUNTER — Ambulatory Visit (INDEPENDENT_AMBULATORY_CARE_PROVIDER_SITE_OTHER): Payer: BLUE CROSS/BLUE SHIELD | Admitting: *Deleted

## 2014-10-04 DIAGNOSIS — I4891 Unspecified atrial fibrillation: Secondary | ICD-10-CM | POA: Diagnosis not present

## 2014-10-04 DIAGNOSIS — Z5181 Encounter for therapeutic drug level monitoring: Secondary | ICD-10-CM

## 2014-10-04 LAB — POCT INR: INR: 2.9

## 2014-10-29 ENCOUNTER — Ambulatory Visit (INDEPENDENT_AMBULATORY_CARE_PROVIDER_SITE_OTHER): Payer: BLUE CROSS/BLUE SHIELD | Admitting: Pharmacist

## 2014-10-29 ENCOUNTER — Ambulatory Visit (INDEPENDENT_AMBULATORY_CARE_PROVIDER_SITE_OTHER): Payer: BLUE CROSS/BLUE SHIELD | Admitting: Cardiovascular Disease

## 2014-10-29 ENCOUNTER — Encounter: Payer: Self-pay | Admitting: Cardiovascular Disease

## 2014-10-29 ENCOUNTER — Telehealth: Payer: Self-pay | Admitting: Emergency Medicine

## 2014-10-29 ENCOUNTER — Other Ambulatory Visit: Payer: Self-pay | Admitting: *Deleted

## 2014-10-29 VITALS — BP 132/76 | HR 69 | Ht 72.0 in | Wt 227.4 lb

## 2014-10-29 DIAGNOSIS — I5022 Chronic systolic (congestive) heart failure: Secondary | ICD-10-CM

## 2014-10-29 DIAGNOSIS — Z5181 Encounter for therapeutic drug level monitoring: Secondary | ICD-10-CM

## 2014-10-29 DIAGNOSIS — I4891 Unspecified atrial fibrillation: Secondary | ICD-10-CM

## 2014-10-29 LAB — HEPATIC FUNCTION PANEL
ALT: 10 U/L (ref 9–46)
AST: 19 U/L (ref 10–35)
Albumin: 4 g/dL (ref 3.6–5.1)
Alkaline Phosphatase: 66 U/L (ref 40–115)
BILIRUBIN DIRECT: 0.1 mg/dL (ref <=0.2)
BILIRUBIN TOTAL: 0.6 mg/dL (ref 0.2–1.2)
Indirect Bilirubin: 0.5 mg/dL (ref 0.2–1.2)
Total Protein: 7.2 g/dL (ref 6.1–8.1)

## 2014-10-29 LAB — BASIC METABOLIC PANEL
BUN: 17 mg/dL (ref 7–25)
CALCIUM: 9 mg/dL (ref 8.6–10.3)
CO2: 25 mmol/L (ref 20–31)
CREATININE: 1.46 mg/dL — AB (ref 0.70–1.25)
Chloride: 98 mmol/L (ref 98–110)
GLUCOSE: 96 mg/dL (ref 65–99)
Potassium: 4.4 mmol/L (ref 3.5–5.3)
SODIUM: 132 mmol/L — AB (ref 135–146)

## 2014-10-29 LAB — LIPID PANEL
Cholesterol: 132 mg/dL (ref 125–200)
HDL: 32 mg/dL — ABNORMAL LOW (ref >=40)
LDL Cholesterol: 82 mg/dL (ref <130)
Total CHOL/HDL Ratio: 4.1 Ratio (ref <=5.0)
Triglycerides: 90 mg/dL (ref <150)
VLDL: 18 mg/dL (ref <30)

## 2014-10-29 LAB — POCT INR: INR: 4.1

## 2014-10-29 MED ORDER — UMECLIDINIUM-VILANTEROL 62.5-25 MCG/INH IN AEPB: 1.0000 | INHALATION_SPRAY | Freq: Every day | RESPIRATORY_TRACT | Status: DC

## 2014-10-29 NOTE — Patient Instructions (Signed)
Medication Instructions: Your physician recommends that you continue on your current medications as directed. Please refer to the Current Medication list given to you today.    Labwork: BMET LFT AND LIPIDS    Testing/Procedures: NONE ORDER TODAY    Follow-Up:   Your physician wants you to follow-up in:  [place] will receive a reminder letter in the mail two months in advance. If you don't receive a letter, please call our office to schedule the follow-up appointment.      Any Other Special Instructions Will Be Listed Below (If Applicable).

## 2014-10-29 NOTE — Progress Notes (Addendum)
Cardiology Office Note   Date:  10/29/2014   ID:  Gerald Ho, DOB November 26, 1950, MRN [id number]  PCP:  Maryella Shivers, MD  Cardiologist:   Thayer Headings, MD   Chief Complaint  Patient presents with  . Follow-up    atrial fib    Problem List 1. Atrial fib 2. COPD  3. Moderate Pulmonary HTN   Gerald Ho is a 64 y.o. male who presents for follow up of his atrial fib. Hx of COPD  He was seen in the office a month ago and was found to have have A-fib. He was started on Eliquis but changed to coumadin because of cost. He may want to change back because of the cost of the INR checks.   He seems to be doing better.   He had an echo that showed right and left ventricular failure. Left ventricle: The cavity size was normal. Wall thickness was increased in a pattern of mild LVH. Indeterminant diastolic function (atrial fibrillation). The estimated ejection fraction was 35%. Diffuse hypokinesis. - Aortic valve: There was no stenosis. There was trivial regurgitation. - Aorta: Mildly dilated aortic root. Aortic root dimension: 39 mm (ED). - Mitral valve: There was trivial regurgitation. - Left atrium: The atrium was moderately to severely dilated. - Right ventricle: The cavity size was moderately dilated. Systolic function was moderately reduced. - Right atrium: The atrium was moderately dilated. - Tricuspid valve: Peak RV-RA gradient (S): 37 mm Hg. - Pulmonary arteries: PA peak pressure: 52 mm Hg (S). - Systemic veins: IVC measured 2.8 cm with < 50% respirophasic variation, suggesting RA pressure 15 mmHg.   Feb. 19, 2016:  Gerald Ho is a 64 y.o. male who presents for follow up of his atrial fib.  He has not had any problems.   He is been therapeutic with his Coumadin. We will schedule a cardioversion.  May 01, 2014:  He had a cardioversion in Feb.  Was successful     HR has been much better.   Oct. 25, 2016:   Still maintaining NSR    Has reddish palms.  Does not look like a drug rash.  Still smoking  - 3-4 cigarettes a day .   Takes off his home O2 when he smokes    Past Medical History  Diagnosis Date  . Current smoker     2 packs per day  . High blood pressure   . Emphysema     Past Surgical History  Procedure Laterality Date  . Lumbar disc surgery    . Cardioversion N/A 02/26/2014    Procedure: CARDIOVERSION;  Surgeon: Thayer Headings, MD;  Location: [facility] ENDOSCOPY;  Service: Cardiovascular;  Laterality: N/A;     Current Outpatient Prescriptions  Medication Sig Dispense Refill  . albuterol (PROVENTIL HFA;VENTOLIN HFA) 108 (90 BASE) MCG/ACT inhaler Inhale 2 puffs into the lungs every 6 (six) hours as needed for wheezing or shortness of breath. 1 Inhaler 6  . carvedilol (COREG) 12.5 MG tablet Take 1 tablet (12.5 mg total) by mouth 2 (two) times daily. 62 tablet 11  . cyanocobalamin (,VITAMIN B-12,) 1000 MCG/ML injection Inject 1,000 mcg into the muscle every 30 (thirty) days.    Marland Kitchen lamoTRIgine (LAMICTAL) 200 MG tablet Take 200 mg by mouth daily at 12 noon.     Marland Kitchen losartan (COZAAR) 50 MG tablet Take 50 mg by mouth daily at 12 noon.     . pravastatin (PRAVACHOL) 20 MG tablet Take 20 mg by mouth  daily at 12 noon.     . QUEtiapine (SEROQUEL) 100 MG tablet Take 100 mg by mouth at bedtime.     . RABEprazole (ACIPHEX) 20 MG tablet Take 20 mg by mouth daily at 12 noon.     . Tetrahydrozoline HCl (VISINE OP) Place 1 drop into both eyes daily as needed (dry eyes).    . traZODone (DESYREL) 100 MG tablet Take 100 mg by mouth at bedtime as needed for sleep.     Marland Kitchen Umeclidinium-Vilanterol (ANORO ELLIPTA) 62.5-25 MCG/INH AEPB Inhale 1 puff into the lungs daily. 60 each 5  . warfarin (COUMADIN) 5 MG tablet Take as directed by Coumadin clinic (Patient taking differently: Take 5-7.5 mg by mouth daily at 12 noon. Take 1 1/2 tablets (7.5 mg) on Wednesday, take 1 tablet (5 mg) Thursday thru Tuesday) 120 tablet 1   No current  facility-administered medications for this visit.    Allergies:   Review of patient's allergies indicates no known allergies.    Social History:  The patient  reports that he has been smoking Cigarettes.  He has a 80 pack-year smoking history. He does not have any smokeless tobacco history on file. He reports that he drinks alcohol.   Family History:  The patient's family history includes AAA (abdominal aortic aneurysm) in his mother; Emphysema in his father; Heart attack in his father.    ROS:  Please see the history of present illness.    Review of Systems: Constitutional:  denies fever, chills, diaphoresis, appetite change and fatigue.  HEENT: denies photophobia, eye pain, redness, hearing loss, ear pain, congestion, sore throat, rhinorrhea, sneezing, neck pain, neck stiffness and tinnitus.  Respiratory: denies SOB, DOE, cough, chest tightness, and wheezing.  Cardiovascular: denies chest pain, palpitations and leg swelling.  Gastrointestinal: denies nausea, vomiting, abdominal pain, diarrhea, constipation, blood in stool.  Genitourinary: denies dysuria, urgency, frequency, hematuria, flank pain and difficulty urinating.  Musculoskeletal: denies  myalgias, back pain, joint swelling, arthralgias and gait problem.   Skin: denies pallor, rash and wound.  Neurological: denies dizziness, seizures, syncope, weakness, light-headedness, numbness and headaches.   Hematological: denies adenopathy, easy bruising, personal or family bleeding history.  Psychiatric/ Behavioral: denies suicidal ideation, mood changes, confusion, nervousness, sleep disturbance and agitation.       All other systems are reviewed and negative.    PHYSICAL EXAM: VS:  BP 132/76 mmHg  Pulse 69  Ht 6' (1.829 m)  Wt 227 lb 6.4 oz (103.148 kg)  BMI 30.83 kg/m2 , BMI Body mass index is 30.83 kg/(m^2). GEN: Well nourished, well developed, in no acute distress HEENT: normal Neck: no JVD, carotid bruits, or  masses Cardiac: RRR; no murmurs, rubs, or gallops,no edema  Respiratory:  clear to auscultation bilaterally, normal work of breathing GI: soft, nontender,+ BS.  Abdomen is moderately distended.  MS: no deformity or atrophy Skin: warm and dry, no rash Neuro:  Strength and sensation are intact Psych: normal   EKG:  EKG is not ordered today.    Recent Labs: 02/22/2014: BUN 22; Creatinine, Ser 1.75*; Hemoglobin 15.0; Platelets 277.0; Potassium 4.7; Sodium 131*    Lipid Panel No results found for: CHOL, TRIG, HDL, CHOLHDL, VLDL, LDLCALC, LDLDIRECT    Wt Readings from Last 3 Encounters:  10/29/14 227 lb 6.4 oz (103.148 kg)  08/02/14 218 lb 6.4 oz (99.066 kg)  05/01/14 224 lb (101.606 kg)      Other studies Reviewed: Additional studies/ records that were reviewed today include: . Review of the  above records demonstrates:   EKG: 10/29/2014: Normal sinus rhythm at 69. Possible inferior wall myocardial infarction.   ASSESSMENT AND PLAN:  1.  Atrial fibrillation: he had a cardioversion in Feb.   Has maintained NSR ( by exam )  Continue coumadin.  Will see him in 6 months.  Will decide on coumadin duration based on that visit Needs to have a colonoscopy in Dec. 8.   Have given permission to hold the coumadin for 5 days prior to colonoscopy.   2. Pulmonary HTN:  Encouraged him to stop smoking   3. COPD:  Encouraged him to stop smoking   4.   Current medicines are reviewed at length with the patient today.  The patient does not have concerns regarding medicines.  The following changes have been made:  no change   Disposition:   FU with me in  1 year    Signed, Regenia Erck, Wonda Cheng, MD  10/29/2014 8:57 AM    [facility] [address], [place], [place]  [address] Phone: [phone]; Fax: [phone]

## 2014-10-29 NOTE — Telephone Encounter (Signed)
Please let the patient know that we will work on scheduling his screening CT scan for December. I will forward this message to Eric Form, AC-NP

## 2014-10-29 NOTE — Telephone Encounter (Signed)
Spoke with pt, states he's due to have a follow up CT chest in November.  Pt had a ct chest low dose screening ct in June 2016.  Pt states he needs to have his ct done before 2017.  RB please advise on what type of follow up ct you prefer pt to have, and when.  Thanks!

## 2014-10-30 ENCOUNTER — Other Ambulatory Visit: Payer: Self-pay | Admitting: Acute Care

## 2014-10-30 DIAGNOSIS — F17210 Nicotine dependence, cigarettes, uncomplicated: Principal | ICD-10-CM

## 2014-10-30 NOTE — Telephone Encounter (Signed)
Spoke with pt. Advised him that we will get his CT done before the end of the year.

## 2014-11-19 ENCOUNTER — Ambulatory Visit (INDEPENDENT_AMBULATORY_CARE_PROVIDER_SITE_OTHER): Payer: BLUE CROSS/BLUE SHIELD | Admitting: *Deleted

## 2014-11-19 DIAGNOSIS — Z5181 Encounter for therapeutic drug level monitoring: Secondary | ICD-10-CM

## 2014-11-19 DIAGNOSIS — I4891 Unspecified atrial fibrillation: Secondary | ICD-10-CM | POA: Diagnosis not present

## 2014-11-19 LAB — POCT INR: INR: 2.5

## 2014-12-05 ENCOUNTER — Telehealth: Payer: Self-pay | Admitting: Cardiovascular Disease

## 2014-12-05 NOTE — Telephone Encounter (Signed)
Have also faxed over clearance form on 12/05/14 - Pt's CHADS2 score is 2, ok to hold Coumadin 5 days and restart 12/12/14 and restart that PM.

## 2014-12-05 NOTE — Telephone Encounter (Signed)
Please see Dr Elmarie Shiley 10/29/14 office note, Dr Acie Fredrickson has given permission to hold coumadin 5 days prior to colonoscopy.  I will forward to CV[facility] for review.

## 2014-12-05 NOTE — Telephone Encounter (Signed)
New message    Request for surgical clearance:  1. What type of surgery is being performed? colonscopy   2. When is this surgery scheduled? 12.8.2016   3. Are there any medications that need to be held prior to surgery and how long? Coumadin / recommendation from Dr. Acie Fredrickson   4. Name of physician performing surgery? Dr. Earlean Shawl   5. What is your office phone and fax number? [phone] /  fax [phone]

## 2014-12-05 NOTE — Telephone Encounter (Signed)
Amy from Dr [facility] office called to request surgical clearance for pt with Dr Thana Farr. Pt is scheduled for  Colonoscopy on 12/12/14. Last office visit with Dr. Acie Fredrickson was 10/29/14. Pt takes coumadin. The Surgeon would like for pt to stop taken Coumadin for possible 5 days prior procedure.

## 2014-12-13 ENCOUNTER — Other Ambulatory Visit: Payer: Self-pay | Admitting: Acute Care

## 2014-12-16 ENCOUNTER — Other Ambulatory Visit: Payer: Self-pay | Admitting: Acute Care

## 2014-12-17 ENCOUNTER — Encounter: Payer: Self-pay | Admitting: Acute Care

## 2014-12-17 ENCOUNTER — Ambulatory Visit
Admission: RE | Admit: 2014-12-17 | Discharge: 2014-12-17 | Disposition: A | Discharge status: Home / Self Care | Payer: BLUE CROSS/BLUE SHIELD | Source: Ambulatory Visit | Attending: Acute Care | Admitting: Acute Care

## 2014-12-17 ENCOUNTER — Ambulatory Visit (INDEPENDENT_AMBULATORY_CARE_PROVIDER_SITE_OTHER): Payer: BLUE CROSS/BLUE SHIELD | Admitting: Acute Care

## 2014-12-17 DIAGNOSIS — F17210 Nicotine dependence, cigarettes, uncomplicated: Secondary | ICD-10-CM

## 2014-12-17 NOTE — Progress Notes (Signed)
Shared Decision Making Visit Lung Cancer Screening Program [phone])   Eligibility:  Age 64 y.o.  Pack Years Smoking History Calculation 81 pack year smoker (# packs/per year x # years smoked)  Recent History of coughing up blood  no  Unexplained weight loss? no ( >Than 15 pounds within the last 6 months )  Prior History Lung / other cancer no (Diagnosis within the last 5 years already requiring surveillance chest CT Scans).  Smoking Status Current Smoker  Former Smokers: Years since quit: Current smoker  Quit Date: NA  Visit Components:  Discussion included one or more decision making aids. yes  Discussion included risk/benefits of screening. yes  Discussion included potential follow up diagnostic testing for abnormal scans. yes  Discussion included meaning and risk of over diagnosis. yes  Discussion included meaning and risk of False Positives. yes  Discussion included meaning of total radiation exposure. yes  Counseling Included:  Importance of adherence to annual lung cancer LDCT screening. yes  Impact of comorbidities on ability to participate in the program. yes  Ability and willingness to under diagnostic treatment. yes  Smoking Cessation Counseling:  Current Smokers:   Discussed importance of smoking cessation. yes  Information about tobacco cessation classes and interventions provided to patient. yes  Patient provided with "ticket" for LDCT Scan. yes  Symptomatic Patient. no  Counseling :NA  Diagnosis Code: Tobacco Use Z72.0  Asymptomatic Patient yes  Counseling (Intermediate counseling: > three minutes counseling) L2440  Former Smokers:   Discussed the importance of maintaining cigarette abstinence. NA  Diagnosis Code: Personal History of Nicotine Dependence. N02.725  Information about tobacco cessation classes and interventions provided to patient. Yes  Patient provided with "ticket" for LDCT Scan. yes  Written Order for Lung Cancer  Screening with LDCT placed in Epic. Yes (CT Chest Lung Cancer Screening Low Dose W/O CM) [id number] Z12.2-Screening of respiratory organs Z87.891-Personal history of nicotine dependence  I spent 15 minutes of face to face time with Gerald Ho discussing the risks and benefits of lung cancer screening. We viewed a power point together that discussed in detail the above noted topics. We stopped and paused at intervals to allow for questions to be asked and answered to ensure understanding. We discussed that the single most powerful action that he can take to decrease his risk of developing lung cancer is to quit smoking. He is not ready to set a quit date. We discussed nicotine replacement therapy, non-nicotine medications, support groups, and other resources available to help liberate him from cigarettes. I have given him the " Be stronger than your excuses card" with resources and contact numbers for free nicotine replacement therapy, Quit Smart Classes, and online smoking cessation help through the [employer]. We also discussed setting smaller achievable goals  Of  decreasing the number of cigarettes smoked per day down by 1 a week, and then by 2, to slowly decrease the number of cigarettes smoked in a gradual manner. I have told him to call me when he is ready to quit, and that I will help him and support him in any way I can to make sure he has the tools to achieve his goal.I have given him my card and contact information. We discussed the time and location of the scan appointment, and that either June Leap, CMA or myself will call him with the results within 24-48 hours of having the results ourselves. I have given him a copy of the power point we viewed together to  use as a resource, but have encouraged him to call me with any questions. Gerald Ho verbalized understanding of all of the above and had no further questions upon leaving the office.    Magdalen Spatz, NP

## 2014-12-19 ENCOUNTER — Telehealth: Payer: Self-pay | Admitting: *Deleted

## 2014-12-19 ENCOUNTER — Other Ambulatory Visit: Payer: Self-pay | Admitting: Acute Care

## 2014-12-19 ENCOUNTER — Telehealth: Payer: Self-pay | Admitting: Acute Care

## 2014-12-19 DIAGNOSIS — R918 Other nonspecific abnormal finding of lung field: Secondary | ICD-10-CM

## 2014-12-19 NOTE — Telephone Encounter (Signed)
Pt called stating had colonoscopy on 12/12/2014 and pt states polyps were removed and he was instructed by Dr Earlean Shawl not to restart coumadin for 2 weeks This nurse called Dr [facility] office and spoke with  Renea and she states he did have polyps removed and she will message Dr Earlean Shawl regarding holding coumadin

## 2014-12-19 NOTE — Telephone Encounter (Signed)
Left message at Dr [facility] office for [facility] to return call to coumadin clinic regarding  what Dr Earlean Shawl wants pt  to do regarding coumadin

## 2014-12-19 NOTE — Telephone Encounter (Signed)
Received call back from [place] at Dr Thana Farr office and she states that Dr Thana Farr wants him to restart his coumadin now. This nurse called pt and informed him that he needs to restart couamdin today and want him to take coumadin 7.'5mg'$  today the 15th and take coumadin '5mg'$  tomorrow Dec 16th and continue previous dose of coumadin '5mg'$  daily except 2.'5mg'$  Mondays Wednesdays and Fridays and keep his appt in coumadin clinic on Dec 20th and pt states understanding

## 2014-12-19 NOTE — Telephone Encounter (Signed)
I have called Mr. Gerald Ho with the results of his LDCT. I explained that a lung rads 4B indicates suspicious nodules that need further diagnostic work up. I also explained that Dr. Lamonte Sakai has personally reviewed his scan and would like to have a PET scan done as follow up to the nodular changes  Noted in the most recent CT done 12/17/14. Mr. Standing verbalized understanding. He is scheduled for a PET scan 12/26/14.

## 2014-12-24 ENCOUNTER — Ambulatory Visit (INDEPENDENT_AMBULATORY_CARE_PROVIDER_SITE_OTHER): Payer: BLUE CROSS/BLUE SHIELD | Admitting: *Deleted

## 2014-12-24 DIAGNOSIS — Z5181 Encounter for therapeutic drug level monitoring: Secondary | ICD-10-CM

## 2014-12-24 DIAGNOSIS — I4891 Unspecified atrial fibrillation: Secondary | ICD-10-CM

## 2014-12-24 LAB — POCT INR: INR: 1.7

## 2014-12-26 ENCOUNTER — Ambulatory Visit (HOSPITAL_COMMUNITY)
Admission: RE | Admit: 2014-12-26 | Discharge: 2014-12-26 | Disposition: A | Discharge status: Home / Self Care | Payer: BLUE CROSS/BLUE SHIELD | Source: Ambulatory Visit | Attending: Acute Care | Admitting: Acute Care

## 2014-12-26 ENCOUNTER — Other Ambulatory Visit: Payer: Self-pay | Admitting: Acute Care

## 2014-12-26 ENCOUNTER — Telehealth: Payer: Self-pay | Admitting: Acute Care

## 2014-12-26 DIAGNOSIS — J439 Emphysema, unspecified: Secondary | ICD-10-CM | POA: Insufficient documentation

## 2014-12-26 DIAGNOSIS — R918 Other nonspecific abnormal finding of lung field: Secondary | ICD-10-CM | POA: Diagnosis not present

## 2014-12-26 DIAGNOSIS — K8020 Calculus of gallbladder without cholecystitis without obstruction: Secondary | ICD-10-CM | POA: Insufficient documentation

## 2014-12-26 DIAGNOSIS — I714 Abdominal aortic aneurysm, without rupture: Secondary | ICD-10-CM | POA: Diagnosis not present

## 2014-12-26 LAB — GLUCOSE, CAPILLARY: GLUCOSE-CAPILLARY: 115 mg/dL — AB (ref 65–99)

## 2014-12-26 MED ORDER — FLUDEOXYGLUCOSE F - 18 (FDG) INJECTION
12.0100 | Freq: Once | INTRAVENOUS | Status: AC | PRN
  Administered 2014-12-26: 12.01 via INTRAVENOUS

## 2014-12-26 NOTE — Telephone Encounter (Signed)
I have called the patient to let him know that Dr. Lamonte Sakai wants [place] to get PFT's in addition to the PET scan. He verbalized understanding , and asked if we could get them done before the new year. I have passed this on to the PCC's. He understands that he will get a call with the time and location of the appointment.

## 2015-01-02 ENCOUNTER — Ambulatory Visit (HOSPITAL_COMMUNITY)
Admission: RE | Admit: 2015-01-02 | Discharge: 2015-01-02 | Disposition: A | Discharge status: Home / Self Care | Payer: BLUE CROSS/BLUE SHIELD | Source: Ambulatory Visit | Attending: Acute Care | Admitting: Acute Care

## 2015-01-02 ENCOUNTER — Telehealth: Payer: Self-pay | Admitting: Acute Care

## 2015-01-02 DIAGNOSIS — R918 Other nonspecific abnormal finding of lung field: Secondary | ICD-10-CM | POA: Diagnosis present

## 2015-01-02 LAB — PULMONARY FUNCTION TEST
DL/VA % PRED: 32 %
DL/VA: 1.53 ml/min/mmHg/L
DLCO UNC: 8.92 ml/min/mmHg
DLCO unc % pred: 25 %
FEF 25-75 POST: 1 L/s
FEF 25-75 Pre: 0.65 L/sec
FEF2575-%Change-Post: 54 %
FEF2575-%PRED-PRE: 22 %
FEF2575-%Pred-Post: 33 %
FEV1-%CHANGE-POST: 4 %
FEV1-%Pred-Post: 58 %
FEV1-%Pred-Pre: 55 %
FEV1-PRE: 2.07 L
FEV1-Post: 2.16 L
FEV1FVC-%CHANGE-POST: -1 %
FEV1FVC-%Pred-Pre: 76 %
FEV6-%Change-Post: 8 %
FEV6-%PRED-PRE: 66 %
FEV6-%Pred-Post: 72 %
FEV6-POST: 3.43 L
FEV6-Pre: 3.16 L
FEV6FVC-%Change-Post: 3 %
FEV6FVC-%PRED-POST: 95 %
FEV6FVC-%Pred-Pre: 92 %
FVC-%Change-Post: 5 %
FVC-%PRED-PRE: 72 %
FVC-%Pred-Post: 76 %
FVC-POST: 3.81 L
FVC-PRE: 3.6 L
Post FEV1/FVC ratio: 57 %
Post FEV6/FVC ratio: 90 %
Pre FEV1/FVC ratio: 57 %
Pre FEV6/FVC Ratio: 88 %
RV % pred: 103 %
RV: 2.52 L
TLC % PRED: 83 %
TLC: 6.22 L

## 2015-01-02 MED ORDER — ALBUTEROL SULFATE (2.5 MG/3ML) 0.083% IN NEBU
2.5000 mg | INHALATION_SOLUTION | Freq: Once | RESPIRATORY_TRACT | Status: AC
  Administered 2015-01-02: 2.5 mg via RESPIRATORY_TRACT

## 2015-01-02 NOTE — Telephone Encounter (Signed)
I called to tell Gerald Ho that we had reviewed his PET scan this morning in the thoracic conference and that the multi-disciplinary group felt the best next step was a biopsy. They agreed that since Dr. Lamonte Sakai knows him best, he should determine the best route of biopsy. I told Gerald Ho that I will discuss this with Dr. Lamonte Sakai next week and we will schedule him based on Dr. Agustina Caroli recommendations. Gerald Ho verbalized understanding of the above. I told him we will call him next week with a plan. He verbalized understanding.

## 2015-01-09 ENCOUNTER — Telehealth: Payer: Self-pay | Admitting: Emergency Medicine

## 2015-01-09 NOTE — Telephone Encounter (Signed)
ATC pt line is busy x 3 WCB

## 2015-01-09 NOTE — Telephone Encounter (Signed)
LMOMTCB x 1 

## 2015-01-09 NOTE — Telephone Encounter (Signed)
lmtcb x3 for pt. 

## 2015-01-09 NOTE — Telephone Encounter (Signed)
Pt returning call and can be reached @ same.Stanley A Dalton ° °

## 2015-01-10 ENCOUNTER — Telehealth: Payer: Self-pay | Admitting: Acute Care

## 2015-01-10 NOTE — Telephone Encounter (Signed)
I have called Gerald Ho and told him that we are planning to do a biopsy of his lung nodule under general anesthesia. We are in the process of scheduling the procedure. I have told him that because we do not know the date, he is to continue taking his coumadin as prescribed. He is not to stop taking his coumadin in preparation for the biopsy until we have confirmed the time and date of the biopsy. He verbalized understanding of the above and that we are actively trying to get the procedure scheduled.

## 2015-01-10 NOTE — Telephone Encounter (Signed)
We've discussed case, note recs from [employer]. Best initial plan will be bronchoscopy under general anesthesia. Referral to [facility] and scheduling are being worked on. I will forward this to S. Groce to check status and to get the plan detail to Mr Schreier when they are available.

## 2015-01-10 NOTE — Telephone Encounter (Signed)
Per 01/02/15 phone note: Gerald Spatz, NP at 01/02/2015 4:31 PM     Status: Signed       Expand All Collapse All   I called to tell Gerald Ho that we had reviewed his PET scan this morning in the thoracic conference and that the multi-disciplinary group felt the best next step was a biopsy. They agreed that since Dr. Lamonte Sakai knows him best, he should determine the best route of biopsy. I told Gerald Ho that I will discuss this with Dr. Lamonte Sakai next week and we will schedule him based on Dr. Agustina Caroli recommendations. Gerald Ho verbalized understanding of the above. I told him we will call him next week with a plan. He verbalized understanding      ---  Pt is calling to check on status of a biopsy getting scheduled. Pt reports he has not heard from anyone. Please advise Sarah and Dr. Lamonte Sakai  thanks

## 2015-01-13 ENCOUNTER — Telehealth: Payer: Self-pay | Admitting: Acute Care

## 2015-01-13 NOTE — Telephone Encounter (Signed)
Gerald Ho may hold his coumadin for 5 days prior to procedure. He does not need bridging Our Coumadin clinic will provide specific details

## 2015-01-13 NOTE — Telephone Encounter (Signed)
I called and spoke with Mr. Gerald Ho. I told him the date for the biopsy will be 01/29/15 at 8:30 am and that the scheduling center will call him with the specifics of where to be and at what time. I have told him he needs to speak with Dr. Manley Ho or the nurses in the coumadin clinic regarding holding his coumadin vs. Being bridged for the procedure. He stated that he has an appointment on Thursday, 01/16/15 and that he will ask them then. I will call the clinic and let them know the plan so they can consult with Dr. Manley Ho regarding the plan for anticoagulation prior to during and after the procedure. Mr. Gerald Ho verbalized understanding of all of the above, and had no further questions.

## 2015-01-13 NOTE — Telephone Encounter (Signed)
Dr. Manley Mason,  Mr. Gerald Ho is a patient we share. He is scheduled for an EBUS / ENB on 01/29/15 for lung biopsy of a nodule that is PET positive. Dr. Lamonte Sakai is doing the procedure. Please advise on your preferred management of his coumadin. We want to know if you want coumadin held for 5 days, or if you would like him bridged? I have called the coumadin clinic and they suggested we communicate and document recommendations in phone notes. Thanks so much.

## 2015-01-14 ENCOUNTER — Telehealth: Payer: Self-pay | Admitting: Acute Care

## 2015-01-14 NOTE — Telephone Encounter (Signed)
Thanks so much for your recommendations.Gerald Ho is scheduled at the coumadin clinic this week. I will touch base with the nurses there.

## 2015-01-16 ENCOUNTER — Ambulatory Visit (INDEPENDENT_AMBULATORY_CARE_PROVIDER_SITE_OTHER): Payer: BLUE CROSS/BLUE SHIELD | Admitting: *Deleted

## 2015-01-16 DIAGNOSIS — Z5181 Encounter for therapeutic drug level monitoring: Secondary | ICD-10-CM

## 2015-01-16 DIAGNOSIS — I4891 Unspecified atrial fibrillation: Secondary | ICD-10-CM | POA: Diagnosis not present

## 2015-01-16 LAB — POCT INR: INR: 3

## 2015-01-22 ENCOUNTER — Telehealth: Payer: Self-pay | Admitting: Emergency Medicine

## 2015-01-22 NOTE — Telephone Encounter (Signed)
Spoke with Sharyn Lull. Advised her that RB is out of the office until Monday. Gave her his pager number and cell number. Nothing further was needed.

## 2015-01-23 ENCOUNTER — Other Ambulatory Visit: Payer: Self-pay

## 2015-01-23 ENCOUNTER — Encounter (HOSPITAL_COMMUNITY)
Admission: RE | Admit: 2015-01-23 | Discharge: 2015-01-23 | Disposition: A | Discharge status: Home / Self Care | Payer: BLUE CROSS/BLUE SHIELD | Source: Ambulatory Visit | Attending: Emergency Medicine | Admitting: Emergency Medicine

## 2015-01-23 ENCOUNTER — Other Ambulatory Visit (HOSPITAL_COMMUNITY): Payer: BLUE CROSS/BLUE SHIELD

## 2015-01-23 ENCOUNTER — Encounter (HOSPITAL_COMMUNITY): Payer: Self-pay

## 2015-01-23 DIAGNOSIS — I4891 Unspecified atrial fibrillation: Secondary | ICD-10-CM | POA: Diagnosis not present

## 2015-01-23 DIAGNOSIS — F17200 Nicotine dependence, unspecified, uncomplicated: Secondary | ICD-10-CM | POA: Diagnosis not present

## 2015-01-23 DIAGNOSIS — Z9981 Dependence on supplemental oxygen: Secondary | ICD-10-CM | POA: Insufficient documentation

## 2015-01-23 DIAGNOSIS — K219 Gastro-esophageal reflux disease without esophagitis: Secondary | ICD-10-CM | POA: Diagnosis not present

## 2015-01-23 DIAGNOSIS — R918 Other nonspecific abnormal finding of lung field: Secondary | ICD-10-CM | POA: Insufficient documentation

## 2015-01-23 DIAGNOSIS — Z01818 Encounter for other preprocedural examination: Secondary | ICD-10-CM | POA: Diagnosis present

## 2015-01-23 DIAGNOSIS — I10 Essential (primary) hypertension: Secondary | ICD-10-CM | POA: Insufficient documentation

## 2015-01-23 DIAGNOSIS — Z01812 Encounter for preprocedural laboratory examination: Secondary | ICD-10-CM | POA: Insufficient documentation

## 2015-01-23 DIAGNOSIS — I440 Atrioventricular block, first degree: Secondary | ICD-10-CM | POA: Insufficient documentation

## 2015-01-23 DIAGNOSIS — Z7901 Long term (current) use of anticoagulants: Secondary | ICD-10-CM | POA: Diagnosis not present

## 2015-01-23 DIAGNOSIS — F319 Bipolar disorder, unspecified: Secondary | ICD-10-CM | POA: Insufficient documentation

## 2015-01-23 DIAGNOSIS — J439 Emphysema, unspecified: Secondary | ICD-10-CM | POA: Insufficient documentation

## 2015-01-23 DIAGNOSIS — Z79899 Other long term (current) drug therapy: Secondary | ICD-10-CM | POA: Diagnosis not present

## 2015-01-23 DIAGNOSIS — E785 Hyperlipidemia, unspecified: Secondary | ICD-10-CM | POA: Diagnosis not present

## 2015-01-23 HISTORY — DX: Reserved for inherently not codable concepts without codable children: IMO0001

## 2015-01-23 HISTORY — DX: Pneumonia, unspecified organism: J18.9

## 2015-01-23 HISTORY — DX: Bipolar disorder, unspecified: F31.9

## 2015-01-23 HISTORY — DX: Hyperlipidemia, unspecified: E78.5

## 2015-01-23 HISTORY — DX: Cardiac arrhythmia, unspecified: I49.9

## 2015-01-23 HISTORY — DX: Gastro-esophageal reflux disease without esophagitis: K21.9

## 2015-01-23 LAB — COMPREHENSIVE METABOLIC PANEL
ALBUMIN: 3.7 g/dL (ref 3.5–5.0)
ALT: 13 U/L — ABNORMAL LOW (ref 17–63)
AST: 17 U/L (ref 15–41)
Alkaline Phosphatase: 67 U/L (ref 38–126)
Anion gap: 9 (ref 5–15)
BILIRUBIN TOTAL: 0.6 mg/dL (ref 0.3–1.2)
BUN: 12 mg/dL (ref 6–20)
CHLORIDE: 102 mmol/L (ref 101–111)
CO2: 21 mmol/L — AB (ref 22–32)
Calcium: 9.1 mg/dL (ref 8.9–10.3)
Creatinine, Ser: 1.31 mg/dL — ABNORMAL HIGH (ref 0.61–1.24)
GFR calc Af Amer: >60 mL/min (ref >60)
GFR calc non Af Amer: 56 mL/min — ABNORMAL LOW (ref >60)
GLUCOSE: 92 mg/dL (ref 65–99)
POTASSIUM: 4.2 mmol/L (ref 3.5–5.1)
SODIUM: 132 mmol/L — AB (ref 135–145)
TOTAL PROTEIN: 7.1 g/dL (ref 6.5–8.1)

## 2015-01-23 LAB — CBC
HEMATOCRIT: 37.6 % — AB (ref 39.0–52.0)
Hemoglobin: 12.7 g/dL — ABNORMAL LOW (ref 13.0–17.0)
MCH: 34 pg (ref 26.0–34.0)
MCHC: 33.8 g/dL (ref 30.0–36.0)
MCV: 100.8 fL — ABNORMAL HIGH (ref 78.0–100.0)
Platelets: 256 10*3/uL (ref 150–400)
RBC: 3.73 MIL/uL — ABNORMAL LOW (ref 4.22–5.81)
RDW: 13.4 % (ref 11.5–15.5)
WBC: 7.5 10*3/uL (ref 4.0–10.5)

## 2015-01-23 LAB — APTT: APTT: 49 s — AB (ref 24–37)

## 2015-01-23 LAB — PROTIME-INR
INR: 2.42 — ABNORMAL HIGH (ref 0.00–1.49)
Prothrombin Time: 26 seconds — ABNORMAL HIGH (ref 11.6–15.2)

## 2015-01-23 NOTE — Pre-Procedure Instructions (Signed)
FREAD KOTTKE  01/23/2015     Your procedure is scheduled on : Wednesday January 29, 2015 at 8:30 AM.  Report to [facility]mitting at 6:30 AM.  Call this number if you have problems the morning of surgery: [phone]    Remember:  Do not eat food or drink liquids after midnight.  Take these medicines the morning of surgery with A SIP OF WATER : Albuterol inhaler if needed <bring inhaler with you>, Carvedilol (Coreg), Lamotrigine (Lamictal), Rabeprazole (Aciphex), Anoro inhaler   Please follow your Physicians instructions regarding Coumadin/Warfarin   Stop taking any vitamins, herbal medications, Ibuprofen, Advil, Motrin, Aleve, Goody's, etc now   Do not wear jewelry.  Do not wear lotions, powders, or cologne.    Men may shave face and neck.  Do not bring valuables to the hospital.  [facility] is not responsible for any belongings or valuables.  Contacts, dentures or bridgework may not be worn into surgery.  Leave your suitcase in the car.  After surgery it may be brought to your room.  For patients admitted to the hospital, discharge time will be determined by your treatment team.  Patients discharged the day of surgery will not be allowed to drive home.   Name and phone number of your driver:    Special instructions:  Shower using CHG soap the night before and the morning of your surgery  Please read over the following fact sheets that you were given. Pain Booklet, Coughing and Deep Breathing and Surgical Site Infection Prevention

## 2015-01-23 NOTE — Progress Notes (Signed)
   01/23/15 1518  OBSTRUCTIVE SLEEP APNEA  Have you ever been diagnosed with sleep apnea through a sleep study? No  Do you snore loudly (loud enough to be heard through closed doors)?  0  Do you often feel tired, fatigued, or sleepy during the daytime (such as falling asleep during driving or talking to someone)? 1  Has anyone observed you stop breathing during your sleep? 0  Do you have, or are you being treated for high blood pressure? 1  BMI more than 35 kg/m2? 0  Age > 50 (1-yes) 1  Neck circumference greater than:Male 16 inches or larger, Male 17inches or larger? 1  Male Gender (Yes=1) 1  Obstructive Sleep Apnea Score 5  Score 5 or greater  Results sent to PCP   This patient has screened at risk for sleep apnea using the STOP Bang tool used during a pre-surgical visit. A score of 5 or greater is at risk for sleep apnea.

## 2015-01-23 NOTE — Progress Notes (Signed)
PCP is Maryella Shivers  Cardiologist is Mertie Moores  Patient arrived to PAT via wheelchair and on 2 liters of O2 . Sats were 91%. Patient denied having any current shortness of breath, but stated that he increases O2 to three to four liters whenever he is "doing something." Patient stated he last used Albuterol inhaler yesterday.  Patient informed Nurse that he had a stress test at [facility] within the last five years. Will request records.  Nurse inquired about Coumadin and patient stated he was instructed to stop Coumadin five days before surgery

## 2015-01-24 NOTE — Progress Notes (Signed)
Anesthesia Chart Review:  Pt is 65 year old male scheduled for video bronchoscopy with endobronchial navigation, endobronchial ultrasound on 01/29/2015 with Dr. Lamonte Sakai.   PCP is Dr. Maryella Shivers. Cardiologist is Dr. Acie Fredrickson.   PMH includes:  Atrial fibrillation, HTN, hyperlipidemia, emphysema, bipolar disorder, GERD. Uses 2L O2 via [place] at all times, increases to 3-4L prn with activity. Current smoker. BMI 31.   Medications: albuterol, carvedilol, lamictal, losartan, pravastatin, seroquel, aciphex, anoro ellipta, coumadin. Pt stopped coumadin 1/19.  Preoperative labs reviewed.  PT/PTT elevated. Will be repeated DOS.   EKG 01/23/15: Sinus rhythm with 1st degree A-V block. Inferior infarct, age undetermined. Cannot rule out Anterior infarct, age undetermined.  Echo 12/17/13:  - Left ventricle: The cavity size was normal. Wall thickness was increased in a pattern of mild LVH. Indeterminant diastolic function (atrial fibrillation). The estimated ejection fraction was 35%. Diffuse hypokinesis. - Aortic valve: There was no stenosis. There was trivial regurgitation. - Aorta: Mildly dilated aortic root. Aortic root dimension: 39 mm (ED). - Mitral valve: There was trivial regurgitation. - Left atrium: The atrium was moderately to severely dilated. - Right ventricle: The cavity size was moderately dilated. Systolic function was moderately reduced. - Right atrium: The atrium was moderately dilated. - Tricuspid valve: Peak RV-RA gradient (S): 37 mm Hg. - Pulmonary arteries: PA peak pressure: 52 mm Hg (S). - Systemic veins: IVC measured 2.8 cm with < 50% respirophasic variation, suggesting RA pressure 15 mmHg. - Impressions: The patient was in atrial fibrillation. Normal LV size with mildLV hypertrophy. EF 35% with diffuse hypokinesis. Moderately dilated RV with moderately decreased systolic function. Moderate pulmonary hypertension.  Dr. Acie Fredrickson is aware upcoming surgery.   Reviewed case with Dr.  Ermalene Postin.   If no changes, I anticipate pt can proceed with surgery as scheduled.   Willeen Cass, FNP-BC [facility]/Anesthesiology Phone: [phone] 01/24/2015 12:35 PM

## 2015-01-28 ENCOUNTER — Other Ambulatory Visit: Payer: Self-pay | Admitting: Cardiovascular Disease

## 2015-01-29 ENCOUNTER — Ambulatory Visit (HOSPITAL_COMMUNITY): Payer: BLUE CROSS/BLUE SHIELD | Admitting: Emergency Medicine

## 2015-01-29 ENCOUNTER — Ambulatory Visit (HOSPITAL_COMMUNITY)
Admission: RE | Admit: 2015-01-29 | Discharge: 2015-01-29 | Disposition: A | Discharge status: Home / Self Care | Payer: BLUE CROSS/BLUE SHIELD | Source: Ambulatory Visit | Attending: Emergency Medicine | Admitting: Emergency Medicine

## 2015-01-29 ENCOUNTER — Encounter (HOSPITAL_COMMUNITY): Payer: Self-pay | Admitting: Surgery

## 2015-01-29 ENCOUNTER — Ambulatory Visit (HOSPITAL_COMMUNITY): Payer: BLUE CROSS/BLUE SHIELD

## 2015-01-29 ENCOUNTER — Encounter (HOSPITAL_COMMUNITY)
Admission: RE | Disposition: A | Discharge status: Home / Self Care | Payer: Self-pay | Source: Ambulatory Visit | Attending: Emergency Medicine

## 2015-01-29 ENCOUNTER — Ambulatory Visit (HOSPITAL_COMMUNITY): Payer: BLUE CROSS/BLUE SHIELD | Admitting: Certified Registered"

## 2015-01-29 DIAGNOSIS — Z7901 Long term (current) use of anticoagulants: Secondary | ICD-10-CM | POA: Diagnosis not present

## 2015-01-29 DIAGNOSIS — R918 Other nonspecific abnormal finding of lung field: Secondary | ICD-10-CM | POA: Insufficient documentation

## 2015-01-29 DIAGNOSIS — R590 Localized enlarged lymph nodes: Secondary | ICD-10-CM | POA: Diagnosis not present

## 2015-01-29 DIAGNOSIS — R599 Enlarged lymph nodes, unspecified: Secondary | ICD-10-CM

## 2015-01-29 DIAGNOSIS — K219 Gastro-esophageal reflux disease without esophagitis: Secondary | ICD-10-CM | POA: Diagnosis not present

## 2015-01-29 DIAGNOSIS — E785 Hyperlipidemia, unspecified: Secondary | ICD-10-CM | POA: Insufficient documentation

## 2015-01-29 DIAGNOSIS — J439 Emphysema, unspecified: Secondary | ICD-10-CM | POA: Insufficient documentation

## 2015-01-29 DIAGNOSIS — F17210 Nicotine dependence, cigarettes, uncomplicated: Secondary | ICD-10-CM | POA: Diagnosis not present

## 2015-01-29 DIAGNOSIS — I509 Heart failure, unspecified: Secondary | ICD-10-CM | POA: Diagnosis not present

## 2015-01-29 DIAGNOSIS — I110 Hypertensive heart disease with heart failure: Secondary | ICD-10-CM | POA: Insufficient documentation

## 2015-01-29 DIAGNOSIS — Z79899 Other long term (current) drug therapy: Secondary | ICD-10-CM | POA: Diagnosis not present

## 2015-01-29 DIAGNOSIS — F319 Bipolar disorder, unspecified: Secondary | ICD-10-CM | POA: Diagnosis not present

## 2015-01-29 DIAGNOSIS — Z419 Encounter for procedure for purposes other than remedying health state, unspecified: Secondary | ICD-10-CM

## 2015-01-29 DIAGNOSIS — I4891 Unspecified atrial fibrillation: Secondary | ICD-10-CM | POA: Insufficient documentation

## 2015-01-29 DIAGNOSIS — Z9889 Other specified postprocedural states: Secondary | ICD-10-CM

## 2015-01-29 HISTORY — PX: VIDEO BRONCHOSCOPY WITH ENDOBRONCHIAL ULTRASOUND: SHX6177

## 2015-01-29 HISTORY — PX: VIDEO BRONCHOSCOPY WITH ENDOBRONCHIAL NAVIGATION: SHX6175

## 2015-01-29 LAB — PROTIME-INR
INR: 1.24 (ref 0.00–1.49)
Prothrombin Time: 15.8 seconds — ABNORMAL HIGH (ref 11.6–15.2)

## 2015-01-29 LAB — APTT: APTT: 33 s (ref 24–37)

## 2015-01-29 SURGERY — VIDEO BRONCHOSCOPY WITH ENDOBRONCHIAL NAVIGATION
Anesthesia: General

## 2015-01-29 MED ORDER — PHENYLEPHRINE 40 MCG/ML (10ML) SYRINGE FOR IV PUSH (FOR BLOOD PRESSURE SUPPORT)
PREFILLED_SYRINGE | INTRAVENOUS | Status: AC
  Filled 2015-01-29: qty 10

## 2015-01-29 MED ORDER — PROMETHAZINE HCL 25 MG/ML IJ SOLN
6.2500 mg | INTRAMUSCULAR | Status: DC | PRN
Start: 2015-01-29 — End: 2015-01-29

## 2015-01-29 MED ORDER — 0.9 % SODIUM CHLORIDE (POUR BTL) OPTIME
TOPICAL | Status: DC | PRN
  Administered 2015-01-29: 1000 mL

## 2015-01-29 MED ORDER — PROPOFOL 10 MG/ML IV BOLUS
INTRAVENOUS | Status: DC | PRN
  Administered 2015-01-29: 150 mg via INTRAVENOUS

## 2015-01-29 MED ORDER — ALBUTEROL SULFATE HFA 108 (90 BASE) MCG/ACT IN AERS
INHALATION_SPRAY | RESPIRATORY_TRACT | Status: DC | PRN
Start: 2015-01-29 — End: 2015-01-29
  Administered 2015-01-29: 4 via RESPIRATORY_TRACT

## 2015-01-29 MED ORDER — ONDANSETRON HCL 4 MG/2ML IJ SOLN
INTRAMUSCULAR | Status: AC
  Filled 2015-01-29: qty 2

## 2015-01-29 MED ORDER — LACTATED RINGERS IV SOLN
INTRAVENOUS | Status: DC | PRN
  Administered 2015-01-29 (×2): via INTRAVENOUS

## 2015-01-29 MED ORDER — MIDODRINE HCL 5 MG PO TABS
10.0000 mg | ORAL_TABLET | Freq: Once | ORAL | Status: AC
  Administered 2015-01-29: 10 mg via ORAL
  Filled 2015-01-29: qty 2

## 2015-01-29 MED ORDER — FENTANYL CITRATE (PF) 100 MCG/2ML IJ SOLN
INTRAMUSCULAR | Status: DC | PRN
  Administered 2015-01-29: 50 ug via INTRAVENOUS
  Administered 2015-01-29: 100 ug via INTRAVENOUS
  Administered 2015-01-29 (×2): 50 ug via INTRAVENOUS

## 2015-01-29 MED ORDER — LIDOCAINE HCL (CARDIAC) 20 MG/ML IV SOLN
INTRAVENOUS | Status: AC
  Filled 2015-01-29: qty 5

## 2015-01-29 MED ORDER — LIDOCAINE HCL (CARDIAC) 20 MG/ML IV SOLN
INTRAVENOUS | Status: DC | PRN
  Administered 2015-01-29: 60 mg via INTRAVENOUS

## 2015-01-29 MED ORDER — SODIUM CHLORIDE 0.9 % IJ SOLN
INTRAMUSCULAR | Status: AC
  Filled 2015-01-29: qty 10

## 2015-01-29 MED ORDER — MIDAZOLAM HCL 5 MG/5ML IJ SOLN
INTRAMUSCULAR | Status: DC | PRN
  Administered 2015-01-29: 2 mg via INTRAVENOUS

## 2015-01-29 MED ORDER — PHENYLEPHRINE HCL 10 MG/ML IJ SOLN
30.0000 ug/min | INTRAVENOUS | Status: AC
  Administered 2015-01-29: 20 ug/min via INTRAVENOUS
  Filled 2015-01-29: qty 1

## 2015-01-29 MED ORDER — PHENYLEPHRINE HCL 10 MG/ML IJ SOLN
10.0000 mg | INTRAVENOUS | Status: DC | PRN
  Administered 2015-01-29: 40 ug/min via INTRAVENOUS
  Administered 2015-01-29: 11:00:00 via INTRAVENOUS

## 2015-01-29 MED ORDER — WARFARIN SODIUM 5 MG PO TABS: 2.5000 mg | ORAL_TABLET | Freq: Every day | ORAL | Status: DC

## 2015-01-29 MED ORDER — SUGAMMADEX SODIUM 200 MG/2ML IV SOLN
INTRAVENOUS | Status: AC
  Filled 2015-01-29: qty 2

## 2015-01-29 MED ORDER — EPHEDRINE SULFATE 50 MG/ML IJ SOLN
INTRAMUSCULAR | Status: DC | PRN
  Administered 2015-01-29 (×3): 10 mg via INTRAVENOUS
  Administered 2015-01-29 (×2): 5 mg via INTRAVENOUS
  Administered 2015-01-29: 10 mg via INTRAVENOUS

## 2015-01-29 MED ORDER — ROCURONIUM BROMIDE 50 MG/5ML IV SOLN
INTRAVENOUS | Status: AC
  Filled 2015-01-29: qty 1

## 2015-01-29 MED ORDER — ALBUTEROL SULFATE HFA 108 (90 BASE) MCG/ACT IN AERS
INHALATION_SPRAY | RESPIRATORY_TRACT | Status: AC
  Filled 2015-01-29: qty 6.7

## 2015-01-29 MED ORDER — FENTANYL CITRATE (PF) 100 MCG/2ML IJ SOLN: 25.0000 ug | INTRAMUSCULAR | Status: DC | PRN

## 2015-01-29 MED ORDER — PHENYLEPHRINE HCL 10 MG/ML IJ SOLN
INTRAMUSCULAR | Status: DC | PRN
  Administered 2015-01-29: 160 ug via INTRAVENOUS
  Administered 2015-01-29: 120 ug via INTRAVENOUS

## 2015-01-29 MED ORDER — FENTANYL CITRATE (PF) 250 MCG/5ML IJ SOLN
INTRAMUSCULAR | Status: AC
  Filled 2015-01-29: qty 5

## 2015-01-29 MED ORDER — ROCURONIUM BROMIDE 100 MG/10ML IV SOLN
INTRAVENOUS | Status: DC | PRN
  Administered 2015-01-29: 10 mg via INTRAVENOUS
  Administered 2015-01-29: 50 mg via INTRAVENOUS
  Administered 2015-01-29: 10 mg via INTRAVENOUS
  Administered 2015-01-29: 20 mg via INTRAVENOUS
  Administered 2015-01-29: 10 mg via INTRAVENOUS

## 2015-01-29 MED ORDER — EPHEDRINE SULFATE 50 MG/ML IJ SOLN
INTRAMUSCULAR | Status: AC
  Filled 2015-01-29: qty 1

## 2015-01-29 MED ORDER — PROPOFOL 10 MG/ML IV BOLUS
INTRAVENOUS | Status: AC
  Filled 2015-01-29: qty 20

## 2015-01-29 MED ORDER — SUGAMMADEX SODIUM 200 MG/2ML IV SOLN
INTRAVENOUS | Status: DC | PRN
  Administered 2015-01-29: 400 mg via INTRAVENOUS

## 2015-01-29 MED ORDER — DEXTROSE 5 % IV SOLN
0.0000 ug/min | INTRAVENOUS | Status: DC
  Filled 2015-01-29: qty 1

## 2015-01-29 MED ORDER — ONDANSETRON HCL 4 MG/2ML IJ SOLN
INTRAMUSCULAR | Status: DC | PRN
  Administered 2015-01-29: 4 mg via INTRAVENOUS

## 2015-01-29 MED ORDER — MIDAZOLAM HCL 2 MG/2ML IJ SOLN
INTRAMUSCULAR | Status: AC
  Filled 2015-01-29: qty 2

## 2015-01-29 SURGICAL SUPPLY — 41 items
ADAPTER BRONCH F/PENTAX (ADAPTER) ×3 IMPLANT
BRUSH CYTOL CELLEBRITY 1.5X140 (MISCELLANEOUS) ×3 IMPLANT
BRUSH SUPERTRAX BIOPSY (INSTRUMENTS) IMPLANT
BRUSH SUPERTRAX NDL-TIP CYTO (INSTRUMENTS) ×3 IMPLANT
CANISTER SUCTION 2500CC (MISCELLANEOUS) ×3 IMPLANT
CHANNEL WORK EXTEND EDGE 180 (KITS) IMPLANT
CHANNEL WORK EXTEND EDGE 45 (KITS) IMPLANT
CHANNEL WORK EXTEND EDGE 90 (KITS) IMPLANT
CONT SPEC 4OZ CLIKSEAL STRL BL (MISCELLANEOUS) ×18 IMPLANT
COVER DOME SNAP 22 D (MISCELLANEOUS) ×3 IMPLANT
COVER TABLE BACK 60X90 (DRAPES) ×3 IMPLANT
FILTER STRAW FLUID ASPIR (MISCELLANEOUS) IMPLANT
FORCEPS BIOP RJ4 1.8 (CUTTING FORCEPS) IMPLANT
FORCEPS BIOP SUPERTRX PREMAR (INSTRUMENTS) IMPLANT
GAUZE SPONGE 4X4 12PLY STRL (GAUZE/BANDAGES/DRESSINGS) ×3 IMPLANT
GLOVE BIO SURGEON STRL SZ7.5 (GLOVE) ×6 IMPLANT
GOWN STRL REUS W/ TWL LRG LVL3 (GOWN DISPOSABLE) ×1 IMPLANT
GOWN STRL REUS W/TWL LRG LVL3 (GOWN DISPOSABLE) ×2
KIT CLEAN ENDO COMPLIANCE (KITS) ×6 IMPLANT
KIT LOCATABLE GUIDE (CANNULA) IMPLANT
KIT MARKER FIDUCIAL DELIVERY (KITS) IMPLANT
KIT PROCEDURE EDGE 180 (KITS) ×3 IMPLANT
KIT PROCEDURE EDGE 45 (KITS) IMPLANT
KIT PROCEDURE EDGE 90 (KITS) IMPLANT
KIT ROOM TURNOVER OR (KITS) ×3 IMPLANT
MARKER SKIN DUAL TIP RULER LAB (MISCELLANEOUS) ×3 IMPLANT
NEEDLE BIOPSY TRANSBRONCH 21G (NEEDLE) ×3 IMPLANT
NEEDLE SONO TIP II EBUS (NEEDLE) ×6 IMPLANT
NEEDLE SUPERTRX PREMARK BIOPSY (NEEDLE) ×3 IMPLANT
NS IRRIG 1000ML POUR BTL (IV SOLUTION) ×3 IMPLANT
OIL SILICONE PENTAX (PARTS (SERVICE/REPAIRS)) ×3 IMPLANT
PAD ARMBOARD 7.5X6 YLW CONV (MISCELLANEOUS) ×6 IMPLANT
PATCHES PATIENT (LABEL) ×3 IMPLANT
SYR 20CC LL (SYRINGE) ×3 IMPLANT
SYR 20ML ECCENTRIC (SYRINGE) ×6 IMPLANT
SYR 50ML SLIP (SYRINGE) ×3 IMPLANT
SYR 5ML LUER SLIP (SYRINGE) ×3 IMPLANT
TOWEL OR 17X24 6PK STRL BLUE (TOWEL DISPOSABLE) ×3 IMPLANT
TRAP SPECIMEN MUCOUS 40CC (MISCELLANEOUS) IMPLANT
TUBE CONNECTING 20'X1/4 (TUBING) ×2
TUBE CONNECTING 20X1/4 (TUBING) ×4 IMPLANT

## 2015-01-29 NOTE — Discharge Instructions (Signed)
Flexible Bronchoscopy, Care After These instructions give you information on caring for yourself after your procedure. Your doctor may also give you more specific instructions. Call your doctor if you have any problems or questions after your procedure. HOME CARE  Do not eat or drink anything for 2 hours after your procedure. If you try to eat or drink before the medicine wears off, food or drink could go into your lungs. You could also burn yourself.  After 2 hours have passed and when you can cough and gag normally, you may eat soft food and drink liquids slowly.  The day after the test, you may eat your normal diet.  You may do your normal activities.  Keep all doctor visits. GET HELP RIGHT AWAY IF:  You get more and more short of breath.  You get light-headed.  You feel like you are going to pass out (faint).  You have chest pain.  You have new problems that worry you.  You cough up more than a little blood.  You cough up more blood than before. MAKE SURE YOU:  Understand these instructions.  Will watch your condition.  Will get help right away if you are not doing well or get worse.   This information is not intended to replace advice given to you by your health care provider. Make sure you discuss any questions you have with your health care provider.   Document Released: 10/18/2008 Document Revised: 12/26/2012 Document Reviewed: 08/25/2012 Elsevier Interactive Patient Education 2016 [place].  Please restart your Coumadin using normal dosing pattern on Thursday 01/30/15  Please call our office for any questions or concerns. [phone].

## 2015-01-29 NOTE — Op Note (Signed)
Video Bronchoscopy with Endobronchial Ultrasound and Electromagnetic Navigation Procedure Note  Date of Operation: 01/29/2015  Pre-op Diagnosis: LUL nodule, RLL nodule and mediastinal lymphadenopathy  Post-op Diagnosis: same  Surgeon: Baltazar Apo  Assistants: none  Anesthesia: General endotracheal anesthesia  Operation: Flexible video fiberoptic bronchoscopy with endobronchial ultrasound and biopsies.  Estimated Blood Loss: Minimal  Complications: none apparent  Indications and History: Gerald Ho is a 65 y.o. male with history of tobacco use. He has a left upper lobe nodule, a right lower lobe nodule both of which are enlarging on serial CT scans of the chest. PET scan shows hypermetabolism in both locations as well as in some slightly enlarged mediastinal lymph nodes. Recommendation was made to achieve tissue diagnosis with navigational bronchoscopy and endobronchial ultrasound to sample his mediastinal nodes.  The risks, benefits, complications, treatment options and expected outcomes were discussed with the patient.  The possibilities of pneumothorax, pneumonia, reaction to medication, pulmonary aspiration, perforation of a viscus, bleeding, failure to diagnose a condition and creating a complication requiring transfusion or operation were discussed with the patient who freely signed the consent.    Description of Procedure: The patient was examined in the preoperative area and history and data from the preprocedure consultation were reviewed. It was deemed appropriate to proceed.  The patient was taken to OR10, identified as Gerald Ho and the procedure verified as Flexible Video Fiberoptic Bronchoscopy.  A Time Out was held and the above information confirmed. After being taken to the operating room general anesthesia was initiated and the patient  was orally intubated. The video fiberoptic bronchoscope was introduced via the endotracheal tube and a general inspection was performed  which showed normal airways throughout. Some clear mucus was suctioned. There was no evidence of endobronchial lesion. The standard scope was then withdrawn and the endobronchial ultrasound was used to identify and characterize the peritracheal, hilar and bronchial lymph nodes. Inspection showed enlargement of 4R, 4L, and Station 7 nodes. Using real-time ultrasound guidance Wang needle biopsies were take from Station 4L, 4R, 7 nodes and were sent for cytology.   Attention was then turned to the abnormal nodules. Prior to the date of the procedure a high-resolution CT scan of the chest was performed. Utilizing [place] a virtual tracheobronchial tree was generated to allow the creation of distinct navigation pathways to the patient's parenchymal abnormalities. The video fiberoptic bronchoscope was re-ntroduced via the endotracheal tube.The extendable working channel and locator guide were introduced into the bronchoscope. The distinct navigation pathways prepared prior to this procedure were then utilized to navigate to within 0.5-1.7 cm of the patient's left upper lobe nodule and right lower lobe nodule identified on CT scan. The extendable working channel was secured into place and the locator guide was withdrawn. Under fluoroscopic guidance transbronchial needle brushings, transbronchial Wang needle biopsies, and transbronchial forceps biopsies were performed at both locations to be sent for cytology and pathology. A bronchioalveolar lavage was performed in the left upper lobe and the right upper lobe and sent for cytology and microbiology (bacterial, fungal, AFB smears and cultures). At the end of the procedure a general airway inspection was performed and there was no evidence of active bleeding. The bronchoscope was removed.  The patient tolerated the procedure well. There was no significant blood loss and there were no obvious complications. A post-procedural chest x-ray is pending. Anesthesia  was reversed and the patient was taken to the PACU for recovery.   Samples: 1. Wang needle biopsies from 4L  node 2. Wang needle biopsies from 4R node 3. Wang needle biopsies from 7 node 4. Transbronchial needle brushings from left upper lobe nodule 5. Transbronchial Wang needle biopsies from left upper lobe nodule 6. Transbronchial forceps biopsies from left upper lobe nodule 7. Bronchoalveolar lavage from left upper lobe 8. Transbronchial needle brushings from right lower lobe nodule 9. Transbronchial Wang needle biopsies from right lower lobe nodule 10. Transbronchial forceps biopsies from right lower lobe nodule 11. Bronchoalveolar lavage from right lower lobe  Plans:  The patient will be discharged from the PACU to home when recovered from anesthesia and after chest x-ray is reviewed. We will review the cytology, pathology and microbiology results with the patient when they become available. Outpatient followup will be with Dr Lamonte Sakai.    Baltazar Apo, MD, PhD 01/29/2015, 11:10 AM [facility] [phone] or if no answer [phone]

## 2015-01-29 NOTE — Anesthesia Procedure Notes (Signed)
Procedure Name: Intubation Date/Time: 01/29/2015 8:37 AM Performed by: Lance Coon Pre-anesthesia Checklist: Patient identified, Timeout performed, Emergency Drugs available, Suction available and Patient being monitored Patient Re-evaluated:Patient Re-evaluated prior to inductionOxygen Delivery Method: Circle system utilized Preoxygenation: Pre-oxygenation with 100% oxygen Intubation Type: IV induction Ventilation: Mask ventilation without difficulty Laryngoscope Size: Miller and 2 Grade View: Grade II Tube type: Oral Number of attempts: 1 Airway Equipment and Method: Stylet Placement Confirmation: ETT inserted through vocal cords under direct vision,  breath sounds checked- equal and bilateral and positive ETCO2 Secured at: 23 cm Tube secured with: Tape Dental Injury: Teeth and Oropharynx as per pre-operative assessment

## 2015-01-29 NOTE — Transfer of Care (Signed)
Immediate Anesthesia Transfer of Care Note  Patient: Gerald Ho  Procedure(s) Performed: Procedure(s): VIDEO BRONCHOSCOPY WITH ENDOBRONCHIAL NAVIGATION (N/A) VIDEO BRONCHOSCOPY WITH ENDOBRONCHIAL ULTRASOUND (N/A)  Patient Location: PACU  Anesthesia Type:General  Level of Consciousness: awake, alert , oriented and patient cooperative  Airway & Oxygen Therapy: Patient Spontanous Breathing and Patient connected to face mask oxygen  Post-op Assessment: Report given to RN, Post -op Vital signs reviewed and stable and Patient moving all extremities X 4  Post vital signs: Reviewed and stable  Last Vitals:  Filed Vitals:   01/29/15 0701  BP: 149/72  Pulse: 72  Temp: 36.3 C  Resp: 18    Complications: No apparent anesthesia complications

## 2015-01-29 NOTE — H&P (Signed)
Gerald Ho is an 65 y.o. male.    HPI: 65 year old man with a history of tobacco use (80 pack years), COPD, hypertension, bipolar disorder, GERD. We have been following a small spiculated left upper lobe nodule and a larger more peripheral right lower lobe area of groundglass by serial CT scans in the low-dose  CT scan lung cancer screening program. Both areas had some increased in size on most recent scan from December 2016. A PET scan performed on 12/26/14 showed that both lesions were hypermetabolic. Also noted were some small mediastinal lymph nodes in the right paratracheal area and AP window that had some hypermetabolism. He presents today for biopsies via bronchoscopy with navigation and endobronchial ultrasound. He denies any new medications, any new symptoms. His breathing has been stable. He is currently managed on Anoro.   Past Medical History  Diagnosis Date  . Current smoker     2 packs per day  . High blood pressure   . Emphysema   . Dysrhythmia     Atrial Fibrillation  . Shortness of breath dyspnea   . Pneumonia     hx of  . Bipolar disorder ([place])     Takes Lamictal & Seroquel  . GERD (gastroesophageal reflux disease)   . Hyperlipemia     Past Surgical History  Procedure Laterality Date  . Lumbar disc surgery      X 2  . Cardioversion N/A 02/26/2014    Procedure: CARDIOVERSION;  Surgeon: Thayer Headings, MD;  Location: [facility] ENDOSCOPY;  Service: Cardiovascular;  Laterality: N/A;  . Colonoscopy w/ polypectomy    . Upper gi endoscopy    . Thumb fusion Right     Family History  Problem Relation Age of Onset  . Emphysema Father   . Heart attack Father   . AAA (abdominal aortic aneurysm) Mother    Social History:  reports that he has been smoking Cigarettes.  He has a 10 pack-year smoking history. He does not have any smokeless tobacco history on file. He reports that he drinks alcohol. His drug history is not on file.  Allergies: No Known Allergies  Medications  Prior to Admission  Medication Sig Dispense Refill  . albuterol (PROVENTIL HFA;VENTOLIN HFA) 108 (90 BASE) MCG/ACT inhaler Inhale 2 puffs into the lungs every 6 (six) hours as needed for wheezing or shortness of breath. 1 Inhaler 6  . carvedilol (COREG) 12.5 MG tablet Take 1 tablet (12.5 mg total) by mouth 2 (two) times daily. 62 tablet 11  . lamoTRIgine (LAMICTAL) 200 MG tablet Take 200 mg by mouth daily at 12 noon.     Marland Kitchen losartan (COZAAR) 50 MG tablet Take 50 mg by mouth daily at 12 noon.     . pravastatin (PRAVACHOL) 20 MG tablet Take 20 mg by mouth daily at 12 noon.     . QUEtiapine (SEROQUEL) 100 MG tablet Take 100 mg by mouth at bedtime.     . RABEprazole (ACIPHEX) 20 MG tablet Take 20 mg by mouth daily at 12 noon.     Marland Kitchen Umeclidinium-Vilanterol (ANORO ELLIPTA) 62.5-25 MCG/INH AEPB Inhale 1 puff into the lungs daily. 60 each 5  . warfarin (COUMADIN) 5 MG tablet Take as directed by Coumadin clinic (Patient taking differently: Take 2.5-5 mg by mouth daily at 12 noon. 2.'5mg'$  on Monday, Wednesday and Friday.  '5mg'$  all other days.) 120 tablet 1    Results for orders placed or performed during the hospital encounter of 01/29/15 (from the past 48  hour(s))  APTT     Status: None   Collection Time: 01/29/15  7:22 AM  Result Value Ref Range   aPTT 33 24 - 37 seconds  Protime-INR     Status: Abnormal   Collection Time: 01/29/15  7:22 AM  Result Value Ref Range   Prothrombin Time 15.8 (H) 11.6 - 15.2 seconds   INR 1.24 0.00 - 1.49   No results found.  ROS As per HPI   Blood pressure 149/72, pulse 72, temperature 97.3 F (36.3 C), temperature source Oral, resp. rate 18, height 6' (1.829 m), weight 104.327 kg (230 lb), SpO2 92 %. Physical Exam  Gen: Pleasant, well-nourished, in no distress,  normal affect  ENT: No lesions,  mouth clear,  oropharynx clear, no postnasal drip  Neck: No JVD, no TMG, no carotid bruits  Lungs: No use of accessory muscles, clear without rales or  rhonchi  Cardiovascular: RRR, heart sounds normal, no murmur or gallops, trace LE peripheral edema  Musculoskeletal: No deformities, no cyanosis or clubbing  Neuro: alert, non focal  Skin: Warm, no lesions or rashes   Assessment/Plan 10 mm left upper lobe pulmonary nodule and 2 x 3 cm pleural based right lower lobe superior segmental opacity both hypermetabolic on PET scan. We will plan to perform navigational bronchoscopy to biopsy both areas as well as endobronchial ultrasound to sample any available mediastinal lymph nodes. He understands the procedure, the risks, the benefits. He elects to proceed. No barriers identified.  Baltazar Apo, MD, PhD 01/29/2015, 8:33 AM [facility] [phone] or if no answer [phone]

## 2015-01-29 NOTE — Anesthesia Preprocedure Evaluation (Signed)
Anesthesia Evaluation  Patient identified by MRN, date of birth, ID band Patient awake    Reviewed: Allergy & Precautions, NPO status , Patient's Chart, lab work & pertinent test results  Airway Mallampati: II  TM Distance: >3 FB Neck ROM: Full    Dental no notable dental hx.    Pulmonary COPD, Current Smoker,    Pulmonary exam normal breath sounds clear to auscultation       Cardiovascular hypertension, Pt. on medications +CHF  Normal cardiovascular exam Rhythm:Regular Rate:Normal     Neuro/Psych negative neurological ROS  negative psych ROS   GI/Hepatic negative GI ROS, (+)     substance abuse  alcohol use,   Endo/Other  negative endocrine ROS  Renal/GU negative Renal ROS  negative genitourinary   Musculoskeletal negative musculoskeletal ROS (+)   Abdominal   Peds negative pediatric ROS (+)  Hematology negative hematology ROS (+)   Anesthesia Other Findings   Reproductive/Obstetrics negative OB ROS                             Anesthesia Physical Anesthesia Plan  ASA: III  Anesthesia Plan: General   Post-op Pain Management:    Induction: Intravenous  Airway Management Planned: Oral ETT  Additional Equipment:   Intra-op Plan:   Post-operative Plan: Extubation in OR  Informed Consent: I have reviewed the patients History and Physical, chart, labs and discussed the procedure including the risks, benefits and alternatives for the proposed anesthesia with the patient or authorized representative who has indicated his/her understanding and acceptance.   Dental advisory given  Plan Discussed with: CRNA and Surgeon  Anesthesia Plan Comments:         Anesthesia Quick Evaluation

## 2015-01-29 NOTE — Anesthesia Postprocedure Evaluation (Signed)
Anesthesia Post Note  Patient: Gerald Ho  Procedure(s) Performed: Procedure(s) (LRB): VIDEO BRONCHOSCOPY WITH ENDOBRONCHIAL NAVIGATION (N/A) VIDEO BRONCHOSCOPY WITH ENDOBRONCHIAL ULTRASOUND (N/A)  Patient location during evaluation: PACU Anesthesia Type: General Level of consciousness: awake and alert Pain management: pain level controlled Vital Signs Assessment: post-procedure vital signs reviewed and stable Respiratory status: spontaneous breathing, nonlabored ventilation, respiratory function stable and patient connected to nasal cannula oxygen Cardiovascular status: blood pressure returned to baseline and stable Postop Assessment: no signs of nausea or vomiting Anesthetic complications: no Comments: Patient took Losartan this am. Remains mildly hypotensive in PACU. Will continue neo gtt as needed prior to release.    Last Vitals:  Filed Vitals:   01/29/15 1115 01/29/15 1130  BP: 86/58 77/57  Pulse: 78 70  Temp: 35.6 C   Resp: 24 18    Last Pain:  Filed Vitals:   01/29/15 1139  PainSc: 0-No pain                 Deadra Diggins S

## 2015-01-30 ENCOUNTER — Encounter (HOSPITAL_COMMUNITY): Payer: Self-pay | Admitting: Emergency Medicine

## 2015-01-31 ENCOUNTER — Ambulatory Visit (INDEPENDENT_AMBULATORY_CARE_PROVIDER_SITE_OTHER): Payer: BLUE CROSS/BLUE SHIELD | Admitting: Emergency Medicine

## 2015-01-31 ENCOUNTER — Ambulatory Visit (INDEPENDENT_AMBULATORY_CARE_PROVIDER_SITE_OTHER)
Admission: RE | Admit: 2015-01-31 | Discharge: 2015-01-31 | Disposition: A | Discharge status: Home / Self Care | Payer: BLUE CROSS/BLUE SHIELD | Source: Ambulatory Visit | Attending: Emergency Medicine | Admitting: Emergency Medicine

## 2015-01-31 ENCOUNTER — Encounter: Payer: Self-pay | Admitting: Emergency Medicine

## 2015-01-31 VITALS — BP 110/70 | HR 85 | Ht 72.0 in | Wt 230.0 lb

## 2015-01-31 DIAGNOSIS — J449 Chronic obstructive pulmonary disease, unspecified: Secondary | ICD-10-CM | POA: Diagnosis not present

## 2015-01-31 DIAGNOSIS — R079 Chest pain, unspecified: Secondary | ICD-10-CM

## 2015-01-31 DIAGNOSIS — R918 Other nonspecific abnormal finding of lung field: Secondary | ICD-10-CM

## 2015-01-31 LAB — CULTURE, RESPIRATORY W GRAM STAIN: Culture: NORMAL

## 2015-01-31 LAB — CULTURE, RESPIRATORY: CULTURE: NORMAL

## 2015-01-31 NOTE — Progress Notes (Signed)
HPI:  65 yo smoker (86 pk-yrs), hx HTN, CHF, COPD dx in 2000. Prior admission for beer potomania and hyponatremia. Underwent screening LDCT on 10/06/12 for high risk pt. He has exertional dyspnea. He is using symbicort and spiriva depending on when he can get sample and how much work he is doing. His LDCT shows a 1.3cm LUL spiculated nodule. Here to discuss the abnormal CT.   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 No known hx TB exposure.   ROV 01/12/13 -- follows up for dyspnea/COPD, LUL nodule. PET performed 1/8 shows the LUL nodule is unchanged in size and is not hypermetabolic on PET. He was desaturated on walking into the office today. Still has cough prod of white. Worst when he lays down flat. He is on spiriva and symbicort > takes them when he able to afford; sounds like he is using symbicort more regularly than spiriva but neither of them reliably. He is hypertensive today. Medication compliance and cost is a huge issue here. He used to have oxygen but couldn't afford copay.   ROV 09/04/13 -- follows for her COPD and a left upper lobe nodule, negative on PET. Needs repeat CT scan 10/'15.  He is having trouble with a L parotid swelling and pain. He was treated with abx with some improvement. He is outside some, gardening. He is on spiriva, sometimes skips it. He also decreases his symbicort to qd. He is smoking 1pk/day. Not interested in quitting.   ROV 12/05/13 -- follows for his COPD, Also with a LUL nodule that we have followed on LDCT. Most recent scan 12/03/13 showed the LUL nodule was smaller, there is stable to slightly larger nodule in the RLL. He is having more exertional SOB. Last time we changed spiriva and symbicort to Anoro. He is desaturated on RA today.   ROV 03/22/14 -- follow up visit for tobacco, pulmonary nodules  (on LDCT), tobacco abuse. Presents today reporting that he has been doing well. Still smoking only 3 cig a day.  No flares, but he does state that he gets SOB with  significant exertion like yard work. He has a portable concentrator that is on 3L/min w exertion. He remains on Anoro but has not been taking every day.   ROV 08/02/14 -- follow-up visit for tobacco use, COPD. He has old inflammatory change and pulmonary nodules that we are following through the low-dose CT scan screening program. His most recent scan was performed on 06/06/14 and I reviewed this scan personally. It shows overall stability of the pulmonary nodules in question with exception of some increased GGI .   He is on 2-3L/min O2 via POC. He is scheduled for repeat scan end of the year. Smoking 3 cig a day.  Uses vapor and chewing tobacco to supplement. Remains on Anoro. Minimal cough. Able to exert some by limited by SOB. Recovers quickly w rest.   ROV1/27/17 -- Mr. Hintz  has a history of tobacco use and COPD as well as slowly enlarging pulmonary nodules on CT scan of the chest. He underwent navigational bronchoscopy on 01/29/15 2 biopsy a small speculated left upper lobe nodule as well as a larger more diffuse right lower lobe nodule both of which were hypermetabolic on PET scan that was performed on 12/26/14. I reviewed his cytology results with the pathologist today, and special stains are still being performed. I expect that there will be a final report out next Monday.  He coughed up an old clot  last night, hasn't seen any other blood. He has had some R low back pain since his procedure. He is having more exertional SOB over the last several months. He has gained 35 lbs in 18 months. He is on Anoro.     Filed Vitals:   01/31/15 1601 01/31/15 1602  BP:  110/70  Pulse:  85  Height: 6' (1.829 m)   Weight: 230 lb (104.327 kg)   SpO2:  90%   Gen: Pleasant, well-nourished, in no distress,  normal affect  ENT: No lesions,  mouth clear,  oropharynx clear, no postnasal drip  Neck: No JVD, no TMG, no carotid bruits  Lungs: No use of accessory muscles, distant, clear without rales or  rhonchi  Cardiovascular: RRR, heart sounds normal, no murmur or gallops, no peripheral edema  Musculoskeletal: No deformities, no cyanosis or clubbing  Neuro: alert, non focal  Skin: Warm, no lesions or rashes  06/05/24 -- LDCT COMPARISON: Low dose lung cancer screening CT 12/03/2013.  FINDINGS: Mediastinum/Lymph Nodes: Heart size is normal. There is no significant pericardial fluid, thickening or pericardial calcification. There is atherosclerosis of the thoracic aorta, the great vessels of the mediastinum and the coronary arteries, including calcified atherosclerotic plaque in the left main, left anterior descending, left circumflex and right coronary arteries. Borderline to mildly enlarged mediastinal lymph nodes measuring up to 13 mm in short axis in the low right paratracheal nodal station, slightly decreased compared to the prior examination. Esophagus is unremarkable in appearance. No axillary lymphadenopathy.  Lungs/Pleura: Again noted are multiple lesions throughout the lungs bilaterally, some of which are small solid nodules while others are ground-glass attenuation or mixed solid and subsolid nodules. The majority of these lesions are stable in size and appearance compared to the prior examinations. The exception to this is a mixed solid and subsolid lesion in the superior segment of the left lower lobe (image 130 of series 5), which has a peripheral ground-glass attenuation component measuring 12.9 mm in mean diameter, with a central solid component measuring 9.9 mm in diameter (volumetrically derived), which is slightly larger than prior examination 12/03/2013 (at which point this nodule had actually decreased in size compared to the more remote prior examination from 10/05/2012). Overall, this is still favored to represent an area of post infectious or inflammatory scarring, particularly given the lack of hypermetabolism on prior PET-CT 01/11/2013. Additionally,  in the posterior aspect of the superior segment of the right lower lobe (image 212 of series 5) there is a very ill-defined lesion that is predominantly ground-glass attenuation, which is difficult to accurately measure using standard or volumetric techniques, but appears grossly similar to the most recent prior examination from 12/03/2013, and is also favored to be post infectious or inflammatory in etiology. Mild diffuse bronchial wall thickening. Moderate centrilobular and mild paraseptal emphysema.  Upper Abdomen: Extensive atherosclerosis. Otherwise, unremarkable.  Musculoskeletal/Soft Tissues: Old compression fractures at T6 and L1, most severe at L1 where there is 30% loss of anterior vertebral body height, similar to prior examinations. There are no aggressive appearing lytic or blastic lesions noted in the visualized portions of the skeleton.  IMPRESSION: 1. Lung-Rads category 3S, probably benign findings. Overall, today's study is very complicated, as detailed above, however, the lesions in the posterior left upper lobe and superior segment of the right lower lobe are both favored to be post infectious or inflammatory areas of scarring based on their behavior over the preceding examinations dating back to 10/05/2012. Short-term follow-up in 6 months is  recommended with repeat low-dose chest CT without contrast (please use the following order, "CT CHEST LUNG CA SCREEN LOW DOSE W/O CM"). This was discussed by phone on 06/06/2014 with Dr. Lamonte Sakai by Dr. Weber Cooks at 5:15 p.m. 2. The "S" modifier above refers to potentially clinically significant non lung cancer related findings. Specifically, there is extensive atherosclerosis, including left main and 3 vessel coronary artery disease. Please note that although the presence of coronary artery calcium documents the presence of coronary artery disease, the severity of this disease and any potential stenosis cannot be assessed  on this non-gated CT examination. Assessment for potential risk factor modification, dietary therapy or pharmacologic therapy may be warranted, if clinically indicated. 3. Mild diffuse bronchial thickening with moderate centrilobular and mild paraseptal emphysema; imaging findings suggestive of underlying COPD. Counseling for smoking cessation is strongly recommended.   Pulmonary nodules Status post navigational bronchoscopy and biopsies. 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 The left upper lobe spiculated nodule has atypical cells but was nondiagnostic. The right lower lobe nodule is still being evaluated in pathology. I'll call him next week to discuss the results and to plan our next steps. He will not be a surgical candidate based on his pulmonary function so we will likely need to refer him for either chemotherapy or radiation therapy.  COPD (chronic obstructive pulmonary disease) Slow worsening in overall function. Continue Anoro. Albuterol prn.   Chest pain Atypical in nature but given his recent procedure I believe he needs a chest x-ray now  Addendum: I reviewed his chest x-ray and it shows no evidence of pneumothorax

## 2015-01-31 NOTE — Patient Instructions (Signed)
CXR today Please continue your Anoro daily Wear your oxygen at 2-4L/min at all times.  Dr Lamonte Sakai will call you next week to review your lung biopsy results  Follow with Dr Lamonte Sakai in 2 months or sooner if you have any problems.

## 2015-01-31 NOTE — Assessment & Plan Note (Signed)
Status post navigational bronchoscopy and biopsies. 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 The left upper lobe spiculated nodule has atypical cells but was nondiagnostic. The right lower lobe nodule is still being evaluated in pathology. I'll call him next week to discuss the results and to plan our next steps. He will not be a surgical candidate based on his pulmonary function so we will likely need to refer him for either chemotherapy or radiation therapy.

## 2015-01-31 NOTE — Assessment & Plan Note (Addendum)
Atypical in nature but given his recent procedure I believe he needs a chest x-ray now  Addendum: I reviewed his chest x-ray and it shows no evidence of pneumothorax

## 2015-01-31 NOTE — Assessment & Plan Note (Signed)
Slow worsening in overall function. Continue Anoro. Albuterol prn.

## 2015-02-05 ENCOUNTER — Telehealth: Payer: Self-pay | Admitting: Internal Medicine

## 2015-02-05 NOTE — Telephone Encounter (Signed)
Reviewed path results with Mr Bonebrake - unable to definitively call malignancy on the RLL biopsies. My suspicion remains high. I will discuss his case in Thoracic Conference 2/2, make decisions regarding repeat bx versus following with serial Ct scans

## 2015-02-06 ENCOUNTER — Telehealth: Payer: Self-pay | Admitting: Acute Care

## 2015-02-06 DIAGNOSIS — R918 Other nonspecific abnormal finding of lung field: Secondary | ICD-10-CM

## 2015-02-06 NOTE — Telephone Encounter (Addendum)
Order entered for CT Biopsy of right lower lobe nodule Nothing further needed.

## 2015-02-06 NOTE — Telephone Encounter (Signed)
I have called Mr. Gunderman and explained to him that his case was discussed this morning in the interdisciplinary thoracic conference. Recommendation from the group was that the patient be scheduled for a needle biopsy of the right lower lobe nodule by interventional radiology. I told Mr. Douthit that we will place the order and that he will be called by the interventional radiology team to schedule the procedure. Once results of that biopsy have been evaluated and we have a definitive diagnosis Dr. Lamonte Sakai will be in touch with Mr. Corter to explain future plan of care. Mr. Francisco verbalized understanding of the above and had no further questions upon ending the call. He has my contact information in the event he has questions in the future.  Please schedule patient for a needle biopsy of the Right Lower Lobe nodule with Interventional Radiology as soon as possible. Please place order under Dr. Lamonte Sakai so that he gets the results. Thanks so much.

## 2015-02-06 NOTE — Addendum Note (Signed)
Addended by: Mathis Dad on: 02/06/2015 04:46 PM   Modules accepted: Orders

## 2015-02-07 ENCOUNTER — Telehealth (HOSPITAL_COMMUNITY): Payer: Self-pay

## 2015-02-07 NOTE — Addendum Note (Signed)
Addended by: Mathis Dad on: 02/07/2015 11:33 AM   Modules accepted: Orders

## 2015-02-12 ENCOUNTER — Other Ambulatory Visit: Payer: Self-pay | Admitting: Radiology

## 2015-02-12 ENCOUNTER — Other Ambulatory Visit: Payer: Self-pay | Admitting: General Surgery

## 2015-02-13 ENCOUNTER — Ambulatory Visit (HOSPITAL_COMMUNITY)
Admission: RE | Admit: 2015-02-13 | Discharge: 2015-02-13 | Disposition: A | Discharge status: Home / Self Care | Payer: BLUE CROSS/BLUE SHIELD | Source: Ambulatory Visit | Attending: Diagnostic Radiology | Admitting: Diagnostic Radiology

## 2015-02-13 ENCOUNTER — Encounter (HOSPITAL_COMMUNITY): Payer: Self-pay

## 2015-02-13 ENCOUNTER — Ambulatory Visit (HOSPITAL_COMMUNITY)
Admission: RE | Admit: 2015-02-13 | Discharge: 2015-02-13 | Disposition: A | Discharge status: Home / Self Care | Payer: BLUE CROSS/BLUE SHIELD | Source: Ambulatory Visit | Attending: Emergency Medicine | Admitting: Emergency Medicine

## 2015-02-13 DIAGNOSIS — J9811 Atelectasis: Secondary | ICD-10-CM | POA: Insufficient documentation

## 2015-02-13 DIAGNOSIS — R918 Other nonspecific abnormal finding of lung field: Secondary | ICD-10-CM | POA: Insufficient documentation

## 2015-02-13 DIAGNOSIS — Z9981 Dependence on supplemental oxygen: Secondary | ICD-10-CM | POA: Diagnosis not present

## 2015-02-13 DIAGNOSIS — F17200 Nicotine dependence, unspecified, uncomplicated: Secondary | ICD-10-CM | POA: Insufficient documentation

## 2015-02-13 DIAGNOSIS — R911 Solitary pulmonary nodule: Secondary | ICD-10-CM | POA: Diagnosis not present

## 2015-02-13 DIAGNOSIS — Z9889 Other specified postprocedural states: Secondary | ICD-10-CM | POA: Insufficient documentation

## 2015-02-13 DIAGNOSIS — C3431 Malignant neoplasm of lower lobe, right bronchus or lung: Secondary | ICD-10-CM | POA: Diagnosis not present

## 2015-02-13 LAB — PROTIME-INR
INR: 1.15 (ref 0.00–1.49)
Prothrombin Time: 14.9 seconds (ref 11.6–15.2)

## 2015-02-13 LAB — CBC
HEMATOCRIT: 38.3 % — AB (ref 39.0–52.0)
Hemoglobin: 13.4 g/dL (ref 13.0–17.0)
MCH: 34.7 pg — AB (ref 26.0–34.0)
MCHC: 35 g/dL (ref 30.0–36.0)
MCV: 99.2 fL (ref 78.0–100.0)
PLATELETS: 284 10*3/uL (ref 150–400)
RBC: 3.86 MIL/uL — AB (ref 4.22–5.81)
RDW: 13.2 % (ref 11.5–15.5)
WBC: 5.8 10*3/uL (ref 4.0–10.5)

## 2015-02-13 LAB — APTT: aPTT: 32 seconds (ref 24–37)

## 2015-02-13 MED ORDER — LIDOCAINE HCL 1 % IJ SOLN
INTRAMUSCULAR | Status: AC
  Filled 2015-02-13: qty 20

## 2015-02-13 MED ORDER — HYDROCODONE-ACETAMINOPHEN 5-325 MG PO TABS: 1.0000 | ORAL_TABLET | ORAL | Status: DC | PRN

## 2015-02-13 MED ORDER — SODIUM CHLORIDE 0.9 % IV SOLN
INTRAVENOUS | Status: DC
  Administered 2015-02-13: 07:00:00 via INTRAVENOUS

## 2015-02-13 MED ORDER — MIDAZOLAM HCL 2 MG/2ML IJ SOLN
INTRAMUSCULAR | Status: AC | PRN
  Administered 2015-02-13: 1 mg via INTRAVENOUS

## 2015-02-13 MED ORDER — MIDAZOLAM HCL 2 MG/2ML IJ SOLN
INTRAMUSCULAR | Status: AC
  Filled 2015-02-13: qty 2

## 2015-02-13 MED ORDER — FENTANYL CITRATE (PF) 100 MCG/2ML IJ SOLN
INTRAMUSCULAR | Status: AC
  Filled 2015-02-13: qty 2

## 2015-02-13 MED ORDER — FENTANYL CITRATE (PF) 100 MCG/2ML IJ SOLN
INTRAMUSCULAR | Status: AC | PRN
  Administered 2015-02-13 (×2): 25 ug via INTRAVENOUS

## 2015-02-13 NOTE — H&P (Signed)
Chief Complaint: Patient was seen in consultation today for right lung mass biopsy at the request of Byrum,Robert S  Referring Physician(s): Byrum,Robert S  History of Present Illness: Gerald Ho is a 65 y.o. male   Hx COPD Ongoing tobacco use Followed by Dr Lamonte Sakai Known B pulmonary nodules Surveillance reveals enlargement of both Bronchoscopy performed 01/29/2015 Left nodule determined benign Rt nodule shows atypical cells Nodules +PET 12/26/14 Dr Lamonte Sakai suspicious and requesting needle biopsy of Rt lung mass Now scheduled for same Last dose coumadin 1 week ago  Past Medical History  Diagnosis Date  . Current smoker     2 packs per day  . High blood pressure   . Emphysema   . Dysrhythmia     Atrial Fibrillation  . Shortness of breath dyspnea   . Pneumonia     hx of  . Bipolar disorder ([place])     Takes Lamictal & Seroquel  . GERD (gastroesophageal reflux disease)   . Hyperlipemia     Past Surgical History  Procedure Laterality Date  . Lumbar disc surgery      X 2  . Cardioversion N/A 02/26/2014    Procedure: CARDIOVERSION;  Surgeon: Thayer Headings, MD;  Location: [facility] ENDOSCOPY;  Service: Cardiovascular;  Laterality: N/A;  . Colonoscopy w/ polypectomy    . Upper gi endoscopy    . Thumb fusion Right   . Video bronchoscopy with endobronchial navigation N/A 01/29/2015    Procedure: VIDEO BRONCHOSCOPY WITH ENDOBRONCHIAL NAVIGATION;  Surgeon: Collene Gobble, MD;  Location: [facility];  Service: Thoracic;  Laterality: N/A;  . Video bronchoscopy with endobronchial ultrasound N/A 01/29/2015    Procedure: VIDEO BRONCHOSCOPY WITH ENDOBRONCHIAL ULTRASOUND;  Surgeon: Collene Gobble, MD;  Location: [facility] OR;  Service: Thoracic;  Laterality: N/A;    Allergies: Review of patient's allergies indicates no known allergies.  Medications: Prior to Admission medications   Medication Sig Start Date End Date Taking? Authorizing Provider  albuterol (PROVENTIL HFA;VENTOLIN HFA) 108 (90  BASE) MCG/ACT inhaler Inhale 2 puffs into the lungs every 6 (six) hours as needed for wheezing or shortness of breath. 12/05/13  Yes Collene Gobble, MD  carvedilol (COREG) 12.5 MG tablet Take 1 tablet (12.5 mg total) by mouth 2 (two) times daily. 01/28/15  Yes Thayer Headings, MD  lamoTRIgine (LAMICTAL) 200 MG tablet Take 200 mg by mouth daily at 12 noon.    Yes Historical Provider, MD  losartan (COZAAR) 50 MG tablet Take 50 mg by mouth daily at 12 noon.    Yes Historical Provider, MD  pravastatin (PRAVACHOL) 20 MG tablet Take 20 mg by mouth daily at 12 noon.    Yes Historical Provider, MD  QUEtiapine (SEROQUEL) 100 MG tablet Take 100 mg by mouth at bedtime.    Yes Historical Provider, MD  RABEprazole (ACIPHEX) 20 MG tablet Take 20 mg by mouth daily at 12 noon.    Yes Historical Provider, MD  Umeclidinium-Vilanterol (ANORO ELLIPTA) 62.5-25 MCG/INH AEPB Inhale 1 puff into the lungs daily. 10/29/14  Yes Collene Gobble, MD  warfarin (COUMADIN) 5 MG tablet Take 0.5-1 tablets (2.5-5 mg total) by mouth daily at 12 noon. 2.'5mg'$  on Monday, Wednesday and Friday.  '5mg'$  all other days. 01/29/15  Yes Collene Gobble, MD     Family History  Problem Relation Age of Onset  . Emphysema Father   . Heart attack Father   . AAA (abdominal aortic aneurysm) Mother     Social History  Social History  . Marital Status: Divorced    Spouse Name: N/A  . Number of Children: N/A  . Years of Education: N/A   Social History Main Topics  . Smoking status: Current Every Day Smoker -- 0.25 packs/day for 40 years    Types: Cigarettes  . Smokeless tobacco: None     Comment: 3 Cigarettes per day/ counseled to quit.  . Alcohol Use: 0.0 oz/week    0 Standard drinks or equivalent per week     Comment: 3 beers/day  . Drug Use: None  . Sexual Activity: No   Other Topics Concern  . None   Social History Narrative    Review of Systems: A 12 point ROS discussed and pertinent positives are indicated in the HPI above.  All  other systems are negative.  Review of Systems  Constitutional: Negative for fever, diaphoresis, activity change, fatigue and unexpected weight change.  Respiratory: Positive for shortness of breath.   Cardiovascular: Negative for chest pain.  Musculoskeletal: Negative for back pain.  Neurological: Negative for weakness.  Psychiatric/Behavioral: Negative for behavioral problems and confusion.    Vital Signs: BP 138/78 mmHg  Pulse 66  Temp(Src) 97.6 F (36.4 C)  Ht 6' (1.829 m)  Wt 225 lb (102.059 kg)  BMI 30.51 kg/m2  SpO2 94%  Physical Exam  Constitutional: He is oriented to person, place, and time.  Cardiovascular: Normal rate and regular rhythm.   No murmur heard. Pulmonary/Chest: Effort normal and breath sounds normal. He has no wheezes.  Abdominal: Soft. Bowel sounds are normal. There is no tenderness.  Musculoskeletal: Normal range of motion.  Neurological: He is alert and oriented to person, place, and time.  Skin: Skin is warm and dry.  Psychiatric: He has a normal mood and affect. His behavior is normal. Judgment and thought content normal.  Nursing note and vitals reviewed.   Mallampati Score:  MD Evaluation Airway: WNL Heart: WNL Abdomen: WNL Chest/ Lungs: WNL ASA  Classification: 3 Mallampati/Airway Score: One  Imaging: Dg Chest 2 View  01/31/2015  CLINICAL DATA:  Left posterior chest pain for 3 days. Post biopsy. COPD, hypertension, smoker. EXAM: CHEST  2 VIEW COMPARISON:  01/29/2015 FINDINGS: There is hyperinflation of the lungs compatible with COPD. Diffuse peribronchial thickening no confluent opacities or effusions. Heart is mildly enlarged. Previously seen spiculated left upper lobe nodule on CT not as well visualized on today's chest x-ray. IMPRESSION: COPD/ chronic bronchitic changes.  Mild cardiomegaly. Electronically Signed   By: Rolm Baptise M.D.   On: 01/31/2015 16:55   Dg Chest Port 1 View  01/29/2015  CLINICAL DATA:  Status post bronchoscopy  with biopsy EXAM: PORTABLE CHEST 1 VIEW COMPARISON:  CT chest 12/26/2014 FINDINGS: The lungs are hyperinflated likely secondary to COPD. Small spiculated left apical pulmonary nodule. There is no pleural effusion or pneumothorax. The heart and mediastinal contours are unremarkable. The osseous structures are unremarkable. IMPRESSION: Spiculated left apical pulmonary nodule similar to the prior CT of the chest dated 12/26/2014. Electronically Signed   By: Kathreen Devoid   On: 01/29/2015 12:06   Dg C-arm Bronchoscopy  01/29/2015  CLINICAL DATA:  C-ARM BRONCHOSCOPY Fluoroscopy was utilized by the requesting physician.  No radiographic interpretation.    Labs:  CBC:  Recent Labs  02/22/14 1002 01/23/15 1451 02/13/15 0724  WBC 7.8 7.5 5.8  HGB 15.0 12.7* 13.4  HCT 43.5 37.6* 38.3*  PLT 277.0 256 284    COAGS:  Recent Labs  01/16/15 1433 01/23/15  1451 01/29/15 0722 02/13/15 0724  INR 3.0 2.42* 1.24 1.15  APTT  --  49* 33 32    BMP:  Recent Labs  02/22/14 1002 10/29/14 0942 01/23/15 1451  NA 131* 132* 132*  K 4.7 4.4 4.2  CL 96 98 102  CO2 29 25 21*  GLUCOSE 96 96 92  BUN '22 17 12  '$ CALCIUM 9.8 9.0 9.1  CREATININE 1.75* 1.46* 1.31*  GFRNONAA  --   --  56*  GFRAA  --   --  >60    LIVER FUNCTION TESTS:  Recent Labs  10/29/14 0942 01/23/15 1451  BILITOT 0.6 0.6  AST 19 17  ALT 10 13*  ALKPHOS 66 67  PROT 7.2 7.1  ALBUMIN 4.0 3.7    TUMOR MARKERS: No results for input(s): AFPTM, CEA, CA199, CHROMGRNA in the last 8760 hours.  Assessment and Plan:  COPD; ongoing tobacco use Bronchoscopy B lung nodules with Dr Lamonte Sakai 01/29/15 Left nodule benign; Rt nodule atypical Plan for biopsy of Rt lung mass Risks and Benefits discussed with the patient including, but not limited to bleeding, hemoptysis, respiratory failure requiring intubation, infection, pneumothorax requiring chest tube placement, stroke from air embolism or even death. All of the patient's questions  were answered, patient is agreeable to proceed. Consent signed and in chart.   Thank you for this interesting consult.  I greatly enjoyed meeting Gerald Ho and look forward to participating in their care.  A copy of this report was sent to the requesting provider on this date.  Electronically Signed: Monia Sabal A 02/13/2015, 8:27 AM   I spent a total of  30 Minutes   in face to face in clinical consultation, greater than 50% of which was counseling/coordinating care for Right lung mass biopsy

## 2015-02-13 NOTE — Procedures (Signed)
CT guided core biopsies of lesion in right lower lobe superior segment.  2 cores obtained.  Biosentry tract sealant placed.  No immediate complication. Minimal blood loss.

## 2015-02-13 NOTE — Progress Notes (Signed)
Dr Anselm Pancoast notified of O2 sats running 85-88 at times and client states not short of breath no new orders noted and ok to d/c home

## 2015-02-13 NOTE — Sedation Documentation (Signed)
Patient denies pain and is resting comfortably.  

## 2015-02-13 NOTE — Discharge Instructions (Signed)

## 2015-02-18 ENCOUNTER — Telehealth: Payer: Self-pay | Admitting: Cardiovascular Disease

## 2015-02-18 NOTE — Telephone Encounter (Signed)
Spoke with pt and he did have his Lung Bx done on 02/13/15 and restarted Coumadin  on 02/15/15. Thus, f/u made on 2/20/147.

## 2015-02-18 NOTE — Telephone Encounter (Signed)
New Message   Pt called states that hasnt taken his coumadin in some time because of his many procedures. Request a call back to discuss.

## 2015-02-20 ENCOUNTER — Telehealth: Payer: Self-pay | Admitting: Emergency Medicine

## 2015-02-20 DIAGNOSIS — C3491 Malignant neoplasm of unspecified part of right bronchus or lung: Secondary | ICD-10-CM

## 2015-02-20 NOTE — Telephone Encounter (Signed)
Called and spoke with Dianne. She states that the patient had a CT biopsy on 02/13/15. Per Levander Campion she states that RB should have results back from CT biopsy and is requesting a message be sent to him for results. She states her brother is on pends a needles and would like to receive the results to easy his nerves. I explained to her that I will send a message and that once we receive RB's results and recs we will return the pt's call. She voiced understanding and had no further questions.   RB please advise on CT biopsy results, thanks

## 2015-02-20 NOTE — Telephone Encounter (Signed)
Called and spoke with the pt. Pt is requesting labs from bronchoscopy on 01/29/15. Pt stated that Dr. Anselm Pancoast informed him that our office should receive the lab results the first of the week. I explained to him that we have not received the final results and once our office received the results we would call him with RB's recs.. Pt voiced understanding and had no further questions. Nothing further needed.

## 2015-02-21 ENCOUNTER — Telehealth: Payer: Self-pay | Admitting: *Deleted

## 2015-02-21 DIAGNOSIS — R918 Other nonspecific abnormal finding of lung field: Secondary | ICD-10-CM

## 2015-02-21 NOTE — Telephone Encounter (Signed)
Oncology Nurse Navigator Documentation  Oncology Nurse Navigator Flowsheets 02/21/2015  Navigator Encounter Type Introductory phone call/I received referral on Gerald Ho today.  I gave him an appt to see Dr. Julien Nordmann on 02/24/15 arrive at 3:00.  Patient verbalized understanding of appt time and place.   Treatment Phase Abnormal Scans  Barriers/Navigation Needs Coordination of Care  Interventions Coordination of Care  Coordination of Care Appts  Acuity Level 1  Time Spent with Patient 15

## 2015-02-21 NOTE — Telephone Encounter (Signed)
I discussed the results with Gerald Ho today. The RLL lesion shows squamous cell CA. I told him we will refer to Oncology, [place].

## 2015-02-24 ENCOUNTER — Ambulatory Visit (HOSPITAL_BASED_OUTPATIENT_CLINIC_OR_DEPARTMENT_OTHER): Payer: BLUE CROSS/BLUE SHIELD | Admitting: Internal Medicine

## 2015-02-24 ENCOUNTER — Telehealth: Payer: Self-pay | Admitting: Internal Medicine

## 2015-02-24 ENCOUNTER — Other Ambulatory Visit: Payer: Self-pay | Admitting: *Deleted

## 2015-02-24 ENCOUNTER — Encounter: Payer: Self-pay | Admitting: Internal Medicine

## 2015-02-24 ENCOUNTER — Ambulatory Visit (INDEPENDENT_AMBULATORY_CARE_PROVIDER_SITE_OTHER): Payer: BLUE CROSS/BLUE SHIELD | Admitting: *Deleted

## 2015-02-24 ENCOUNTER — Ambulatory Visit (HOSPITAL_BASED_OUTPATIENT_CLINIC_OR_DEPARTMENT_OTHER): Payer: BLUE CROSS/BLUE SHIELD

## 2015-02-24 VITALS — BP 114/99 | HR 93 | Temp 98.8°F | Resp 18 | Ht 72.0 in | Wt 228.4 lb

## 2015-02-24 DIAGNOSIS — Z5181 Encounter for therapeutic drug level monitoring: Secondary | ICD-10-CM | POA: Diagnosis not present

## 2015-02-24 DIAGNOSIS — C3491 Malignant neoplasm of unspecified part of right bronchus or lung: Secondary | ICD-10-CM

## 2015-02-24 DIAGNOSIS — I4891 Unspecified atrial fibrillation: Secondary | ICD-10-CM | POA: Diagnosis not present

## 2015-02-24 DIAGNOSIS — C3490 Malignant neoplasm of unspecified part of unspecified bronchus or lung: Secondary | ICD-10-CM

## 2015-02-24 DIAGNOSIS — R918 Other nonspecific abnormal finding of lung field: Secondary | ICD-10-CM

## 2015-02-24 DIAGNOSIS — C3431 Malignant neoplasm of lower lobe, right bronchus or lung: Secondary | ICD-10-CM | POA: Diagnosis not present

## 2015-02-24 HISTORY — DX: Malignant neoplasm of unspecified part of unspecified bronchus or lung: C34.90

## 2015-02-24 LAB — COMPREHENSIVE METABOLIC PANEL
ALT: 10 U/L (ref 0–55)
AST: 16 U/L (ref 5–34)
Albumin: 4 g/dL (ref 3.5–5.0)
Alkaline Phosphatase: 89 U/L (ref 40–150)
Anion Gap: 10 mEq/L (ref 3–11)
BUN: 10.4 mg/dL (ref 7.0–26.0)
CALCIUM: 9.4 mg/dL (ref 8.4–10.4)
CHLORIDE: 101 meq/L (ref 98–109)
CO2: 25 meq/L (ref 22–29)
CREATININE: 1.3 mg/dL (ref 0.7–1.3)
EGFR: 55 mL/min/{1.73_m2} — ABNORMAL LOW (ref >90)
Glucose: 95 mg/dl (ref 70–140)
Potassium: 4.4 mEq/L (ref 3.5–5.1)
Sodium: 136 mEq/L (ref 136–145)
Total Bilirubin: 1.05 mg/dL (ref 0.20–1.20)
Total Protein: 7.6 g/dL (ref 6.4–8.3)

## 2015-02-24 LAB — CBC WITH DIFFERENTIAL/PLATELET
BASO%: 0.4 % (ref 0.0–2.0)
BASOS ABS: 0 10*3/uL (ref 0.0–0.1)
EOS%: 3.7 % (ref 0.0–7.0)
Eosinophils Absolute: 0.3 10*3/uL (ref 0.0–0.5)
HEMATOCRIT: 38.2 % — AB (ref 38.4–49.9)
HGB: 13.4 g/dL (ref 13.0–17.1)
LYMPH%: 27.8 % (ref 14.0–49.0)
MCH: 34.7 pg — ABNORMAL HIGH (ref 27.2–33.4)
MCHC: 35.1 g/dL (ref 32.0–36.0)
MCV: 99 fL — ABNORMAL HIGH (ref 79.3–98.0)
MONO#: 0.6 10*3/uL (ref 0.1–0.9)
MONO%: 7.5 % (ref 0.0–14.0)
NEUT#: 5.1 10*3/uL (ref 1.5–6.5)
NEUT%: 60.6 % (ref 39.0–75.0)
Platelets: 251 10*3/uL (ref 140–400)
RBC: 3.86 10*6/uL — AB (ref 4.20–5.82)
RDW: 13.1 % (ref 11.0–14.6)
WBC: 8.4 10*3/uL (ref 4.0–10.3)
lymph#: 2.3 10*3/uL (ref 0.9–3.3)

## 2015-02-24 LAB — POCT INR: INR: 2.5

## 2015-02-24 NOTE — Telephone Encounter (Signed)
Gave patient avs report and appointment with Dr. Tammi Klippel for 3/2. [facility] will call patient re mri apointment - patient aware. F/u with MM pending - no availability - message to MM re 2 week f/u. Patient aware he will be contacted re f/u.

## 2015-02-24 NOTE — Patient Instructions (Signed)
Smoking Cessation, Tips for Success If you are ready to quit smoking, congratulations! You have chosen to help yourself be healthier. Cigarettes bring nicotine, tar, carbon monoxide, and other irritants into your body. Your lungs, heart, and blood vessels will be able to work better without these poisons. There are many different ways to quit smoking. Nicotine gum, nicotine patches, a nicotine inhaler, or nicotine nasal spray can help with physical craving. Hypnosis, support groups, and medicines help break the habit of smoking. WHAT THINGS CAN I DO TO MAKE QUITTING EASIER?  Here are some tips to help you quit for good:  Pick a date when you will quit smoking completely. Tell all of your friends and family about your plan to quit on that date.  Do not try to slowly cut down on the number of cigarettes you are smoking. Pick a quit date and quit smoking completely starting on that day.  Throw away all cigarettes.   Clean and remove all ashtrays from your home, work, and car.  On a card, write down your reasons for quitting. Carry the card with you and read it when you get the urge to smoke.  Cleanse your body of nicotine. Drink enough water and fluids to keep your urine clear or pale yellow. Do this after quitting to flush the nicotine from your body.  Learn to predict your moods. Do not let a bad situation be your excuse to have a cigarette. Some situations in your life might tempt you into wanting a cigarette.  Never have "just one" cigarette. It leads to wanting another and another. Remind yourself of your decision to quit.  Change habits associated with smoking. If you smoked while driving or when feeling stressed, try other activities to replace smoking. Stand up when drinking your coffee. Brush your teeth after eating. Sit in a different chair when you read the paper. Avoid alcohol while trying to quit, and try to drink fewer caffeinated beverages. Alcohol and caffeine may urge you to  smoke.  Avoid foods and drinks that can trigger a desire to smoke, such as sugary or spicy foods and alcohol.  Ask people who smoke not to smoke around you.  Have something planned to do right after eating or having a cup of coffee. For example, plan to take a walk or exercise.  Try a relaxation exercise to calm you down and decrease your stress. Remember, you may be tense and nervous for the first 2 weeks after you quit, but this will pass.  Find new activities to keep your hands busy. Play with a pen, coin, or rubber band. Doodle or draw things on paper.  Brush your teeth right after eating. This will help cut down on the craving for the taste of tobacco after meals. You can also try mouthwash.   Use oral substitutes in place of cigarettes. Try using lemon drops, carrots, cinnamon sticks, or chewing gum. Keep them handy so they are available when you have the urge to smoke.  When you have the urge to smoke, try deep breathing.  Designate your home as a nonsmoking area.  If you are a heavy smoker, ask your health care provider about a prescription for nicotine chewing gum. It can ease your withdrawal from nicotine.  Reward yourself. Set aside the cigarette money you save and buy yourself something nice.  Look for support from others. Join a support group or smoking cessation program. Ask someone at home or at work to help you with your plan   to quit smoking.  Always ask yourself, "Do I need this cigarette or is this just a reflex?" Tell yourself, "Today, I choose not to smoke," or "I do not want to smoke." You are reminding yourself of your decision to quit.  Do not replace cigarette smoking with electronic cigarettes (commonly called e-cigarettes). The safety of e-cigarettes is unknown, and some may contain harmful chemicals.  If you relapse, do not give up! Plan ahead and think about what you will do the next time you get the urge to smoke. HOW WILL I FEEL WHEN I QUIT SMOKING? You  may have symptoms of withdrawal because your body is used to nicotine (the addictive substance in cigarettes). You may crave cigarettes, be irritable, feel very hungry, cough often, get headaches, or have difficulty concentrating. The withdrawal symptoms are only temporary. They are strongest when you first quit but will go away within 10-14 days. When withdrawal symptoms occur, stay in control. Think about your reasons for quitting. Remind yourself that these are signs that your body is healing and getting used to being without cigarettes. Remember that withdrawal symptoms are easier to treat than the major diseases that smoking can cause.  Even after the withdrawal is over, expect periodic urges to smoke. However, these cravings are generally short lived and will go away whether you smoke or not. Do not smoke! WHAT RESOURCES ARE AVAILABLE TO HELP ME QUIT SMOKING? Your health care provider can direct you to community resources or hospitals for support, which may include:  Group support.  Education.  Hypnosis.  Therapy.   This information is not intended to replace advice given to you by your health care provider. Make sure you discuss any questions you have with your health care provider.   Document Released: 09/19/2003 Document Revised: 01/11/2014 Document Reviewed: 06/08/2012 Elsevier Interactive Patient Education 2016 [employer] Inc.  

## 2015-02-24 NOTE — Progress Notes (Signed)
[place] Telephone:([phone]   [phone]  CONSULT NOTE  REFERRING PHYSICIAN: Dr. Baltazar Apo  REASON FOR CONSULTATION:  65 years old white male recently diagnosed with lung cancer.  HPI Gerald Ho is a 65 y.o. male with past medical history significant for COPD, congestive heart failure, atrial fibrillation, hypertension, hypercholesterolemia, history of alcohol abuse as well as long history of smoking. The patient had CT screening of the chest on 10/06/2012 because of his long history of smoking and was found to have suspicious for lung cancer. There was also focal area of nodular opacification in the superior segment of the right lower lobe and borderline enlarged mediastinal lymph nodes anterior to the right mainstem bronchus. A PET scan was performed on 01/11/2013 and it showed similar appearance of the irregular prescription predominantly groundglass attenuation nodular opacity in the apex of the left upper lobe with no hypermetabolism and follow-up imaging studies was recommended. The patient was followed by annual CT scan of the chest that showed no concerning findings until CT scan of the chest on 12/19/2014 which showed increase in the size of the black-like area and the posterior aspect of the superior segment of the right lower lobe and the mixed solid and sub-solid nodule in the posterior aspect of the left upper lobe. There was also multiple borderline enlarged and minimally enlarged mediastinal and hilar lymph node similar to the prior examination and favored to be benign. A PET scan on 12/26/2014 showed 1.0 cm irregular pulmonary nodule in the posterior left upper lobe was hypermetabolic with SUV max of 5.1. There was also pleural based pulmonary nodule and this appears segment of the right lower lobe measuring 1.7 x 3.2 cm with SUV max of 10.3. There is no hypermetabolic hilar lymph nodes identified but there are several small less than 1.0 cm  mediastinal lymph nodes in the right paratracheal and AP window region measuring up to 1.0 cm with no significant increased and the low-grade metabolic activity that slightly above the mediastinal baseline and SUV max of 3.7. The patient has no evidence of distant metastatic disease. On 01/25/2015 he underwent bronchoscopy under the care of Dr. Lamonte Sakai that showed atypical cells in the left upper lobe and right lower lobe nodular biopsies. The mediastinal lymph nodes were negative for malignancy. On 02/13/2015 the patient underwent CT-guided core biopsy of the right lower lobe lesion by interventional radiology and the final pathology (Accession: [id number]) was consistent with squamous cell carcinoma. Dr. Lamonte Sakai kindly referred the patient to me today for evaluation and recommendation regarding treatment of his condition. When seen today the patient continues to have baseline shortness of breath and he is currently on oxygen for the last 2 years. He denied having any significant chest pain but continues to have cough with no hemoptysis. He denied having any significant weight loss or night sweats. He has no headache or visual changes. Has no significant nausea, vomiting, diarrhea or constipation. Family history significant for mother died from abdominal aortic aneurysm rupture and father had heart attack. He also has a maternal aunt with pancreatic cancer. The patient is divorced and has one daughter age 32. He has a history of smoking up to 2 pack per day for around 46 years and he currently smoking a few cigarettes every day. He also has a history of alcohol abuse and past history of drug abuse but not recently.  HPI  Past Medical History  Diagnosis Date  . Current smoker  2 packs per day  . High blood pressure   . Emphysema   . Dysrhythmia     Atrial Fibrillation  . Shortness of breath dyspnea   . Pneumonia     hx of  . Bipolar disorder ([place])     Takes Lamictal & Seroquel  . GERD  (gastroesophageal reflux disease)   . Hyperlipemia   . Non-small cell carcinoma of lung, stage 1 ([place]) 02/24/2015    Past Surgical History  Procedure Laterality Date  . Lumbar disc surgery      X 2  . Cardioversion N/A 02/26/2014    Procedure: CARDIOVERSION;  Surgeon: Thayer Headings, MD;  Location: [facility] ENDOSCOPY;  Service: Cardiovascular;  Laterality: N/A;  . Colonoscopy w/ polypectomy    . Upper gi endoscopy    . Thumb fusion Right   . Video bronchoscopy with endobronchial navigation N/A 01/29/2015    Procedure: VIDEO BRONCHOSCOPY WITH ENDOBRONCHIAL NAVIGATION;  Surgeon: Collene Gobble, MD;  Location: [place];  Service: Thoracic;  Laterality: N/A;  . Video bronchoscopy with endobronchial ultrasound N/A 01/29/2015    Procedure: VIDEO BRONCHOSCOPY WITH ENDOBRONCHIAL ULTRASOUND;  Surgeon: Collene Gobble, MD;  Location: [facility] OR;  Service: Thoracic;  Laterality: N/A;    Family History  Problem Relation Age of Onset  . Emphysema Father   . Heart attack Father   . AAA (abdominal aortic aneurysm) Mother     Social History Social History  Substance Use Topics  . Smoking status: Current Every Day Smoker -- 0.25 packs/day for 40 years  . Smokeless tobacco: Current User    Types: Chew     Comment: 4 Cigarettes per day/ counseled to quit.  . Alcohol Use: 2.4 oz/week    0 Standard drinks or equivalent, 4 Cans of beer, 0 Glasses of wine, 0 Shots of liquor per week     Comment: 4 cans of beer/ day    No Known Allergies  Current Outpatient Prescriptions  Medication Sig Dispense Refill  . albuterol (PROVENTIL HFA;VENTOLIN HFA) 108 (90 BASE) MCG/ACT inhaler Inhale 2 puffs into the lungs every 6 (six) hours as needed for wheezing or shortness of breath. 1 Inhaler 6  . carvedilol (COREG) 12.5 MG tablet Take 1 tablet (12.5 mg total) by mouth 2 (two) times daily. 62 tablet 11  . lamoTRIgine (LAMICTAL) 200 MG tablet Take 200 mg by mouth daily at 12 noon.     Marland Kitchen losartan (COZAAR) 50 MG tablet Take 50 mg  by mouth daily at 12 noon.     . pravastatin (PRAVACHOL) 20 MG tablet Take 20 mg by mouth daily at 12 noon.     . QUEtiapine (SEROQUEL) 100 MG tablet Take 100 mg by mouth at bedtime.     . RABEprazole (ACIPHEX) 20 MG tablet Take 20 mg by mouth daily at 12 noon.     Marland Kitchen Umeclidinium-Vilanterol (ANORO ELLIPTA) 62.5-25 MCG/INH AEPB Inhale 1 puff into the lungs daily. 60 each 5  . warfarin (COUMADIN) 5 MG tablet Take 0.5-1 tablets (2.5-5 mg total) by mouth daily at 12 noon. 2.'5mg'$  on Monday, Wednesday and Friday.  '5mg'$  all other days. 120 tablet 1   No current facility-administered medications for this visit.    Review of Systems  Constitutional: negative Eyes: negative Ears, nose, mouth, throat, and face: negative Respiratory: positive for cough, dyspnea on exertion and wheezing Cardiovascular: negative Gastrointestinal: negative Genitourinary:negative Integument/breast: negative Hematologic/lymphatic: negative Musculoskeletal:negative Neurological: negative Behavioral/Psych: negative Endocrine: negative Allergic/Immunologic: negative  Physical Exam  NLG:XQJJH,  healthy, no distress, well nourished and well developed SKIN: skin color, texture, turgor are normal, no rashes or significant lesions HEAD: Normocephalic, No masses, lesions, tenderness or abnormalities EYES: normal, PERRLA, Conjunctiva are pink and non-injected EARS: External ears normal, Canals clear OROPHARYNX:no exudate, no erythema and lips, buccal mucosa, and tongue normal  NECK: supple, no adenopathy, no JVD LYMPH:  no palpable lymphadenopathy, no hepatosplenomegaly LUNGS: expiratory wheezes bilaterally, scattered rhonchi bilaterally HEART: regular rate & rhythm, no murmurs and no gallops ABDOMEN:abdomen soft, non-tender, normal bowel sounds and no masses or organomegaly BACK: Back symmetric, no curvature., No CVA tenderness EXTREMITIES:no joint deformities, effusion, or inflammation, no edema, no skin discoloration    NEURO: alert & oriented x 3 with fluent speech, no focal motor/sensory deficits  PERFORMANCE STATUS: ECOG 1  LABORATORY DATA: Lab Results  Component Value Date   WBC 8.4 02/24/2015   HGB 13.4 02/24/2015   HCT 38.2* 02/24/2015   MCV 99.0* 02/24/2015   PLT 251 02/24/2015      Chemistry      Component Value Date/Time   NA 136 02/24/2015 1500   NA 132* 01/23/2015 1451   K 4.4 02/24/2015 1500   K 4.2 01/23/2015 1451   CL 102 01/23/2015 1451   CO2 25 02/24/2015 1500   CO2 21* 01/23/2015 1451   BUN 10.4 02/24/2015 1500   BUN 12 01/23/2015 1451   CREATININE 1.3 02/24/2015 1500   CREATININE 1.31* 01/23/2015 1451   CREATININE 1.46* 10/29/2014 0942      Component Value Date/Time   CALCIUM 9.4 02/24/2015 1500   CALCIUM 9.1 01/23/2015 1451   ALKPHOS 89 02/24/2015 1500   ALKPHOS 67 01/23/2015 1451   AST 16 02/24/2015 1500   AST 17 01/23/2015 1451   ALT 10 02/24/2015 1500   ALT 13* 01/23/2015 1451   BILITOT 1.05 02/24/2015 1500   BILITOT 0.6 01/23/2015 1451       RADIOGRAPHIC STUDIES: Dg Chest 1 View  02/13/2015  CLINICAL DATA:  Status post right lower lobe nodule biopsy EXAM: CHEST 1 VIEW COMPARISON:  01/29/2015 chest radiograph. FINDINGS: Stable cardiomediastinal silhouette with normal heart size. No pneumothorax. No pleural effusion. Known pulmonary nodules in the right mid lung and medial left upper lung appear stable. No pulmonary edema. No acute consolidative airspace disease. Mild bibasilar atelectasis. IMPRESSION: 1. No pneumothorax. 2. Mild bibasilar atelectasis. 3. Stable known pulmonary nodules in the right mid and medial left upper lungs. Electronically Signed   By: Ilona Sorrel M.D.   On: 02/13/2015 11:22   Dg Chest 2 View  01/31/2015  CLINICAL DATA:  Left posterior chest pain for 3 days. Post biopsy. COPD, hypertension, smoker. EXAM: CHEST  2 VIEW COMPARISON:  01/29/2015 FINDINGS: There is hyperinflation of the lungs compatible with COPD. Diffuse peribronchial  thickening no confluent opacities or effusions. Heart is mildly enlarged. Previously seen spiculated left upper lobe nodule on CT not as well visualized on today's chest x-ray. IMPRESSION: COPD/ chronic bronchitic changes.  Mild cardiomegaly. Electronically Signed   By: Rolm Baptise M.D.   On: 01/31/2015 16:55   Ct Biopsy  02/13/2015  INDICATION: 65 year old with bilateral suspicious lung nodules. Request for biopsy of the lesion in the superior segment of the right lower lobe. The patient is a smoker and uses home oxygen. EXAM: CT-GUIDED BIOPSY OF RIGHT LOWER LOBE LESION. MEDICATIONS: None. ANESTHESIA/SEDATION: Moderate (conscious) sedation was employed during this procedure. A total of Versed 1.0 mg and Fentanyl 50 mcg was administered intravenously. Moderate Sedation Time: 20  minutes. The patient's level of consciousness and vital signs were monitored continuously by radiology nursing throughout the procedure under my direct supervision. FLUOROSCOPY TIME:  None COMPLICATIONS: None immediate. PROCEDURE: Informed written consent was obtained from the patient after a thorough discussion of the procedural risks, benefits and alternatives. All questions were addressed. A timeout was performed prior to the initiation of the procedure. Patient was placed prone on the CT scanner. Images through the chest were obtained. The pleural-based lesion along the superior segment of the right lower lobe was identified. The skin was prepped with chlorhexidine and a sterile field was created. Using CT guidance, a 17 gauge needle was directed into the lesion and 2 core biopsies were obtained with an 18 gauge core device. A [employer] tract sealant was deployed as the needle was removed. Bandage placed at the puncture site. FINDINGS: There is a 4.1 cm pleural-based lesion along the posterior aspect of the right lower lobe superior segment. Needle position was confirmed within the lesion. No significant pneumothorax following the core  biopsies. Patient has underlying emphysema. IMPRESSION: CT-guided core biopsies of right lower lobe lesion. Electronically Signed   By: Markus Daft M.D.   On: 02/13/2015 15:00   Dg Chest Port 1 View  01/29/2015  CLINICAL DATA:  Status post bronchoscopy with biopsy EXAM: PORTABLE CHEST 1 VIEW COMPARISON:  CT chest 12/26/2014 FINDINGS: The lungs are hyperinflated likely secondary to COPD. Small spiculated left apical pulmonary nodule. There is no pleural effusion or pneumothorax. The heart and mediastinal contours are unremarkable. The osseous structures are unremarkable. IMPRESSION: Spiculated left apical pulmonary nodule similar to the prior CT of the chest dated 12/26/2014. Electronically Signed   By: Kathreen Devoid   On: 01/29/2015 12:06   Dg C-arm Bronchoscopy  01/29/2015  CLINICAL DATA:  C-ARM BRONCHOSCOPY Fluoroscopy was utilized by the requesting physician.  No radiographic interpretation.    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 The patient also has suspicious mediastinal lymphadenopathy as well as left upper lobe lung nodule concerning for synchronous primary lung cancer.   PLAN: I had a lengthy discussion with the patient about his current disease stage, prognosis and treatment options. I will complete the staging workup by ordering a MRI of the brain to rule out brain metastasis. I discussed with the patient several treatment options. He is not a good surgical candidate for resection because of his poor pulmonary function. I gave the patient the option of curative radiotherapy with stereotactic body radiotherapy to the right lower lobe lung mass as well as the left upper lobe pulmonary nodule versus consideration of a course  of concurrent chemoradiation to the right lower lobe lung nodule as well as mediastinum followed by stereotactic radiotherapy to the left upper lobe. I also discussed with the patient testing his tumor for PDL 1 expression for consideration of treatment with immunotherapy as a systemic option for the bilateral lung disease. I referred the patient to Dr. Tammi Klippel from radiation oncology for his opinion regarding the best option for treatment of this patient. I will see him back for follow-up visit in 2 weeks for reevaluation and more detailed discussion of his treatment options after meeting with Dr. Tammi Klippel. The patient was advised to call immediately if he  has any concerning symptoms in the interval. The patient voices understanding of current disease status and treatment options and is in agreement with the current care plan.  All questions were answered. The patient knows to call the clinic with any problems, questions or concerns. We can certainly see the patient much sooner if necessary.  Thank you so much for allowing me to participate in the care of Gerald Ho. I will continue to follow up the patient with you and assist in his care.  I spent 40 minutes counseling the patient face to face. The total time spent in the appointment was 60 minutes.  Disclaimer: This note was dictated with voice recognition software. Similar sounding words can inadvertently be transcribed and may not be corrected upon review.   Farah Benish K. February 24, 2015, 4:04 PM

## 2015-02-25 ENCOUNTER — Telehealth: Payer: Self-pay | Admitting: Internal Medicine

## 2015-02-25 ENCOUNTER — Telehealth: Payer: Self-pay | Admitting: Medical Oncology

## 2015-02-25 ENCOUNTER — Other Ambulatory Visit (HOSPITAL_COMMUNITY)
Admission: RE | Admit: 2015-02-25 | Discharge: 2015-02-25 | Disposition: A | Discharge status: Home / Self Care | Payer: BLUE CROSS/BLUE SHIELD | Source: Ambulatory Visit | Attending: Internal Medicine | Admitting: Internal Medicine

## 2015-02-25 DIAGNOSIS — C3431 Malignant neoplasm of lower lobe, right bronchus or lung: Secondary | ICD-10-CM | POA: Diagnosis present

## 2015-02-25 LAB — FUNGUS CULTURE W SMEAR
FUNGAL SMEAR: NONE SEEN
Fungal Smear: NONE SEEN

## 2015-02-25 NOTE — Telephone Encounter (Signed)
Per staff message reply from MM he will see patient 3/3 @ 9 am. Spoke with patient he is aware.

## 2015-02-25 NOTE — Telephone Encounter (Signed)
Asking about appts mri and Dr Tammi Klippel -will Tammi Klippel have MRI results if needed. I told him he should but that Tammi Klippel is seeing him for his R lower lung cancer.

## 2015-02-28 ENCOUNTER — Encounter: Payer: Self-pay | Admitting: *Deleted

## 2015-02-28 NOTE — Progress Notes (Signed)
Oncology Nurse Navigator Documentation  Oncology Nurse Navigator Flowsheets 02/28/2015  Navigator Encounter Type Other/I notified Tammy with [facility] pathology to send tissue for PDL 1 testing  Treatment Phase Pre-Tx/Tx Discussion  Barriers/Navigation Needs Coordination of Care  Acuity Level 1  Time Spent with Patient 15

## 2015-03-03 ENCOUNTER — Encounter (HOSPITAL_COMMUNITY): Payer: Self-pay

## 2015-03-05 ENCOUNTER — Encounter: Payer: Self-pay | Admitting: Radiation Oncology

## 2015-03-05 ENCOUNTER — Ambulatory Visit (HOSPITAL_COMMUNITY)
Admission: RE | Admit: 2015-03-05 | Discharge: 2015-03-05 | Disposition: A | Discharge status: Home / Self Care | Payer: BLUE CROSS/BLUE SHIELD | Source: Ambulatory Visit | Attending: Internal Medicine | Admitting: Internal Medicine

## 2015-03-05 DIAGNOSIS — I6782 Cerebral ischemia: Secondary | ICD-10-CM | POA: Insufficient documentation

## 2015-03-05 DIAGNOSIS — C3491 Malignant neoplasm of unspecified part of right bronchus or lung: Secondary | ICD-10-CM | POA: Diagnosis present

## 2015-03-05 MED ORDER — GADOBENATE DIMEGLUMINE 529 MG/ML IV SOLN
20.0000 mL | Freq: Once | INTRAVENOUS | Status: AC | PRN
  Administered 2015-03-05: 20 mL via INTRAVENOUS

## 2015-03-05 NOTE — Progress Notes (Signed)
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    Tobacco/Marijuana/Snuff/ETOH use: current everyday smoker. "only smokes like three cigarettes per day and dipping."  Past/Anticipated interventions by cardiothoracic surgery, if any: not a good surgical candidate due to poor pulmonary function  Past/Anticipated interventions by medical oncology, if any: staging workup and discussion of treatment options to include chemoradiation  Signs/Symptoms  Weight changes, if any: no. reports weight gain related to effects of seroquel.  Respiratory complaints, if any: shortness of breath, productive cough with scant phylum only first thing in the morning, needs continuous oxygen therapy uses 2 liters while in active and 4 liters when active  Hemoptysis, if any: no  Pain issues, if any:  No; reports tingling in his legs related to past back surgeries  SAFETY ISSUES:  Prior radiation? no  Pacemaker/ICD? no   Possible current pregnancy?no  Is the patient on methotrexate? no  Current Complaints / other details: 65 year old male. Maternal aunt with pancreatic caner. Divorced with one grown daughter. Everyday smoker.   03/05/2015 Brain MRI show no evidence of metastatic disease.

## 2015-03-06 ENCOUNTER — Encounter: Payer: Self-pay | Admitting: Radiation Oncology

## 2015-03-06 ENCOUNTER — Ambulatory Visit
Admission: RE | Admit: 2015-03-06 | Discharge: 2015-03-06 | Disposition: A | Discharge status: Home / Self Care | Payer: BLUE CROSS/BLUE SHIELD | Source: Ambulatory Visit | Attending: Radiation Oncology | Admitting: Radiation Oncology

## 2015-03-06 VITALS — BP 141/87 | HR 70 | Temp 97.5°F | Resp 18 | Ht 72.0 in | Wt 229.5 lb

## 2015-03-06 DIAGNOSIS — Z8249 Family history of ischemic heart disease and other diseases of the circulatory system: Secondary | ICD-10-CM | POA: Insufficient documentation

## 2015-03-06 DIAGNOSIS — F1010 Alcohol abuse, uncomplicated: Secondary | ICD-10-CM | POA: Diagnosis not present

## 2015-03-06 DIAGNOSIS — Z7901 Long term (current) use of anticoagulants: Secondary | ICD-10-CM | POA: Diagnosis not present

## 2015-03-06 DIAGNOSIS — F319 Bipolar disorder, unspecified: Secondary | ICD-10-CM | POA: Insufficient documentation

## 2015-03-06 DIAGNOSIS — Z510 Encounter for antineoplastic radiation therapy: Secondary | ICD-10-CM | POA: Diagnosis present

## 2015-03-06 DIAGNOSIS — I4891 Unspecified atrial fibrillation: Secondary | ICD-10-CM | POA: Diagnosis not present

## 2015-03-06 DIAGNOSIS — Z79899 Other long term (current) drug therapy: Secondary | ICD-10-CM | POA: Insufficient documentation

## 2015-03-06 DIAGNOSIS — C3491 Malignant neoplasm of unspecified part of right bronchus or lung: Secondary | ICD-10-CM

## 2015-03-06 DIAGNOSIS — C3431 Malignant neoplasm of lower lobe, right bronchus or lung: Secondary | ICD-10-CM | POA: Insufficient documentation

## 2015-03-06 DIAGNOSIS — E785 Hyperlipidemia, unspecified: Secondary | ICD-10-CM | POA: Diagnosis not present

## 2015-03-06 DIAGNOSIS — K219 Gastro-esophageal reflux disease without esophagitis: Secondary | ICD-10-CM | POA: Diagnosis not present

## 2015-03-06 DIAGNOSIS — I10 Essential (primary) hypertension: Secondary | ICD-10-CM | POA: Diagnosis not present

## 2015-03-06 DIAGNOSIS — Z809 Family history of malignant neoplasm, unspecified: Secondary | ICD-10-CM | POA: Insufficient documentation

## 2015-03-06 DIAGNOSIS — F17210 Nicotine dependence, cigarettes, uncomplicated: Secondary | ICD-10-CM | POA: Insufficient documentation

## 2015-03-06 DIAGNOSIS — J439 Emphysema, unspecified: Secondary | ICD-10-CM | POA: Diagnosis present

## 2015-03-06 NOTE — Progress Notes (Signed)
See progress note under physician encounter. 

## 2015-03-06 NOTE — Progress Notes (Signed)
Radiation Oncology         [phone] ________________________________  Initial Outpatient Consultation  Name: Gerald Ho MRN: [id number]  Date: 03/06/2015  DOB: Apr 25, 1950  CB:JSEGBT,DVVOHYWVP, MD  Curt Bears, MD   REFERRING PHYSICIAN: Curt Bears, MD  DIAGNOSIS: The encounter diagnosis was Non-small cell carcinoma of lung, stage 1, right ([place]).    ICD-9-CM ICD-10-CM   1. Non-small cell carcinoma of lung, stage 1, right ([facility]) 162.9 C34.91   65 y.o. gentleman with Stage IB (T2a, N0, M0) non-small cell lung cancer of the lower lobe of the right lung.  HISTORY OF PRESENT ILLNESS:Gerald Ho is a 65 y.o. male with past medical history for COPD and a long history of smoking. The patient had CT screening of the chest on 10/05/2012 due to his long history of smoking and found a left upper lobe nodule suspicious for lung cancer. There was also focal area of nodular opacification in the superior segment of the right lower lobe and borderline enlarged mediastinal lymph nodes anterior to the right mainstem bronchus. A PET scan was performed on 01/11/2013 and it showed similar appearance of the irregular prescription predominantly groundglass attenuation nodular opacity in the apex of the left upper lobe with no hypermetabolism and follow-up imaging studies were recommended. The patient was followed by annual CT scan of the chest that showed no concerning findings until CT scan of the chest on 12/17/2014 which showed increase in the size of the black-like area and the posterior aspect of the superior segment of the right lower lobe and the mixed solid and sub-solid nodule in the posterior aspect of the left upper lobe. There was also multiple borderline enlarged and minimally enlarged mediastinal and hilar lymph nodes similar to the prior examination and favored to be benign. A PET scan on 12/26/2014 showed a 1.0 cm irregular pulmonary nodule in the posterior left upper lobe hypermetabolic  with SUV max of 5.1. There was also a pleural based pulmonary nodule and in the superior segment of the right lower lobe measuring 1.7 x 3.2 cm with SUV max of 10.3. There are no hypermetabolic hilar lymph nodes identified, but there are several small mediastinal lymph nodes in the right paratracheal and AP window region measuring up to 1.0 cm with no significant increase in size and the low-grade metabolic activity that slightly above the mediastinal baseline and SUV max of 3.7. The patient has no evidence of distant metastatic disease. On 01/25/2015 he underwent bronchoscopy under the care of Dr. Lamonte Sakai that showed atypical cells in the left upper lobe and right lower lobe nodular biopsies. The mediastinal lymph nodes were negative for malignancy. On 02/13/2015 the patient underwent CT-guided core biopsy of the right lower lobe lesion by interventional radiology and the final pathology (Accession: [id number]) was consistent with squamous cell carcinoma. Dr. Lamonte Sakai referred the patient to Dr. Julien Nordmann on 02/24/2015 for evaluation and recommendation regarding treatment of his condition. Dr. Julien Nordmann ordered a test of the patient's tumor for PD-L1 expression for consideration of immunotherapy as a systemic option. The result was PD-L1 Tumor Proportion Score: 0% (No expression). The patient is not a good candidate for surgical resection. Dr. Julien Nordmann discussed options; such as curative radiotherapy with stereotactic body radiotherapy to the right lower lobe lung mass as well as the left upper lobe pulmonary nodule versus consideration of a course of concurrent chemoradiation to the right lower lobe lung nodule as well as mediastinum followed by stereotactic radiotherapy to the left upper lobe. MRI of  the brain on 03/05/2015 showed no evidence of metastasis. The patient presents for further recommendations of care with Dr. Tammi Klippel for either conventional radiotherapy versus SRS.  PREVIOUS RADIATION THERAPY: No  PAST  MEDICAL HISTORY:  Past Medical History  Diagnosis Date  . Current smoker     2 packs per day  . High blood pressure   . Emphysema   . Dysrhythmia     Atrial Fibrillation  . Shortness of breath dyspnea   . Pneumonia     hx of  . Bipolar disorder ([place])     Takes Lamictal & Seroquel  . GERD (gastroesophageal reflux disease)   . Hyperlipemia   . Non-small cell carcinoma of lung, stage 1 ([place]) 02/24/2015     PAST SURGICAL HISTORY: Past Surgical History  Procedure Laterality Date  . Lumbar disc surgery      X 2  . Cardioversion N/A 02/26/2014    Procedure: CARDIOVERSION;  Surgeon: Thayer Headings, MD;  Location: [facility] ENDOSCOPY;  Service: Cardiovascular;  Laterality: N/A;  . Colonoscopy w/ polypectomy    . Upper gi endoscopy    . Thumb fusion Right   . Video bronchoscopy with endobronchial navigation N/A 01/29/2015    Procedure: VIDEO BRONCHOSCOPY WITH ENDOBRONCHIAL NAVIGATION;  Surgeon: Collene Gobble, MD;  Location: [place];  Service: Thoracic;  Laterality: N/A;  . Video bronchoscopy with endobronchial ultrasound N/A 01/29/2015    Procedure: VIDEO BRONCHOSCOPY WITH ENDOBRONCHIAL ULTRASOUND;  Surgeon: Collene Gobble, MD;  Location: [place];  Service: Thoracic;  Laterality: N/A;    FAMILY HISTORY: family history includes AAA (abdominal aortic aneurysm) in his mother; Cancer in his maternal aunt; Emphysema in his father; Heart attack in his father.  SOCIAL HISTORY:  Social History   Social History  . Marital Status: Divorced    Spouse Name: N/A  . Number of Children: N/A  . Years of Education: N/A   Occupational History  . Not on file.   Social History Main Topics  . Smoking status: Current Every Day Smoker -- 0.25 packs/day for 40 years    Types: Cigarettes  . Smokeless tobacco: Current User    Types: Chew     Comment: 4 Cigarettes per day/ counseled to quit.  . Alcohol Use: 2.4 oz/week    0 Glasses of wine, 4 Cans of beer, 0 Shots of liquor, 0 Standard drinks or equivalent  per week     Comment: 4 cans of beer/ day  . Drug Use: Yes     Comment: past use but, none recently  . Sexual Activity: No   Other Topics Concern  . Not on file   Social History Narrative    ALLERGIES: Review of patient's allergies indicates no known allergies.  MEDICATIONS:  Current Outpatient Prescriptions  Medication Sig Dispense Refill  . albuterol (PROVENTIL HFA;VENTOLIN HFA) 108 (90 BASE) MCG/ACT inhaler Inhale 2 puffs into the lungs every 6 (six) hours as needed for wheezing or shortness of breath. 1 Inhaler 6  . carvedilol (COREG) 12.5 MG tablet Take 1 tablet (12.5 mg total) by mouth 2 (two) times daily. 62 tablet 11  . lamoTRIgine (LAMICTAL) 200 MG tablet Take 200 mg by mouth daily at 12 noon.     Marland Kitchen losartan (COZAAR) 50 MG tablet Take 50 mg by mouth daily at 12 noon.     . pravastatin (PRAVACHOL) 20 MG tablet Take 20 mg by mouth daily at 12 noon.     . QUEtiapine (SEROQUEL) 100 MG tablet Take  100 mg by mouth at bedtime.     . RABEprazole (ACIPHEX) 20 MG tablet Take 20 mg by mouth daily at 12 noon.     Marland Kitchen Umeclidinium-Vilanterol (ANORO ELLIPTA) 62.5-25 MCG/INH AEPB Inhale 1 puff into the lungs daily. 60 each 5  . warfarin (COUMADIN) 5 MG tablet Take 0.5-1 tablets (2.5-5 mg total) by mouth daily at 12 noon. 2.1m on Monday, Wednesday and Friday.  556mall other days. 120 tablet 1   No current facility-administered medications for this encounter.    REVIEW OF SYSTEMS: On review of systems, the patient reports an overall he is doing very well. He continues to use oxygen requires 2-3 L at rest and up to 4-5 L with activity. He has been using oxygen for the last 2 years. He states that he is smoking 2 cigarettes to 3 cigarettes per day. He has been very pleased with his progress . He continues to have a cough that is productive in the morning that resolved without any intervention. He denies any hemoptysis. He denies any difficulty with unintended weight changes, chest pain, fevers or  chills. He denies any bowel or bladder dysfunction. A complete review of systems is obtained and is otherwise negative   PHYSICAL EXAM:  height is 6' (1.829 m) and weight is 229 lb 8 oz (104.101 kg). His oral temperature is 97.5 F (36.4 C). His blood pressure is 141/87 and his pulse is 70. His respiration is 18 and oxygen saturation is 95%.   Alert and oriented x3. Nasal cannula in place with 2L of oxygen. Pain scale 0/10 In general this is a well appearing Caucasian male in no acute distress. He is alert and oriented x4 and appropriate throughout the examination. HEENT reveals that the patient is normocephalic, atraumatic. EOMs are intact. PERRLA. Skin is intact without any evidence of gross lesions. Cardiovascular exam reveals a regular rate and rhythm, no clicks rubs or murmurs are auscultated. Chest is clear to auscultation bilaterally. Lymphatic assessment is performed and does not reveal any adenopathy in the cervical, supraclavicular, axillary, or inguinal chains. Abdomen has active bowel sounds in all quadrants and is intact. The abdomen is soft, non tender, non distended. Lower extremities are reveal trace pretibial pitting edema bilaterally. Dr. deep calf tenderness, cyanosis or clubbing.   KPS = 80  100 - Normal; no complaints; no evidence of disease. 90   - Able to carry on normal activity; minor signs or symptoms of disease. 80   - Normal activity with effort; some signs or symptoms of disease. 7029 - Cares for self; unable to carry on normal activity or to do active work. 60   - Requires occasional assistance, but is able to care for most of his personal needs. 50   - Requires considerable assistance and frequent medical care. 4089 - Disabled; requires special care and assistance. 3098 - Severely disabled; hospital admission is indicated although death not imminent. 2061 - Very sick; hospital admission necessary; active supportive treatment necessary. 10   - Moribund; fatal  processes progressing rapidly. 0     - Dead  Karnofsky DA, Abelmann WHOakhurstCraver LS and Burchenal JHThe Surgery [phone] use of the nitrogen mustards in the palliative treatment of carcinoma: with particular reference to bronchogenic carcinoma Cancer 1 634-56  LABORATORY DATA:  Lab Results  Component Value Date   WBC 8.4 02/24/2015   HGB 13.4 02/24/2015   HCT 38.2* 02/24/2015   MCV 99.0* 02/24/2015  PLT 251 02/24/2015   Lab Results  Component Value Date   NA 136 02/24/2015   K 4.4 02/24/2015   CL 102 01/23/2015   CO2 25 02/24/2015   Lab Results  Component Value Date   ALT 10 02/24/2015   AST 16 02/24/2015   ALKPHOS 89 02/24/2015   BILITOT 1.05 02/24/2015     RADIOGRAPHY: Dg Chest 1 View  02/13/2015  CLINICAL DATA:  Status post right lower lobe nodule biopsy EXAM: CHEST 1 VIEW COMPARISON:  01/29/2015 chest radiograph. FINDINGS: Stable cardiomediastinal silhouette with normal heart size. No pneumothorax. No pleural effusion. Known pulmonary nodules in the right mid lung and medial left upper lung appear stable. No pulmonary edema. No acute consolidative airspace disease. Mild bibasilar atelectasis. IMPRESSION: 1. No pneumothorax. 2. Mild bibasilar atelectasis. 3. Stable known pulmonary nodules in the right mid and medial left upper lungs. Electronically Signed   By: Ilona Sorrel M.D.   On: 02/13/2015 11:22   Mr Jeri Cos DJ Contrast  03/05/2015  CLINICAL DATA:  Personal history of lung cancer.  Gait disturbance. EXAM: MRI HEAD WITHOUT AND WITH CONTRAST TECHNIQUE: Multiplanar, multiecho pulse sequences of the brain and surrounding structures were obtained without and with intravenous contrast. CONTRAST:  44m MULTIHANCE GADOBENATE DIMEGLUMINE 529 MG/ML IV SOLN COMPARISON:  05/17/2008.  02/15/2008. FINDINGS: Diffusion imaging does not show any acute or subacute infarction. There chronic small-vessel ischemic changes affecting the pons and throughout the cerebral hemispheric deep and subcortical white  matter. No cortical or large vessel territory infarction. No evidence of primary or metastatic mass lesion, hemorrhage, hydrocephalus or extra-axial collection. No pituitary mass. No significant paranasal sinus disease. There is some fluid in the mastoid air cells on the left, probably not significant. No abnormal contrast enhancement occurs. No calvarial lesion. IMPRESSION: No evidence of metastatic disease.  No acute insult. Extensive chronic small vessel ischemic changes affecting the pons and the cerebral hemispheric white matter, only minimally progressive by imaging since 2010. Electronically Signed   By: MNelson ChimesM.D.   On: 03/05/2015 07:46   Ct Biopsy  02/13/2015  INDICATION: 65year old with bilateral suspicious lung nodules. Request for biopsy of the lesion in the superior segment of the right lower lobe. The patient is a smoker and uses home oxygen. EXAM: CT-GUIDED BIOPSY OF RIGHT LOWER LOBE LESION. MEDICATIONS: None. ANESTHESIA/SEDATION: Moderate (conscious) sedation was employed during this procedure. A total of Versed 1.0 mg and Fentanyl 50 mcg was administered intravenously. Moderate Sedation Time: 20 minutes. The patient's level of consciousness and vital signs were monitored continuously by radiology nursing throughout the procedure under my direct supervision. FLUOROSCOPY TIME:  None COMPLICATIONS: None immediate. PROCEDURE: Informed written consent was obtained from the patient after a thorough discussion of the procedural risks, benefits and alternatives. All questions were addressed. A timeout was performed prior to the initiation of the procedure. Patient was placed prone on the CT scanner. Images through the chest were obtained. The pleural-based lesion along the superior segment of the right lower lobe was identified. The skin was prepped with chlorhexidine and a sterile field was created. Using CT guidance, a 17 gauge needle was directed into the lesion and 2 core biopsies were  obtained with an 18 gauge core device. A [employer] tract sealant was deployed as the needle was removed. Bandage placed at the puncture site. FINDINGS: There is a 4.1 cm pleural-based lesion along the posterior aspect of the right lower lobe superior segment. Needle position was confirmed within the lesion. No  significant pneumothorax following the core biopsies. Patient has underlying emphysema. IMPRESSION: CT-guided core biopsies of right lower lobe lesion. Electronically Signed   By: Markus Daft M.D.   On: 02/13/2015 15:00      IMPRESSION: Stage IB (T2a, N0, M0) non-small cell lung cancer of the lower lobe of the right lung.   PLAN: We discussed that he has stage IB lung cancer feel that at this time he would be a good candidate for SBRT to both his RLL and LUL lesions, and that this type of treatment would provide a greater than 90% local control. Dr. Tammi Klippel discussed 5 treatments occurring every other day as an outpatient. We discussed these treatments will last about 10-20 minutes and he would be here at the hospital for about an hour. We discussed that SBRT was unlikely to make his breathing any worse. It is also unlikely to make his breathing symptoms any better. We discussed possible side effects including shoulder pain due to arm positioning and possible rib fracture/pain with one nodule's proximity to the ribs. We discussed damage to other critical normal structures including heart, ribs, lung collapse, chronic cough, and brachial plexus injury. We discussed that without treatment this  could develop into a more aggressive or even metastatic cancer. The patient signed a consent form and this was placed in his medical chart. At the end of the conversation, all questions were answered to the satisfaction of the patient. He is in agreement with this plan.  He is scheduled to follow up with Dr. Julien Nordmann tomorrow at [place] has been scheduled tomorrow at 10:30AM.  The above documentation  reflects my direct findings during this shared patient visit. Please see the separate note by Dr. Tammi Klippel on this date for the remainder of the patient's plan of care.  Carola Rhine, PAC  This document serves as a record of services personally performed by Shona Simpson, PAC, and Tyler Pita, MD. It was created on their behalf by Darcus Austin, a trained medical scribe. The creation of this record is based on the scribe's personal observations and the provider's statements to them. This document has been checked and approved by the attending provider.

## 2015-03-07 ENCOUNTER — Encounter: Payer: Self-pay | Admitting: Internal Medicine

## 2015-03-07 ENCOUNTER — Ambulatory Visit (HOSPITAL_BASED_OUTPATIENT_CLINIC_OR_DEPARTMENT_OTHER): Payer: BLUE CROSS/BLUE SHIELD | Admitting: Internal Medicine

## 2015-03-07 ENCOUNTER — Other Ambulatory Visit: Payer: Self-pay | Admitting: *Deleted

## 2015-03-07 ENCOUNTER — Ambulatory Visit
Admission: RE | Admit: 2015-03-07 | Discharge: 2015-03-07 | Disposition: A | Discharge status: Home / Self Care | Payer: BLUE CROSS/BLUE SHIELD | Source: Ambulatory Visit | Attending: Radiation Oncology | Admitting: Radiation Oncology

## 2015-03-07 ENCOUNTER — Telehealth: Payer: Self-pay | Admitting: Internal Medicine

## 2015-03-07 DIAGNOSIS — C3431 Malignant neoplasm of lower lobe, right bronchus or lung: Secondary | ICD-10-CM

## 2015-03-07 DIAGNOSIS — I714 Abdominal aortic aneurysm, without rupture, unspecified: Secondary | ICD-10-CM

## 2015-03-07 DIAGNOSIS — Z510 Encounter for antineoplastic radiation therapy: Secondary | ICD-10-CM | POA: Diagnosis not present

## 2015-03-07 DIAGNOSIS — C3491 Malignant neoplasm of unspecified part of right bronchus or lung: Secondary | ICD-10-CM

## 2015-03-07 HISTORY — DX: Abdominal aortic aneurysm, without rupture: I71.4

## 2015-03-07 HISTORY — DX: Abdominal aortic aneurysm, without rupture, unspecified: I71.40

## 2015-03-07 NOTE — Progress Notes (Signed)
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 The patient also has suspicious mediastinal lymphadenopathy as well as left upper lobe lung nodule concerning for synchronous primary lung cancer.  PRIOR THERAPY: None.  CURRENT THERAPY:Curative radiotherapy with stereotactic body radiotherapy to the right lower lobe lung mass and left upper lobe lung nodule under the care of Dr. Tammi Klippel.   INTERVAL HISTORY: Gerald Ho 65 y.o. male returns to the clinic today for follow-up visit. The patient is feeling fine today with no specific complaints except for the baseline shortness of breath and he is currently on oxygen. He denied having any significant chest pain but has mild cough with no hemoptysis. He has no nausea or vomiting, no fever or chills. He had MRI of the brain that showed no evidence for metastatic disease to the brain. Molecular biomarker for PDL 1 showed 0% expression. The patient was seen by Dr. Tammi Klippel and expected to start the stereotactic radiotherapy to the bilateral pulmonary nodules soon. He is in today for evaluation and discussion of his treatment options.  MEDICAL HISTORY: Past Medical History  Diagnosis Date  . Current smoker     2 packs per day  . High blood pressure   . Emphysema   . Dysrhythmia     Atrial Fibrillation  . Shortness of breath dyspnea   . Pneumonia     hx of  . Bipolar disorder ([place])     Takes Lamictal & Seroquel  . GERD (gastroesophageal reflux disease)   . Hyperlipemia   . Non-small cell carcinoma of lung, stage 1 ([place]) 02/24/2015    ALLERGIES:  has No Known Allergies.  MEDICATIONS:  Current Outpatient Prescriptions  Medication Sig Dispense Refill  .  albuterol (PROVENTIL HFA;VENTOLIN HFA) 108 (90 BASE) MCG/ACT inhaler Inhale 2 puffs into the lungs every 6 (six) hours as needed for wheezing or shortness of breath. 1 Inhaler 6  . carvedilol (COREG) 12.5 MG tablet Take 1 tablet (12.5 mg total) by mouth 2 (two) times daily. 62 tablet 11  . lamoTRIgine (LAMICTAL) 200 MG tablet Take 200 mg by mouth daily at 12 noon.     Marland Kitchen losartan (COZAAR) 50 MG tablet Take 50 mg by mouth daily at 12 noon.     . pravastatin (PRAVACHOL) 20 MG tablet Take 20 mg by mouth daily at 12 noon.     . QUEtiapine (SEROQUEL) 100 MG tablet Take 100 mg by mouth at bedtime.     . RABEprazole (ACIPHEX) 20 MG tablet Take 20 mg by mouth daily at 12 noon.     Marland Kitchen Umeclidinium-Vilanterol (ANORO ELLIPTA) 62.5-25 MCG/INH AEPB Inhale 1 puff into the lungs daily. 60 each 5  . warfarin (COUMADIN) 5 MG tablet Take 0.5-1 tablets (2.5-5 mg total) by mouth daily at 12 noon. 2.'5mg'$  on Monday, Wednesday and Friday.  '5mg'$  all other days. 120 tablet 1   No current facility-administered medications for this visit.    SURGICAL HISTORY:  Past Surgical History  Procedure Laterality Date  . Lumbar disc surgery      X 2  . Cardioversion N/A 02/26/2014    Procedure: CARDIOVERSION;  Surgeon: Thayer Headings, MD;  Location: [facility];  Service: Cardiovascular;  Laterality: N/A;  . Colonoscopy w/ polypectomy    . Upper gi endoscopy    . Thumb fusion Right   . Video bronchoscopy with endobronchial navigation N/A 01/29/2015    Procedure: VIDEO BRONCHOSCOPY WITH ENDOBRONCHIAL NAVIGATION;  Surgeon: Collene Gobble, MD;  Location: [facility];  Service: Thoracic;  Laterality: N/A;  . Video bronchoscopy with endobronchial ultrasound N/A 01/29/2015    Procedure: VIDEO BRONCHOSCOPY WITH ENDOBRONCHIAL ULTRASOUND;  Surgeon: Collene Gobble, MD;  Location: [facility];  Service: Thoracic;  Laterality: N/A;    REVIEW OF SYSTEMS:  Constitutional: negative Eyes: negative Ears, nose, mouth, throat, and face:  negative Respiratory: positive for cough and dyspnea on exertion Cardiovascular: negative Gastrointestinal: negative Genitourinary:negative Integument/breast: negative Hematologic/lymphatic: negative Musculoskeletal:negative Neurological: negative Behavioral/Psych: negative Endocrine: negative Allergic/Immunologic: negative   PHYSICAL EXAMINATION: General appearance: alert, cooperative, fatigued and no distress Head: Normocephalic, without obvious abnormality, atraumatic Neck: no adenopathy, no JVD, supple, symmetrical, trachea midline and thyroid not enlarged, symmetric, no tenderness/mass/nodules Lymph nodes: Cervical, supraclavicular, and axillary nodes normal. Resp: wheezes bilaterally Back: symmetric, no curvature. ROM normal. No CVA tenderness. Cardio: regular rate and rhythm, S1, S2 normal, no murmur, click, rub or gallop GI: soft, non-tender; bowel sounds normal; no masses,  no organomegaly Extremities: extremities normal, atraumatic, no cyanosis or edema Neurologic: Alert and oriented X 3, normal strength and tone. Normal symmetric reflexes. Normal coordination and gait  ECOG PERFORMANCE STATUS: 1 - Symptomatic but completely ambulatory  There were no vitals taken for this visit.  LABORATORY DATA: Lab Results  Component Value Date   WBC 8.4 02/24/2015   HGB 13.4 02/24/2015   HCT 38.2* 02/24/2015   MCV 99.0* 02/24/2015   PLT 251 02/24/2015      Chemistry      Component Value Date/Time   NA 136 02/24/2015 1500   NA 132* 01/23/2015 1451   K 4.4 02/24/2015 1500   K 4.2 01/23/2015 1451   CL 102 01/23/2015 1451   CO2 25 02/24/2015 1500   CO2 21* 01/23/2015 1451   BUN 10.4 02/24/2015 1500   BUN 12 01/23/2015 1451   CREATININE 1.3 02/24/2015 1500   CREATININE 1.31* 01/23/2015 1451   CREATININE 1.46* 10/29/2014 0942      Component Value Date/Time   CALCIUM 9.4 02/24/2015 1500   CALCIUM 9.1 01/23/2015 1451   ALKPHOS 89 02/24/2015 1500   ALKPHOS 67 01/23/2015  1451   AST 16 02/24/2015 1500   AST 17 01/23/2015 1451   ALT 10 02/24/2015 1500   ALT 13* 01/23/2015 1451   BILITOT 1.05 02/24/2015 1500   BILITOT 0.6 01/23/2015 1451       RADIOGRAPHIC STUDIES: Dg Chest 1 View  02/13/2015  CLINICAL DATA:  Status post right lower lobe nodule biopsy EXAM: CHEST 1 VIEW COMPARISON:  01/29/2015 chest radiograph. FINDINGS: Stable cardiomediastinal silhouette with normal heart size. No pneumothorax. No pleural effusion. Known pulmonary nodules in the right mid lung and medial left upper lung appear stable. No pulmonary edema. No acute consolidative airspace disease. Mild bibasilar atelectasis. IMPRESSION: 1. No pneumothorax. 2. Mild bibasilar atelectasis. 3. Stable known pulmonary nodules in the right mid and medial left upper lungs. Electronically Signed   By: Ilona Sorrel M.D.   On: 02/13/2015 11:22   Mr Jeri Cos IF Contrast  03/05/2015  CLINICAL DATA:  Personal history of lung cancer.  Gait disturbance. EXAM: MRI HEAD WITHOUT AND WITH CONTRAST TECHNIQUE: Multiplanar, multiecho pulse sequences of the brain and surrounding structures were obtained without and with  intravenous contrast. CONTRAST:  66m MULTIHANCE GADOBENATE DIMEGLUMINE 529 MG/ML IV SOLN COMPARISON:  05/17/2008.  02/15/2008. FINDINGS: Diffusion imaging does not show any acute or subacute infarction. There chronic small-vessel ischemic changes affecting the pons and throughout the cerebral hemispheric deep and subcortical white matter. No cortical or large vessel territory infarction. No evidence of primary or metastatic mass lesion, hemorrhage, hydrocephalus or extra-axial collection. No pituitary mass. No significant paranasal sinus disease. There is some fluid in the mastoid air cells on the left, probably not significant. No abnormal contrast enhancement occurs. No calvarial lesion. IMPRESSION: No evidence of metastatic disease.  No acute insult. Extensive chronic small vessel ischemic changes affecting the  pons and the cerebral hemispheric white matter, only minimally progressive by imaging since 2010. Electronically Signed   By: MNelson ChimesM.D.   On: 03/05/2015 07:46   Ct Biopsy  02/13/2015  INDICATION: 65year old with bilateral suspicious lung nodules. Request for biopsy of the lesion in the superior segment of the right lower lobe. The patient is a smoker and uses home oxygen. EXAM: CT-GUIDED BIOPSY OF RIGHT LOWER LOBE LESION. MEDICATIONS: None. ANESTHESIA/SEDATION: Moderate (conscious) sedation was employed during this procedure. A total of Versed 1.0 mg and Fentanyl 50 mcg was administered intravenously. Moderate Sedation Time: 20 minutes. The patient's level of consciousness and vital signs were monitored continuously by radiology nursing throughout the procedure under my direct supervision. FLUOROSCOPY TIME:  None COMPLICATIONS: None immediate. PROCEDURE: Informed written consent was obtained from the patient after a thorough discussion of the procedural risks, benefits and alternatives. All questions were addressed. A timeout was performed prior to the initiation of the procedure. Patient was placed prone on the CT scanner. Images through the chest were obtained. The pleural-based lesion along the superior segment of the right lower lobe was identified. The skin was prepped with chlorhexidine and a sterile field was created. Using CT guidance, a 17 gauge needle was directed into the lesion and 2 core biopsies were obtained with an 18 gauge core device. A [employer] tract sealant was deployed as the needle was removed. Bandage placed at the puncture site. FINDINGS: There is a 4.1 cm pleural-based lesion along the posterior aspect of the right lower lobe superior segment. Needle position was confirmed within the lesion. No significant pneumothorax following the core biopsies. Patient has underlying emphysema. IMPRESSION: CT-guided core biopsies of right lower lobe lesion. Electronically Signed   By: AMarkus DaftM.D.   On: 02/13/2015 15:00    ASSESSMENT AND PLAN: This is a very pleasant 65years old white male with synchronous primary lung cancer including a stage IB in the right lower lobe and questionable stage IA in the left upper lobe diagnosed in February 2017. The patient has no evidence of metastatic disease to the brain. He has questionable right hilar lymphadenopathy that need close observation. PDL 1 expression is negative and the patient would not benefit from frontline immune therapy based on these results. I recommended for him to proceed with the stereotactic body radiotherapy as recommended by Dr. MTammi Klippel I will monitor him closely after completion of the radiotherapy with repeat CT scan of the chest in 3 months. The patient also has abdominal aortic aneurysm that will need close monitoring in the future. He was advised to call immediately if he has any concerning symptoms in the interval. The patient voices understanding of current disease status and treatment options and is in agreement with the current care plan.  All questions were answered. The patient  knows to call the clinic with any problems, questions or concerns. We can certainly see the patient much sooner if necessary.  Disclaimer: This note was dictated with voice recognition software. Similar sounding words can inadvertently be transcribed and may not be corrected upon review.

## 2015-03-07 NOTE — Telephone Encounter (Signed)
Gave and printed appt sched and avs for pt for June °

## 2015-03-07 NOTE — Progress Notes (Signed)
Radiation Oncology         ([phone]) [phone] ________________________________  Name: Gerald Ho MRN: [id number]  Date: 03/07/2015  DOB: 04-10-1950  STEREOTACTIC BODY RADIOTHERAPY SIMULATION AND TREATMENT PLANNING NOTE    ICD-9-CM ICD-10-CM   1. Cancer of lower lobe of right lung ([place]) 162.5 C34.31     DIAGNOSIS:  65 y.o. gentleman with Stage IB (T2a, N0, M0) non-small cell lung cancer of the lower lobe of the right lung and presumed synchronous primary T1aN0M0 Stage IA non-small carcioma of the left upper lung  NARRATIVE:  The patient was brought to the [facility].  Identity was confirmed.  All relevant records and images related to the planned course of therapy were reviewed.  The patient freely provided informed written consent to proceed with treatment after reviewing the details related to the planned course of therapy. The consent form was witnessed and verified by the simulation staff.  Then, the patient was set-up in a stable reproducible  supine position for radiation therapy.  A BodyFix immobilization pillow was fabricated for reproducible positioning.  Then I personally applied the abdominal compression paddle to limit respiratory excursion.  4D respiratoy motion management CT images were obtained.  Surface markings were placed.  The CT images were loaded into the planning software.  Then, using Cine, MIP, and standard views, the internal target volume (ITV) and planning target volumes (PTV) were delinieated, and avoidance structures were contoured.  Treatment planning then occurred.  The radiation prescription was entered and confirmed.  A total of two complex treatment devices were fabricated in the form of the BodyFix immobilization pillow and a neck accuform cushion.  I have requested : 3D Simulation  I have requested a DVH of the following structures: Heart, Lungs, Esophagus, Chest Wall, Brachial Plexus, Major Blood Vessels, and targets.  SPECIAL TREATMENT PROCEDURE:   The planned course of therapy using radiation constitutes a special treatment procedure. Special care is required in the management of this patient for the following reasons. This treatment constitutes a Special Treatment Procedure for the following reason: [ High dose per fraction requiring special monitoring for increased toxicities of treatment including daily imaging..  The special nature of the planned course of radiotherapy will require increased physician supervision and oversight to ensure patient's safety with optimal treatment outcomes.  RESPIRATORY MOTION MANAGEMENT SIMULATION:  In order to account for effect of respiratory motion on target structures and other organs in the planning and delivery of radiotherapy, this patient underwent respiratory motion management simulation.  To accomplish this, when the patient was brought to the CT simulation planning suite, 4D respiratoy motion management CT images were obtained.  The CT images were loaded into the planning software.  Then, using a variety of tools including Cine, MIP, and standard views, the target volume and planning target volumes (PTV) were delineated.  Avoidance structures were contoured.  Treatment planning then occurred.  Dose volume histograms were generated and reviewed for each of the requested structure.  The resulting plan was carefully reviewed and approved today.  PLAN:  The patient will receive 60 Gy in 5 fractions to the right lower lung and 54 Gy in 3 fractions to the left upper lung.  ________________________________  Sheral Apley. Tammi Klippel, M.D.     This document serves as a record of services personally performed by Tyler Pita, MD. It was created on his behalf by Lendon Collar, a trained medical scribe. The creation of this record is based on the scribe's personal observations  and the provider's statements to them. This document has been checked and approved by the attending provider.

## 2015-03-14 LAB — AFB CULTURE WITH SMEAR (NOT AT ARMC)
ACID FAST SMEAR: NONE SEEN
Acid Fast Smear: NONE SEEN

## 2015-03-19 DIAGNOSIS — Z510 Encounter for antineoplastic radiation therapy: Secondary | ICD-10-CM | POA: Diagnosis not present

## 2015-03-20 ENCOUNTER — Ambulatory Visit
Admission: RE | Admit: 2015-03-20 | Discharge: 2015-03-20 | Disposition: A | Discharge status: Home / Self Care | Payer: BLUE CROSS/BLUE SHIELD | Source: Ambulatory Visit | Attending: Radiation Oncology | Admitting: Radiation Oncology

## 2015-03-20 DIAGNOSIS — Z510 Encounter for antineoplastic radiation therapy: Secondary | ICD-10-CM | POA: Diagnosis not present

## 2015-03-21 ENCOUNTER — Ambulatory Visit: Payer: BLUE CROSS/BLUE SHIELD

## 2015-03-24 ENCOUNTER — Ambulatory Visit
Admission: RE | Admit: 2015-03-24 | Discharge: 2015-03-24 | Disposition: A | Discharge status: Home / Self Care | Payer: BLUE CROSS/BLUE SHIELD | Source: Ambulatory Visit | Attending: Radiation Oncology | Admitting: Radiation Oncology

## 2015-03-24 DIAGNOSIS — Z510 Encounter for antineoplastic radiation therapy: Secondary | ICD-10-CM | POA: Diagnosis not present

## 2015-03-25 ENCOUNTER — Ambulatory Visit (INDEPENDENT_AMBULATORY_CARE_PROVIDER_SITE_OTHER): Payer: BLUE CROSS/BLUE SHIELD | Admitting: *Deleted

## 2015-03-25 DIAGNOSIS — Z5181 Encounter for therapeutic drug level monitoring: Secondary | ICD-10-CM

## 2015-03-25 DIAGNOSIS — I4891 Unspecified atrial fibrillation: Secondary | ICD-10-CM | POA: Diagnosis not present

## 2015-03-25 LAB — POCT INR: INR: 3.2

## 2015-03-25 MED ORDER — RIVAROXABAN 20 MG PO TABS
20.0000 mg | ORAL_TABLET | Freq: Every day | ORAL | Status: DC
Start: 2015-03-25 — End: 2015-08-22

## 2015-03-26 ENCOUNTER — Telehealth: Payer: Self-pay | Admitting: *Deleted

## 2015-03-26 ENCOUNTER — Ambulatory Visit
Admission: RE | Admit: 2015-03-26 | Discharge: 2015-03-26 | Disposition: A | Discharge status: Home / Self Care | Payer: BLUE CROSS/BLUE SHIELD | Source: Ambulatory Visit | Attending: Radiation Oncology | Admitting: Radiation Oncology

## 2015-03-26 DIAGNOSIS — Z510 Encounter for antineoplastic radiation therapy: Secondary | ICD-10-CM | POA: Diagnosis not present

## 2015-03-26 NOTE — Telephone Encounter (Signed)
Oncology Nurse Navigator Documentation  Oncology Nurse Navigator Flowsheets 03/26/2015  Navigator Location [facility]  Navigator Encounter Type Telephone  Telephone Outgoing Call  Abnormal Finding Date 12/26/2014  Confirmed Diagnosis Date 02/13/2015  Treatment Initiated Date 03/07/2015  Patient Visit Type Follow-up  Treatment Phase Treatment  Barriers/Navigation Needs No barriers at this time  Interventions None required  Acuity Level 1  Acuity Level 1 Minimal follow up required  Time Spent with Patient 15   I called today to follow up with Mr. Capshaw.  He is doing well.  I asked if he had any side effects of treatment.  He stated no.  He stated he is doing well.  No barriers identified at this time.

## 2015-03-27 ENCOUNTER — Ambulatory Visit: Payer: BLUE CROSS/BLUE SHIELD | Admitting: Radiation Oncology

## 2015-03-27 ENCOUNTER — Telehealth: Payer: Self-pay | Admitting: *Deleted

## 2015-03-27 ENCOUNTER — Ambulatory Visit: Payer: Self-pay | Admitting: Cardiovascular Disease

## 2015-03-27 DIAGNOSIS — Z5181 Encounter for therapeutic drug level monitoring: Secondary | ICD-10-CM

## 2015-03-27 DIAGNOSIS — I4891 Unspecified atrial fibrillation: Secondary | ICD-10-CM

## 2015-03-27 NOTE — Telephone Encounter (Signed)
-----   Message from Thayer Headings, MD sent at 03/26/2015  5:15 PM EDT ----- Sounds great Thanks  ----- Message -----    From: Marcos Eke, RN    Sent: 03/25/2015   4:51 PM      To: Thayer Headings, MD  Dr. Acie Fredrickson,  We will manage in our [place].  After receiving your message I spoke to the pt and gave him instructions.  He will hold his dose of Coumadin today to allow INR to drop below 3.0. Also instructed him to start Xarelto '20mg'$  daily on Wednesday 03/26/15 at supper.  He will come into the Coumadin Clinic in 1 month for follow-up labs.   Thank you  Derrel Nip, RN    ----- Message -----    From: Thayer Headings, MD    Sent: 03/25/2015   3:15 PM      To: Aris Georgia, RPH, Sulaiman Imbert B Serapio Edelson, RN  We have tried him on Eliquis in the past but he went back to coumadin due to cost. Xarelto 20 mg a day will be fine. He may start Xarelto when his INR is < 3.0. It was 3.2 today .  Would you like for [place] to manage this or will you do it in [place]?  Phil Nahser     ----- Message -----    From: Marcos Eke, RN    Sent: 03/25/2015  12:53 PM      To: Thayer Headings, MD, Emmaline Life, RN  Dr. Acie Fredrickson, The patient was seen in the office on today for an INR check & he stated that he wanted to switch to a NOAC pending your approval. He states that he would like to switch to Xarelto because it will be affordable at this time. He stated he has never been on anything but Coumadin & stated when he first begin Coumadin he has the option of starting a NOAC but they were too expensive at that time. Please advise & Thanks.

## 2015-03-27 NOTE — Telephone Encounter (Signed)
Pleas note that pt has switched from Coumadin to Xarelto on 03/26/15.

## 2015-03-28 ENCOUNTER — Ambulatory Visit
Admission: RE | Admit: 2015-03-28 | Discharge: 2015-03-28 | Disposition: A | Discharge status: Home / Self Care | Payer: BLUE CROSS/BLUE SHIELD | Source: Ambulatory Visit | Attending: Radiation Oncology | Admitting: Radiation Oncology

## 2015-03-28 DIAGNOSIS — Z510 Encounter for antineoplastic radiation therapy: Secondary | ICD-10-CM | POA: Diagnosis not present

## 2015-03-31 ENCOUNTER — Ambulatory Visit: Admission: RE | Admit: 2015-03-31 | Payer: BLUE CROSS/BLUE SHIELD | Source: Ambulatory Visit

## 2015-03-31 ENCOUNTER — Encounter: Payer: Self-pay | Admitting: Adult Health

## 2015-03-31 ENCOUNTER — Encounter: Payer: Self-pay | Admitting: Radiation Oncology

## 2015-03-31 ENCOUNTER — Ambulatory Visit
Admission: RE | Admit: 2015-03-31 | Discharge: 2015-03-31 | Disposition: A | Discharge status: Home / Self Care | Payer: BLUE CROSS/BLUE SHIELD | Source: Ambulatory Visit | Attending: Radiation Oncology | Admitting: Radiation Oncology

## 2015-03-31 VITALS — BP 117/71 | HR 86 | Resp 18 | Wt 215.7 lb

## 2015-03-31 DIAGNOSIS — C3431 Malignant neoplasm of lower lobe, right bronchus or lung: Secondary | ICD-10-CM

## 2015-03-31 NOTE — Progress Notes (Signed)
I briefly met Gerald Ho prior to his final SBRT lung radiation treatment today.  I shared with him the purpose of survivorship and my role in his care. We discussed having him see me in the survivorship clinic on 05/06/15 after his visit with Shona Simpson, PA to review his survivorship care plan document and ensure any acute side effects of treatment are managed.  He voiced understanding and agreement.  I gave him a copy of the "Life After Cancer for Every Survivor" booklet today, along with my business card to contact me with any questions.  I wrote his survivorship appt date and time down for him on the card.  I look forward to participating in his care!  Mike Craze, NP [place] [phone]

## 2015-03-31 NOTE — Progress Notes (Signed)
  Radiation Oncology         [phone]   Name: Gerald Ho MRN: [id number]   Date: 03/31/2015  DOB: [birth year]   Weekly Radiation Therapy Management    ICD-9-CM ICD-10-CM   1. Non-small cell carcinoma of lung, stage 1 ([facility]) 162.5 C34.31     Current Dose: 50 Gy  Planned Dose: 60  Gy  Narrative The patient presents for routine under treatment assessment.  Reports a rare productive cough with clear sputum. Denies hemoptysis. Denies pain or difficulty associated with swallowing. Reports SOB continues. No skin changes noted within treatment field. Report he smokes on average 1-2 cigarettes per day. Seen by Mike Craze, NP earlier about survivorship. One month follow up appointment card was given by the nurse.  The chart was checked.  Physical Findings  weight is 215 lb 11.2 oz (97.841 kg). His blood pressure is 117/71 and his pulse is 86. His respiration is 18 and oxygen saturation is 82%.   The patient is on 2 liters of continuous oxygen therapy.  Impression The patient is tolerating radiation.  Plan Continue treatment as planned.     Sheral Apley Tammi Klippel, M.D.  This document serves as a record of services personally performed by Tyler Pita, MD. It was created on his behalf by Darcus Austin, a trained medical scribe. The creation of this record is based on the scribe's personal observations and the provider's statements to them. This document has been checked and approved by the attending provider.

## 2015-03-31 NOTE — Progress Notes (Signed)
Weight stable. Pulse ox 79-82% on 2 liters of continuous oxygen therapy. Reports a rare productive cough with clear sputum. Denies hemoptysis. Denies pain or difficulty associated with swallowing. Reports SOB continues. No skin changes noted within treatment field. Report he smokes on average 1-2 cigarettes per day. Seen by Mike Craze, NP about survivorship. One month follow up appointment card given.   BP 117/71 mmHg  Pulse 86  Resp 18  Wt 215 lb 11.2 oz (97.841 kg)  SpO2 82% Wt Readings from Last 3 Encounters:  03/31/15 215 lb 11.2 oz (97.841 kg)  03/06/15 229 lb 8 oz (104.101 kg)  02/24/15 228 lb 6.4 oz (103.602 kg)

## 2015-04-01 ENCOUNTER — Ambulatory Visit: Admission: RE | Admit: 2015-04-01 | Payer: BLUE CROSS/BLUE SHIELD | Source: Ambulatory Visit

## 2015-04-01 ENCOUNTER — Ambulatory Visit
Admission: RE | Admit: 2015-04-01 | Discharge: 2015-04-01 | Disposition: A | Discharge status: Home / Self Care | Payer: BLUE CROSS/BLUE SHIELD | Source: Ambulatory Visit | Attending: Radiation Oncology | Admitting: Radiation Oncology

## 2015-04-04 ENCOUNTER — Ambulatory Visit
Admission: RE | Admit: 2015-04-04 | Discharge: 2015-04-04 | Disposition: A | Discharge status: Home / Self Care | Payer: BLUE CROSS/BLUE SHIELD | Source: Ambulatory Visit | Attending: Radiation Oncology | Admitting: Radiation Oncology

## 2015-04-04 ENCOUNTER — Encounter: Payer: Self-pay | Admitting: Emergency Medicine

## 2015-04-04 ENCOUNTER — Ambulatory Visit (INDEPENDENT_AMBULATORY_CARE_PROVIDER_SITE_OTHER)
Admission: RE | Admit: 2015-04-04 | Discharge: 2015-04-04 | Disposition: A | Discharge status: Home / Self Care | Payer: BLUE CROSS/BLUE SHIELD | Source: Ambulatory Visit | Attending: Emergency Medicine | Admitting: Emergency Medicine

## 2015-04-04 ENCOUNTER — Ambulatory Visit (INDEPENDENT_AMBULATORY_CARE_PROVIDER_SITE_OTHER): Payer: BLUE CROSS/BLUE SHIELD | Admitting: Emergency Medicine

## 2015-04-04 ENCOUNTER — Encounter: Payer: Self-pay | Admitting: Radiation Oncology

## 2015-04-04 VITALS — BP 140/80 | HR 100 | Ht 72.0 in | Wt 228.0 lb

## 2015-04-04 DIAGNOSIS — R0600 Dyspnea, unspecified: Secondary | ICD-10-CM | POA: Diagnosis not present

## 2015-04-04 DIAGNOSIS — Z510 Encounter for antineoplastic radiation therapy: Secondary | ICD-10-CM | POA: Diagnosis not present

## 2015-04-04 DIAGNOSIS — J441 Chronic obstructive pulmonary disease with (acute) exacerbation: Secondary | ICD-10-CM

## 2015-04-04 DIAGNOSIS — Z720 Tobacco use: Secondary | ICD-10-CM | POA: Diagnosis not present

## 2015-04-04 DIAGNOSIS — J449 Chronic obstructive pulmonary disease, unspecified: Secondary | ICD-10-CM

## 2015-04-04 MED ORDER — LEVOFLOXACIN 500 MG PO TABS: 500.0000 mg | ORAL_TABLET | Freq: Every day | ORAL | Status: DC

## 2015-04-04 MED ORDER — PREDNISONE 10 MG PO TABS: ORAL_TABLET | ORAL | Status: DC

## 2015-04-04 NOTE — Progress Notes (Signed)
HPI:  65 yo smoker (78 pk-yrs), hx HTN, CHF, COPD dx in 2000. Prior admission for beer potomania and hyponatremia. Underwent screening LDCT on 10/06/12 for high risk pt. He has exertional dyspnea. He is using symbicort and spiriva depending on when he can get sample and how much work he is doing. His LDCT shows a 1.3cm LUL spiculated nodule. Here to discuss the abnormal CT.   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 No known hx TB exposure.   ROV 01/12/13 -- follows up for dyspnea/COPD, LUL nodule. PET performed 1/8 shows the LUL nodule is unchanged in size and is not hypermetabolic on PET. He was desaturated on walking into the office today. Still has cough prod of white. Worst when he lays down flat. He is on spiriva and symbicort > takes them when he able to afford; sounds like he is using symbicort more regularly than spiriva but neither of them reliably. He is hypertensive today. Medication compliance and cost is a huge issue here. He used to have oxygen but couldn't afford copay.   ROV 09/04/13 -- follows for her COPD and a left upper lobe nodule, negative on PET. Needs repeat CT scan 10/'15.  He is having trouble with a L parotid swelling and pain. He was treated with abx with some improvement. He is outside some, gardening. He is on spiriva, sometimes skips it. He also decreases his symbicort to qd. He is smoking 1pk/day. Not interested in quitting.   ROV 12/05/13 -- follows for his COPD, Also with a LUL nodule that we have followed on LDCT. Most recent scan 12/03/13 showed the LUL nodule was smaller, there is stable to slightly larger nodule in the RLL. He is having more exertional SOB. Last time we changed spiriva and symbicort to Anoro. He is desaturated on RA today.   ROV 03/22/14 -- follow up visit for tobacco, pulmonary nodules  (on LDCT), tobacco abuse. Presents today reporting that he has been doing well. Still smoking only 3 cig a day.  No flares, but he does state that he gets SOB with  significant exertion like yard work. He has a portable concentrator that is on 3L/min w exertion. He remains on Anoro but has not been taking every day.   ROV 08/02/14 -- follow-up visit for tobacco use, COPD. He has old inflammatory change and pulmonary nodules that we are following through the low-dose CT scan screening program. His most recent scan was performed on 06/06/14 and I reviewed this scan personally. It shows overall stability of the pulmonary nodules in question with exception of some increased GGI .   He is on 2-3L/min O2 via POC. He is scheduled for repeat scan end of the year. Smoking 3 cig a day.  Uses vapor and chewing tobacco to supplement. Remains on Anoro. Minimal cough. Able to exert some by limited by SOB. Recovers quickly w rest.   ROV1/27/17 -- Gerald Ho  has a history of tobacco use and COPD as well as slowly enlarging pulmonary nodules on CT scan of the chest. He underwent navigational bronchoscopy on 01/29/15 2 biopsy a small speculated left upper lobe nodule as well as a larger more diffuse right lower lobe nodule both of which were hypermetabolic on PET scan that was performed on 12/26/14. I reviewed his cytology results with the pathologist today, and special stains are still being performed. I expect that there will be a final report out next Monday.  He coughed up an old clot  last night, hasn't seen any other blood. He has had some R low back pain since his procedure. He is having more exertional SOB over the last several months. He has gained 35 lbs in 18 months. He is on Anoro.   ROV 04/04/15 -- follow-up visit for COPD and newly diagnosed squamous cell lung cancer. He is currently managed on Anoro. He's been followed by Dr. Julien Nordmann and by Dr Tammi Klippel, he has been receiving sterotactic XRT (on his 5th treatment today). He states that for the last 7-10 days he has had more exertional SOB, more hypoxemia > currently on 4-5L/min, wears 3.5L/min at home.  More cough over the last 3  days. Does not hear any wheezing. He is smoking 1 cigarette a day.     Filed Vitals:   04/04/15 1535  BP: 140/80  Pulse: 100  Height: 6' (1.829 m)  Weight: 228 lb (103.42 kg)  SpO2: 90%   Gen: Pleasant, well-nourished, in no distress,  normal affect  ENT: No lesions,  mouth clear,  oropharynx clear, no postnasal drip  Neck: No JVD, no TMG, no carotid bruits  Lungs: No use of accessory muscles, distant, clear without rales or rhonchi  Cardiovascular: RRR, heart sounds normal, no murmur or gallops, no peripheral edema  Musculoskeletal: No deformities, no cyanosis or clubbing  Neuro: alert, non focal  Skin: Warm, no lesions or rashes  06/05/24 -- LDCT COMPARISON: Low dose lung cancer screening CT 12/03/2013.  FINDINGS: Mediastinum/Lymph Nodes: Heart size is normal. There is no significant pericardial fluid, thickening or pericardial calcification. There is atherosclerosis of the thoracic aorta, the great vessels of the mediastinum and the coronary arteries, including calcified atherosclerotic plaque in the left main, left anterior descending, left circumflex and right coronary arteries. Borderline to mildly enlarged mediastinal lymph nodes measuring up to 13 mm in short axis in the low right paratracheal nodal station, slightly decreased compared to the prior examination. Esophagus is unremarkable in appearance. No axillary lymphadenopathy.  Lungs/Pleura: Again noted are multiple lesions throughout the lungs bilaterally, some of which are small solid nodules while others are ground-glass attenuation or mixed solid and subsolid nodules. The majority of these lesions are stable in size and appearance compared to the prior examinations. The exception to this is a mixed solid and subsolid lesion in the superior segment of the left lower lobe (image 130 of series 5), which has a peripheral ground-glass attenuation component measuring 12.9 mm in mean diameter, with  a central solid component measuring 9.9 mm in diameter (volumetrically derived), which is slightly larger than prior examination 12/03/2013 (at which point this nodule had actually decreased in size compared to the more remote prior examination from 10/05/2012). Overall, this is still favored to represent an area of post infectious or inflammatory scarring, particularly given the lack of hypermetabolism on prior PET-CT 01/11/2013. Additionally, in the posterior aspect of the superior segment of the right lower lobe (image 212 of series 5) there is a very ill-defined lesion that is predominantly ground-glass attenuation, which is difficult to accurately measure using standard or volumetric techniques, but appears grossly similar to the most recent prior examination from 12/03/2013, and is also favored to be post infectious or inflammatory in etiology. Mild diffuse bronchial wall thickening. Moderate centrilobular and mild paraseptal emphysema.  Upper Abdomen: Extensive atherosclerosis. Otherwise, unremarkable.  Musculoskeletal/Soft Tissues: Old compression fractures at T6 and L1, most severe at L1 where there is 30% loss of anterior vertebral body height, similar to prior examinations. There are  no aggressive appearing lytic or blastic lesions noted in the visualized portions of the skeleton.  IMPRESSION: 1. Lung-Rads category 3S, probably benign findings. Overall, today's study is very complicated, as detailed above, however, the lesions in the posterior left upper lobe and superior segment of the right lower lobe are both favored to be post infectious or inflammatory areas of scarring based on their behavior over the preceding examinations dating back to 10/05/2012. Short-term follow-up in 6 months is recommended with repeat low-dose chest CT without contrast (please use the following order, "CT CHEST LUNG CA SCREEN LOW DOSE W/O CM"). This was discussed by phone on 06/06/2014  with Dr. Lamonte Sakai by Dr. Weber Cooks at 5:15 p.m. 2. The "S" modifier above refers to potentially clinically significant non lung cancer related findings. Specifically, there is extensive atherosclerosis, including left main and 3 vessel coronary artery disease. Please note that although the presence of coronary artery calcium documents the presence of coronary artery disease, the severity of this disease and any potential stenosis cannot be assessed on this non-gated CT examination. Assessment for potential risk factor modification, dietary therapy or pharmacologic therapy may be warranted, if clinically indicated. 3. Mild diffuse bronchial thickening with moderate centrilobular and mild paraseptal emphysema; imaging findings suggestive of underlying COPD. Counseling for smoking cessation is strongly recommended.   COPD exacerbation (HCC) Acute flare in setting probable URI.  pred taper + levaquin   Tobacco abuse disorder Discussed cessation  Dyspnea I suspect that his current dyspnea is related to an exacerbation of COPD and we will treat him such. I also think he needs a chest x-ray to evaluate and rule out possible radiation pneumonitis given that he just finished 5 treatments  COPD (chronic obstructive pulmonary disease) Continue Anoro + albuterol prn

## 2015-04-04 NOTE — Assessment & Plan Note (Signed)
Acute flare in setting probable URI.  pred taper + levaquin

## 2015-04-04 NOTE — Addendum Note (Signed)
Addended by: Desmond Dike C on: 04/04/2015 04:07 PM   Modules accepted: Orders, SmartSet

## 2015-04-04 NOTE — Assessment & Plan Note (Signed)
Discussed cessation 

## 2015-04-04 NOTE — Assessment & Plan Note (Signed)
Continue Anoro + albuterol prn

## 2015-04-04 NOTE — Patient Instructions (Signed)
Please take levaquin '500mg'$  daily for 7 days Please take prednisone as directed Continue your oxygen at 4L/min at rest, increase to 5L/min with exertion.  Continue Anoro daily We will re-order your albuterol since Pro-Air is not on your formulary CXR today Follow with Dr Lamonte Sakai in 3 months or sooner if you have any problems.

## 2015-04-04 NOTE — Assessment & Plan Note (Signed)
I suspect that his current dyspnea is related to an exacerbation of COPD and we will treat him such. I also think he needs a chest x-ray to evaluate and rule out possible radiation pneumonitis given that he just finished 5 treatments

## 2015-04-14 ENCOUNTER — Telehealth: Payer: Self-pay | Admitting: Emergency Medicine

## 2015-04-14 NOTE — Telephone Encounter (Signed)
Pt has returned call - [phone]

## 2015-04-14 NOTE — Telephone Encounter (Signed)
Spoke with pt. He is aware that we have received this form and the RB is not back in the office until next week. This form is not something that is time sensitive. Pt would like this form faxed back to him once RB signs this. Will route message to myself to follow up on.

## 2015-04-14 NOTE — Telephone Encounter (Signed)
lmtcb x1 for pt. Form has been received and is in RB's look at.

## 2015-04-21 NOTE — Progress Notes (Signed)
  Radiation Oncology         [phone] ________________________________  Name: [name] MRN: [id number]  Date: 04/04/2015  DOB: [birth year]  End of Treatment Note  ICD-9-CM ICD-10-CM     1. Cancer of lower lobe of right lung ([place]) 162.5 C34.31     DIAGNOSIS: 65 y.o. 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  The target in the LUL was treated to 54 Gy in 3 fractions of 18 Gy 2.  The target in the RLL was treated to 60 Gy in 5 fractions of 12 Gy  Beams/energy:    1. and 2. The patient was treated using stereotactic body radiotherapy according to a 3D conformal radiotherapy plan.  Volumetric arc fields were employed to deliver 6 MV X-rays.  Image guidance was performed with per fraction cone beam CT prior to treatment under personal MD supervision.  Immobilization was achieved using BodyFix Pillow.  Narrative: The patient tolerated radiation treatment relatively well.   Plan: The patient has completed radiation treatment. The patient will return to radiation oncology clinic for routine followup in one month. I advised them to call or return sooner if they have any questions or concerns related to their recovery or treatment. ________________________________  Sheral Apley. Tammi Klippel, M.D.

## 2015-04-21 NOTE — Telephone Encounter (Signed)
Form has been signed by RB. It has been placed in the outgoing mail per the pt's request. Pt is aware that this has been done.

## 2015-04-29 ENCOUNTER — Ambulatory Visit (INDEPENDENT_AMBULATORY_CARE_PROVIDER_SITE_OTHER): Payer: BLUE CROSS/BLUE SHIELD | Admitting: Cardiovascular Disease

## 2015-04-29 ENCOUNTER — Encounter: Payer: Self-pay | Admitting: Cardiovascular Disease

## 2015-04-29 ENCOUNTER — Ambulatory Visit (INDEPENDENT_AMBULATORY_CARE_PROVIDER_SITE_OTHER): Payer: BLUE CROSS/BLUE SHIELD

## 2015-04-29 VITALS — BP 90/64 | HR 78 | Ht 72.0 in | Wt 229.0 lb

## 2015-04-29 DIAGNOSIS — I5022 Chronic systolic (congestive) heart failure: Secondary | ICD-10-CM

## 2015-04-29 DIAGNOSIS — I4891 Unspecified atrial fibrillation: Secondary | ICD-10-CM

## 2015-04-29 DIAGNOSIS — Z5181 Encounter for therapeutic drug level monitoring: Secondary | ICD-10-CM

## 2015-04-29 LAB — CBC
HCT: 35.8 % — ABNORMAL LOW (ref 38.5–50.0)
Hemoglobin: 12.2 g/dL — ABNORMAL LOW (ref 13.2–17.1)
MCH: 33.1 pg — ABNORMAL HIGH (ref 27.0–33.0)
MCHC: 34.1 g/dL (ref 32.0–36.0)
MCV: 97 fL (ref 80.0–100.0)
MPV: 9.5 fL (ref 7.5–12.5)
PLATELETS: 210 10*3/uL (ref 140–400)
RBC: 3.69 MIL/uL — ABNORMAL LOW (ref 4.20–5.80)
RDW: 13.8 % (ref 11.0–15.0)
WBC: 4.3 10*3/uL (ref 3.8–10.8)

## 2015-04-29 LAB — BASIC METABOLIC PANEL
BUN: 17 mg/dL (ref 7–25)
CO2: 22 mmol/L (ref 20–31)
Calcium: 8.6 mg/dL (ref 8.6–10.3)
Chloride: 98 mmol/L (ref 98–110)
Creat: 1.32 mg/dL — ABNORMAL HIGH (ref 0.70–1.25)
Glucose, Bld: 92 mg/dL (ref 65–99)
POTASSIUM: 4.2 mmol/L (ref 3.5–5.3)
SODIUM: 133 mmol/L — AB (ref 135–146)

## 2015-04-29 NOTE — Progress Notes (Signed)
Cardiology Office Note   Date:  04/29/2015   ID:  KEMAURI MUSA, DOB 10-Mar-1950, MRN [id number]  PCP:  Maryella Shivers, MD  Cardiologist:   Thayer Headings, MD   Chief Complaint  Patient presents with  . Follow-up    Problem List 1. Atrial fib 2. COPD  3. Moderate Pulmonary HTN 4 non  Small cell lung cancer - dx - Jan. 2017   Both lungs.   S/p XRT.    DARYN HICKS is a 65 y.o. male who presents for follow up of his atrial fib. Hx of COPD  He was seen in the office a month ago and was found to have have A-fib. He was started on Eliquis but changed to coumadin because of cost. He may want to change back because of the cost of the INR checks.   He seems to be doing better.   He had an echo that showed right and left ventricular failure. Left ventricle: The cavity size was normal. Wall thickness was increased in a pattern of mild LVH. Indeterminant diastolic function (atrial fibrillation). The estimated ejection fraction was 35%. Diffuse hypokinesis. - Aortic valve: There was no stenosis. There was trivial regurgitation. - Aorta: Mildly dilated aortic root. Aortic root dimension: 39 mm (ED). - Mitral valve: There was trivial regurgitation. - Left atrium: The atrium was moderately to severely dilated. - Right ventricle: The cavity size was moderately dilated. Systolic function was moderately reduced. - Right atrium: The atrium was moderately dilated. - Tricuspid valve: Peak RV-RA gradient (S): 37 mm Hg. - Pulmonary arteries: PA peak pressure: 52 mm Hg (S). - Systemic veins: IVC measured 2.8 cm with < 50% respirophasic variation, suggesting RA pressure 15 mmHg.   Feb. 19, 2016:  RAQUAN IANNONE is a 65 y.o. male who presents for follow up of his atrial fib.  He has not had any problems.   He is been therapeutic with his Coumadin. We will schedule a cardioversion.  May 01, 2014:  He had a cardioversion in Feb.  Was successful     HR has been much  better.   Oct. 25, 2016:  Still maintaining NSR  Has reddish palms.  Does not look like a drug rash.  Still smoking  - 3-4 cigarettes a day .   Takes off his home O2 when he smokes  April 29, 2015:  [name] is more short of breath today  O2 Sat is 83% on home O2 Has been diagnosed with non small cell  lung cancer, has had XRT.   Past Medical History  Diagnosis Date  . Current smoker     2 packs per day  . High blood pressure   . Emphysema   . Dysrhythmia     Atrial Fibrillation  . Shortness of breath dyspnea   . Pneumonia     hx of  . Bipolar disorder ([place])     Takes Lamictal & Seroquel  . GERD (gastroesophageal reflux disease)   . Hyperlipemia   . Non-small cell carcinoma of lung, stage 1 ([place]) 02/24/2015  . Abdominal aortic aneurysm (AAA) ([place]) 03/07/2015    Past Surgical History  Procedure Laterality Date  . Lumbar disc surgery      X 2  . Cardioversion N/A 02/26/2014    Procedure: CARDIOVERSION;  Surgeon: Thayer Headings, MD;  Location: [facility] ENDOSCOPY;  Service: Cardiovascular;  Laterality: N/A;  . Colonoscopy w/ polypectomy    . Upper gi endoscopy    . Thumb  fusion Right   . Video bronchoscopy with endobronchial navigation N/A 01/29/2015    Procedure: VIDEO BRONCHOSCOPY WITH ENDOBRONCHIAL NAVIGATION;  Surgeon: Collene Gobble, MD;  Location: [facility];  Service: Thoracic;  Laterality: N/A;  . Video bronchoscopy with endobronchial ultrasound N/A 01/29/2015    Procedure: VIDEO BRONCHOSCOPY WITH ENDOBRONCHIAL ULTRASOUND;  Surgeon: Collene Gobble, MD;  Location: [facility];  Service: Thoracic;  Laterality: N/A;     Current Outpatient Prescriptions  Medication Sig Dispense Refill  . albuterol (PROVENTIL HFA;VENTOLIN HFA) 108 (90 BASE) MCG/ACT inhaler Inhale 2 puffs into the lungs every 6 (six) hours as needed for wheezing or shortness of breath. 1 Inhaler 6  . carvedilol (COREG) 12.5 MG tablet Take 1 tablet (12.5 mg total) by mouth 2 (two) times daily. 62 tablet 11  . lamoTRIgine  (LAMICTAL) 200 MG tablet Take 200 mg by mouth daily at 12 noon.     Marland Kitchen losartan (COZAAR) 50 MG tablet Take 50 mg by mouth daily at 12 noon.     . pravastatin (PRAVACHOL) 20 MG tablet Take 20 mg by mouth daily at 12 noon.     . QUEtiapine (SEROQUEL) 100 MG tablet Take 100 mg by mouth at bedtime.     . RABEprazole (ACIPHEX) 20 MG tablet Take 20 mg by mouth daily at 12 noon.     . rivaroxaban (XARELTO) 20 MG TABS tablet Take 1 tablet (20 mg total) by mouth daily with supper. 30 tablet 11  . Umeclidinium-Vilanterol (ANORO ELLIPTA) 62.5-25 MCG/INH AEPB Inhale 1 puff into the lungs daily. 60 each 5   No current facility-administered medications for this visit.    Allergies:   Review of patient's allergies indicates no known allergies.    Social History:  The patient  reports that he has been smoking Cigarettes.  He has a 10 pack-year smoking history. His smokeless tobacco use includes Chew. He reports that he drinks about 2.4 oz of alcohol per week. He reports that he uses illicit drugs.   Family History:  The patient's family history includes AAA (abdominal aortic aneurysm) in his mother; Cancer in his maternal aunt; Emphysema in his father; Heart attack in his father.    ROS:  Please see the history of present illness.    Review of Systems: Constitutional:  denies fever, chills, diaphoresis, appetite change and fatigue.  HEENT: denies photophobia, eye pain, redness, hearing loss, ear pain, congestion, sore throat, rhinorrhea, sneezing, neck pain, neck stiffness and tinnitus.  Respiratory: denies SOB, DOE, cough, chest tightness, and wheezing.  Cardiovascular: denies chest pain, palpitations and leg swelling.  Gastrointestinal: denies nausea, vomiting, abdominal pain, diarrhea, constipation, blood in stool.  Genitourinary: denies dysuria, urgency, frequency, hematuria, flank pain and difficulty urinating.  Musculoskeletal: denies  myalgias, back pain, joint swelling, arthralgias and gait  problem.   Skin: denies pallor, rash and wound.  Neurological: denies dizziness, seizures, syncope, weakness, light-headedness, numbness and headaches.   Hematological: denies adenopathy, easy bruising, personal or family bleeding history.  Psychiatric/ Behavioral: denies suicidal ideation, mood changes, confusion, nervousness, sleep disturbance and agitation.       All other systems are reviewed and negative.    PHYSICAL EXAM: VS:  BP 90/64 mmHg  Pulse 78  Ht 6' (1.829 m)  Wt 229 lb (103.874 kg)  BMI 31.05 kg/m2  SpO2 83% , BMI Body mass index is 31.05 kg/(m^2). GEN: Well nourished, well developed, in no acute distress HEENT: normal Neck: no JVD, carotid bruits, or masses Cardiac: Irreg. Irreg. ;  no murmurs, rubs, or gallops,no edema  Respiratory:  clear to auscultation bilaterally, normal work of breathing GI: soft, nontender,+ BS.  Abdomen is moderately distended.  MS: no deformity or atrophy Skin: warm and dry, no rash Neuro:  Strength and sensation are intact Psych: normal   EKG:  EKG is not ordered today.  Recent Labs: 02/24/2015: ALT 10; BUN 10.4; Creatinine 1.3; HGB 13.4; Platelets 251; Potassium 4.4; Sodium 136    Lipid Panel    Component Value Date/Time   CHOL 132 10/29/2014 0942   TRIG 90 10/29/2014 0942   HDL 32* 10/29/2014 0942   CHOLHDL 4.1 10/29/2014 0942   VLDL 18 10/29/2014 0942   LDLCALC 82 10/29/2014 0942      Wt Readings from Last 3 Encounters:  04/29/15 229 lb (103.874 kg)  04/04/15 228 lb (103.42 kg)  03/31/15 215 lb 11.2 oz (97.841 kg)      Other studies Reviewed: Additional studies/ records that were reviewed today include: . Review of the above records demonstrates:   EKG:   ASSESSMENT AND PLAN:  1.  Atrial fibrillation: he had a cardioversion in Feb.   Had maintained NSR for about a year but now ( by exam ) is back in atrial fib Continue Xarelto    I doubt that he will maintain NSR given his pulmonary issues .   Will  continue with rate control and anticoagulation   2. Pulmonary HTN:  Encouraged him to stop smoking  - still smoking   3. COPD:  Encouraged him to stop smoking   4. Hypoxemia - encouraged him to consider going to the hospital   Current medicines are reviewed at length with the patient today.  The patient does not have concerns regarding medicines.  The following changes have been made:  no change  Disposition:   FU with me in  1 year   Signed, Nahser, Wonda Cheng, MD  04/29/2015 10:59 AM    [facility] [address], [place], [place]  [address] Phone: [phone]; Fax: [phone]

## 2015-04-29 NOTE — Patient Instructions (Addendum)

## 2015-04-29 NOTE — Progress Notes (Signed)
Pt was started on Xarelto '20mg'$  QD for afib by Dr Acie Fredrickson on 03/26/15.    Reviewed patients medication list.  Pt is not currently on any combined P-gp and strong CYP3A4 inhibitors/inducers (ketoconazole, traconazole, ritonavir, carbamazepine, phenytoin, rifampin, St. John's wort).  Reviewed labs.  SCr 1.32, Weight 103.9kg, CrCl- 83.09.  Dose appropriate based on CrCl.   Hgb and HCT 12.2/35.8 on 04/29/15 stable per Dr Acie Fredrickson.   Called spoke with pt advised labwork stable per Dr Acie Fredrickson, Xarelto dosage '20mg'$  QD appropriate.  Advised pt to continue on same dosage and we will repeat CBC and BMP in 6 months.  Pt states he is due for a f/u with Dr Acie Fredrickson in approx 6 mos would like to have done at [facility].  Put a note on 6 mo recall appt for Dr Acie Fredrickson pt needs CBC and BMP (f/u Xarelto labwork) in 6 mos at appt. Orders placed in computer as well.

## 2015-04-29 NOTE — Patient Instructions (Signed)
Medication Instructions:  Your physician recommends that you continue on your current medications as directed. Please refer to the Current Medication list given to you today.   Labwork: None Ordered   Testing/Procedures: None Ordered   Follow-Up: Your physician wants you to follow-up in: 6 months with Dr. Nahser.  You will receive a reminder letter in the mail two months in advance. If you don't receive a letter, please call our office to schedule the follow-up appointment.   If you need a refill on your cardiac medications before your next appointment, please call your pharmacy.   Thank you for choosing [facility]! Mazikeen Hehn, RN [phone]    

## 2015-04-30 ENCOUNTER — Telehealth: Payer: Self-pay | Admitting: *Deleted

## 2015-04-30 NOTE — Telephone Encounter (Signed)
Pt notified of lab results by phone with verbal understanding.  

## 2015-05-06 ENCOUNTER — Encounter: Payer: Self-pay | Admitting: Radiation Oncology

## 2015-05-06 ENCOUNTER — Telehealth: Payer: Self-pay | Admitting: *Deleted

## 2015-05-06 ENCOUNTER — Ambulatory Visit
Admission: RE | Admit: 2015-05-06 | Discharge: 2015-05-06 | Disposition: A | Discharge status: Home / Self Care | Payer: BLUE CROSS/BLUE SHIELD | Source: Ambulatory Visit | Attending: Radiation Oncology | Admitting: Radiation Oncology

## 2015-05-06 ENCOUNTER — Encounter: Payer: Self-pay | Admitting: Adult Health

## 2015-05-06 ENCOUNTER — Ambulatory Visit (HOSPITAL_BASED_OUTPATIENT_CLINIC_OR_DEPARTMENT_OTHER): Payer: BLUE CROSS/BLUE SHIELD | Admitting: Adult Health

## 2015-05-06 VITALS — BP 106/58 | HR 76 | Temp 97.7°F | Wt 220.7 lb

## 2015-05-06 VITALS — BP 106/58 | HR 76 | Temp 97.7°F | Ht 72.0 in | Wt 220.7 lb

## 2015-05-06 DIAGNOSIS — C3431 Malignant neoplasm of lower lobe, right bronchus or lung: Secondary | ICD-10-CM | POA: Diagnosis not present

## 2015-05-06 DIAGNOSIS — J449 Chronic obstructive pulmonary disease, unspecified: Secondary | ICD-10-CM

## 2015-05-06 DIAGNOSIS — C3490 Malignant neoplasm of unspecified part of unspecified bronchus or lung: Secondary | ICD-10-CM

## 2015-05-06 NOTE — Telephone Encounter (Signed)
CALLED PATIENT TO INFORM OF CT FOR MAY AND CT FOR AUGUST AND FU WITH ALISON PERKINS, SPOKE WITH PATIENT AND HE IS AWARE OF THESE APPTS.

## 2015-05-06 NOTE — Progress Notes (Signed)
Gerald Ho is here for follow up of radiation completed 04/04/15 to his LUL and RUL. He reports back pain which "shoots down his Right Leg" and he rates it a 7/10. He also has some numbness in this leg. He is currently on 4 Liters of oxygen, which he turned up in the office to get his oxygen over 89%. He was 84-86% on 3 Liters. He reports shortness of breath with any type of activity, and is using a wheel chair today. He reports minimal nerve pain in bilateral leg which he wanted to mention. He reports eating well, with no decrease in his appetite. He reports his weakness and shortness of breath with activity has increased since completing radiation.  BP 106/58 mmHg  Pulse 76  Temp(Src) 97.7 F (36.5 C)  Ht 6' (1.829 m)  Wt 220 lb 11.2 oz (100.109 kg)  BMI 29.93 kg/m2  SpO2 89%   Wt Readings from Last 3 Encounters:  05/06/15 220 lb 11.2 oz (100.109 kg)  04/29/15 229 lb (103.874 kg)  04/04/15 228 lb (103.42 kg)

## 2015-05-06 NOTE — Progress Notes (Signed)
Lung Cancer Treatment Summary & Survivorship Care Plan Provided by Mike Craze, NP on 05/06/2015   General Information  Patient Name Gerald Ho   Patient ID [id number]  Date of Birth [birth year]     Your Care Team  Patient Care Team: Maryella Shivers, MD as PCP - General (Family Medicine) Curt Bears, MD - Medical Oncologist Tyler Pita, MD - Radiation Oncologist Shona Simpson, PA-C - Physician Assistant, Radiation Oncology  Mike Craze, NP - Survivorship Nurse Practitioner   To reach your [facility] providers, call [phone].   Cancer Diagnosis Information  Diagnosis Non-Small Cell Lung Cancer  Tumor Location Right lower lobe and Left upper lobe   Diagnosis Date 02/13/15  Histology  Squamous cell  Immunohistochemistry PD-L1 negative   Staging  Non-small cell carcinoma of lung, stage 1 (HCC)   Staging form: Lung, AJCC 7th Edition     Clinical stage: Stage IB (T2a(2), N0, M0)   Family History Family History  Problem Relation Age of Onset  . Emphysema Father   . Heart attack Father   . AAA (abdominal aortic aneurysm) Mother   . Cancer Maternal Aunt     pancreatic    Smoking Status Current smoker      Treatment Summary    Non-small cell carcinoma of lung, stage 1 ([place])   06/06/2014 Imaging CT chest: Lesions in LUL and RLL favored to be post-infectious/inflammatory based on previous imaging; ? mediastinal/hilar adenopathy; short-term f/u recommended with CT chest in 6 months.    12/17/2014 Imaging CT chest: Plaque-like area in RLL and mixed solid/sub-solid nodule in LUL have slightly enlarged since prior exam. Multiple borderline enlarged & minimally enlarged mediastinal & hilar LNs are simular on prior exams    12/26/2014 PET scan LUL nodule measuring 10 mm; primary lung ca cannot be excluded.  RLL mass measuring 2 x 3 cm; synchronous primary lung ca cannot be excluded.  Mediastinal LN in (R) paratracheal measure 10 mm, metastatic dz cannot be  excluded. No evidence of distant mets.   01/29/2015 Procedure LUL biopsy: Benign lung tissue; no atypia or tumor.  RLL lung: Atypical cells present   02/13/2015 Procedure RLL needle core bx: Squamous cell carcinoma.    02/13/2015 Miscellaneous PD-L1 Immunohistochemistry: 0%.    02/24/2015 Initial Diagnosis Non-small cell carcinoma of lung, stage 1 ([place])   03/05/2015 Imaging MRI brain (done for gait disturbance): No evidence of metastatic disease. No acute insult.    03/20/2015 - 04/04/2015 Radiation Therapy SBRT lung Tammi Klippel). Left upper lobe: Total dose 54 Gy in 3 fractions.  Right lower lobe: Total dose 60 Gy in 5 fractions.       Treatment Goal Curative          Your cancer treatment included radiation therapy alone.              Radiation Therapy: Type of Radiation Stereotactic Body Radiation Therapy (SBRT)  Location of Radiation Left upper lobe & right lower lobe of the lungs  Radiation Period Start Date: 03/20/15                End Date: 04/04/15  Number of Radiation Treatments 5 treatments   Total Radiation Dose Left upper lobe: 54 Gy in 3 treatments Right lower lobe: 60 Gy in 5 treatments     Side Effects of Radiation  Possible Long-Term Fatigue Skin irritation/discoloration Hair loss in treated areas  Possible Late-Term (five or more years after treatment)  Skin changes (including discoloration to the treated  area) Lung damage/irritation Formation of scar tissue Rib damage (rare) Heart irritation or damage (for left-sided tumors) Damage to any normal tissues in the irradiated field Secondary cancers (rare) New primary cancers (rare)   Whether you experience late side effects will depend on: The part of your body that was treated The dose and length of your radiation therapy And if you received chemotherapy before, during, or after radiation therapy .                  Survivorship Care & Follow-up Schedule   Becoming a cancer survivor is a time  of great celebration, but also a time of many questions and concerns.  The following information and resources are for you, your caregivers, and your primary care doctor to better understand the needs of a cancer survivor.     How often will I see my cancer providers: Survivor Years 0-2  (Survivor Years defined by length of time since cancer diagnosis)  When?  Responsible Provider  Medical Oncology visits Every 6 months Dr. Julien Nordmann  Radiation Oncologist visits Every 6 months Dr. Tammi Klippel or Shona Simpson, PA  CT scan of chest Every 6 months Dr. Julien Nordmann or Dr. Tammi Klippel    Survivorship Nurse Practitioner (NP) 42-monthafter treatment completed GMike Craze NP     How often will I see my cancer providers: Survivor Years 3-5+ (Survivor Years defined by length of time since cancer diagnosis)  When?  Responsible Provider  Medical Oncology visits Annually Dr. MJulien Nordmann Radiation Oncology visits Annually Dr. MTammi Klippel CT scan of chest Annually Dr. MTammi Klippelor GMike Craze NP  Survivorship Nurse Practitioner (NP) Annually when your doctor refers you to survivorship as a long-term survivor! GMike Craze NP                    Screening Recommendations Get regular screenings, as indicated by your Primary Care Provider   Frequency Responsible Provider  Annual Physical By your Primary Care Provider Should include skin examination Annually Discuss with Dr. FMaryella Shivers Colonoscopy Beginning at age 6549unless clinically indicated to begin sooner Every 10 Years Discuss with Dr. FMaryella Shivers Prostate Exam (Men) Beginning at age 65 Talk with your Primary Care Provider about the pros and cons of testing so you can decide if testing is the right choice for you Discuss with Dr. FMaryella ShiversDiscuss with Dr. FMaryella Shivers Vaccinations (Flu, Shingles, Pneumonia, TDaP, etc.) Discuss with Dr. FMaryella ShiversDiscuss with Dr. FMaryella Shivers     Common  Complaints/Concerns of Cancer Survivors  *Patients often worry if what they are experiencing is normal, or to be expected, given their cancer history and treatment.  Below are common complaints & concerns reported by cancer survivors. If you are concerned about symptoms you are experiencing, please report all symptoms to your cancer care team!   Common Complaints/Concerns for Lung Cancer Survivors   Fatigue  Dry cough  Memory problems and/or confusion  Depression  Anxiety  Weight changes  Insomnia or trouble sleeping  Decreased range of motion (difficulty moving neck)  Intimacy and sexuality  Marital/partner/family relationships   Employment, health insurance concerns, and/or finances  Concerns regarding spirituality  Exercise after cancer treatments  Maintaining a tobacco-free lifestyle        Symptoms to Watch For and Report to Your Provider   *Often cancer survivors are fearful about their cancer coming back or being diagnosed with a new cancer.  Below are symptoms that  you and your loved ones should watch for and report to your provider.   General Symptoms to Watch for and Report to Your Provider .  Return of the cancer symptoms you had before- such as a lump or new growth where your cancer first started . New or unusual pain that seems unrelated to an injury and does not go away, including back pain or bone pain . Weight loss without trying/intending . Unexplained bleeding . A rash or allergic reaction, such as swelling, severe itching or wheezing . Chills or fevers . Persistent headaches . Shortness of breath or difficulty breathing . Bloody stools or blood in your urine . Lumps, bumps, swelling and/or nipple discharge . Nausea, vomiting, diarrhea, loss of appetite, or trouble swallowing . A cough that doesn't go away . Abdominal pain . Swelling in your arms or legs . Fractures . Any other signs mentioned by your doctor or nurse or any unusual symptoms                  that you just can't explain   NOTE: Just because you have certain symptoms, it doesn't mean the cancer has come back or you have a new cancer. Symptoms can be due to other problems that need to be addressed.  It is important to watch for these symptoms and report them to your provider so you can be medically evaluated for any of these concerns!                     What Now?  Thriving & Surviving In Your Life After Cancer   Stop smoking.  If you need help with smoking cessation, talk to your healthcare team about different options to help you!  Consider participating in "Finding Your New Normal" [facility]) survivorship series or other support groups through our [facility].   [employer] fitness program for cancer survivors.   Consider FREE counseling at cancer center.  For more information, contact Ottis Stain at [phone].  Keep your follow-up appointments with all of your specialists (Cancer doctors, Pulmonary doctors, Primary Care Provider, Dietician, Physical Therapist, etc.)  Limit alcohol consumption or abstain from consuming alcohol all together.   Maintain adequate nutrition with plenty of fresh fruits & vegetables.       Thank you so much for allowing [facility] to care for you during your cancer experience.  Our continued commitment is to you and your caregiver(s) health and happiness and again, Congratulations!     Mike Craze, NP Survivorship Program [facility] [phone]               ~For additional information, see attached resources.         Additional Resources for Lung Cancer Survivors  Survivors of cancer often report some of the following needs or concerns after they have completed treatment.  Below are some suggested interventions and resources to help guide you.  Just because your cancer treatment has ended, it does not mean that we stop helping  you manage your needs or concerns.  Please let us know how we can best help you in your new life post-cancer and your return to health and wellness!       Common Needs/Concerns Suggested intervention(s)  Fatigue . This is the most common symptom experienced by lung cancer survivors. You are not alone!  . Regular physical activity - walking 20 minutes daily . Evaluation for hypothyroidism, anemia, sleep disturbance,  or depression . For more information, visit the National Cancer Institute's (NCI) website: http://www.kaiser.com/   Shortness of breath . Very common after lung cancer surgery due to reduced lung volume, as well as decreased physical activity. . Referral to Physical Therapy for improved endurance and stamina.  [place] booklet   Chronic cough or changes in your voice . The cough could be related to other airway diseases, like asthma or COPD, and those conditions should be treated to help with the cough.  . Could be related to acid reflux from the stomach.  You may need a medication to help with the acid which will help the cough.  . Over-the-counter cough suppressants can offer some relief.   . Sometimes, a short course of oral steroids can help suppress a cough.  Ask your doctor which treatment option may be best for you.   Chronic pain . About 50% of lung cancer survivors will experience some type of chronic pain, particularly after thoracotomy surgery.  . Communicate honestly with your doctors and providers about your pain so that we may help make the best recommendations for you.   Smoking cessation and/or maintaining your tobacco-free lifestyle . Referral for Smoking Cessation Counseling . Support Groups and Counseling . Medications to help with nicotine withdrawal symptoms. . Consider acupuncture or hypnosis.  . For more information and valuable resources, visit: smokefree.gov   Memory problems and/or confusion  . About 25% of cancer patients have some degree of cognitive dysfunction (meaning memory problems, trouble concentrating, trouble finding the right word, etc.) after treatment.  This usually gets better over time.  . Some patients may need evaluation for sleep disturbances contributing to memory problems or depression.  . For more information, visit NCI's website at: ForwardDrop.tn   Change in hearing . Certain chemotherapy drugs can cause hearing loss.  Most patients improve with time.  Marland Kitchen Referral to Audiologist for hearing evaluation and recommendations.   Peripheral neuropathy . Some chemotherapy drugs can cause numbness/tingling in the hands and feet.  This usually improves with time and when chemotherapy is completed.  However, some patients may experience symptoms long after their chemotherapy has finished.  . Gabapentin, a prescription medication, may help.  . Certain antidepressant drugs have been shown to help with neuropathy pain.   . Over-the-counter capsaicin cream may help temporarily decrease the sensation of pain.   . Very rarely are strong pain medications needed for treatment of neuropathy.  . Visit NCI's website for more information: http://www.taylor.net/   Depression/General Anxiety  Consider counseling and/or initiation of pharmacotherapy  Referral to Social Worker   Referral to [place]. Call [phone] for details.  Finding Your New Normal [facility]) class through the [place].  Call [phone] for details.  Other resources: Nurse, learning disability.org, Call the [place] at [phone] for additional resources.  Insomnia or trouble sleeping  Practice good sleep habits  Discuss treatments of hot flashes  Consider treatment for anxiety  Read this from the [place] http://www.cancer.org/treatment/treatmentsandsideeffects/physicalsideeffects/fatigue/fatigueinpeoplewithcancer/fatigue-in-people-with-cancer-treating-fatigue  Weight loss/Loss of appetite  Dietary counseling with a Registered Dietician  Attend a healthy cooking class at [facility]. Call [phone] for details.  If your weight loss has been significant (greater than 15 pounds in 6 months or less), then call your doctor.  You need to be seen and evaluated.   Appetite stimulant medications can help improve appetite and help you gain weight.   Eat high-calorie foods as often as you can tolerate.  Weight gain or overweight  ( BMI over 25.0 m  or weight gain of >15 lbs)   Dietary counseling with a Registered Dietician  Attend a healthy cooking class at [facility]. Call [phone] for details.  Referral to a weight management support group (e.g. Weight Watchers, Overeaters Anonymous)  Change in your eating habits, your taste, or your smell . Choose foods with tart flavors like lemon wedges, lemonade, citrus fruits, vinegar, and pickled foods.  . Add sweeteners or a little bit of sugar to foods. A little sweetness can help increase pleasant tastes. . Season foods with herbs/spices/or other seasonings like onion, garlic, chili powder, barbecue sauce, mustard, ketchup, mint, etc.  . If meats taste strange, marinate or cook meats in sweet juices, fruits, acidic dressings, or wine.  . If certain foods or drinks smell unpleasant while cooking, choose foods that do not need to be cooked, like cold sandwiches, crackers and cheese, yogurt and fruit, or cold cereal.  . Keep your mouth clean and healthy by rinsing and brushing your teeth after meals and before bed.   Decreased range of motion   Referral to Physical Therapy  Consider taking yoga or Tai Chi classes at the [facility].  Call [phone] for a schedule.  Intimacy and sexuality  Evaluation and treatment for anxiety, depression, as  appropriate  Address body image concerns  Attend the American Cancer Society's Look Good.Feel Better program. Call [phone] for a schedule of when this event is offered.   Try a vaginal lubricant (Astroglide) or moisturizer (Replens- 1x every 3 days)  Consider attending support groups at [facility]. Call [phone] for details.  Attend the Finding Your New Normal [facility]) class at [facility]. Call [phone] for details.  Marital/partner/family relationships  Referral to Forensic psychologist Groups at the [employer]   Finding Your New Normal [facility]) class through the [place]. Call [phone] for details.  Stigma of lung cancer diagnosis   Do not blame yourself for having cancer!  If you are experiencing anxiety or depression as a result of what others think about your diagnosis, then let us help you!  Referral to Education officer, museum or Counselor  Referral to [employer], health insurance, and/or finances  Referral to Education officer, museum  Concerns regarding spirituality, faith, coping, relating to God, loss of faith, facing my mortality, and/or loss of my sense of purpose  Referral to Chaplain   Referral to [employer] activities: www.hirschwellnessnetwork.org  Support Groups at the [facility]. Call [phone] for details.  Finding Your New Normal [facility]) class through [facility].  Call [phone] for details.  Returning to an exercise program  Consider joining the [facility], a 12-week program that meets twice a week for 90 minutes using traditional exercise methods to ease you back into fitness and help you maintain a healthy weight.   This program is FREE for you and a friend.  Offered at several local [place].    Contact Elaina Hoops at [phone] or [email or web address]  Consider joining [place] or Yoga at [facility]. Call [phone] for a schedule.

## 2015-05-06 NOTE — Progress Notes (Signed)
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 Chronic obstructive pulmonary disease, unspecified COPD type ([place]) 496 J44.9   2. Non-small cell carcinoma of lung, stage 1 (HCC) 162.5 C34.31     Interval Since Last Radiation:  1 month    03/20/15-04/04/15:  1. 54 Gy to the LUL in 3 fractions 2. 60 Gy to the RLL in 5 fractions  Narrative:  The patient returns today for routine follow-up. He tolerated treatment well, and has been seen by Dr. Lamonte Sakai in March 2017. He continues to have episodes of COPD exacerbations and he was counseled on continuation of Anoro and albuterol. He also had been given a medrol dose pack and antibiotics at that time. He has a follow up with Dr. Julien Nordmann in June 2017.  On review of systems, the patient reports that he is doing well overall. He denies any chest pain,  cough, fevers, chills, night sweats, unintended weight changes. He continues to have shortness of breath on exertion and his pulse ox on 3L [place] fluctuates between between the mid 80%- upper 80%. He states it has been a few months since he was in the low 90% range for this. He feels better since his antibiotics and steroids, and denies any productive mucous at this time. He is trying to cut back on smoking. No other complaints are verbalized.  ALLERGIES:  has No Known Allergies.  Meds: Current Outpatient Prescriptions  Medication Sig Dispense Refill  . albuterol (PROVENTIL HFA;VENTOLIN HFA) 108 (90 BASE) MCG/ACT inhaler Inhale 2 puffs into the lungs every 6 (six) hours as needed for wheezing or shortness of breath. 1 Inhaler 6  . carvedilol (COREG) 12.5 MG tablet Take 1 tablet (12.5 mg total) by mouth 2 (two) times  daily. 62 tablet 11  . lamoTRIgine (LAMICTAL) 200 MG tablet Take 200 mg by mouth daily at 12 noon.     Marland Kitchen losartan (COZAAR) 50 MG tablet Take 50 mg by mouth daily at 12 noon.     . pravastatin (PRAVACHOL) 20 MG tablet Take 20 mg by mouth daily at 12 noon.     . QUEtiapine (SEROQUEL) 100 MG tablet Take 100 mg by mouth at bedtime.     . RABEprazole (ACIPHEX) 20 MG tablet Take 20 mg by mouth daily at 12 noon.     . rivaroxaban (XARELTO) 20 MG TABS tablet Take 1 tablet (20 mg total) by mouth daily with supper. 30 tablet 11  . Umeclidinium-Vilanterol (ANORO ELLIPTA) 62.5-25 MCG/INH AEPB Inhale 1 puff into the lungs daily. 60 each 5   No current facility-administered medications for this encounter.    Physical Findings:  height is 6' (1.829 m) and weight is 220 lb 11.2 oz (100.109 kg). His temperature is 97.7 F (36.5 C). His blood pressure is 106/58 and his pulse is 76. His oxygen saturation is 89%.   Pain scale 0/10 In general this is a well appearing Caucasian male in no acute distress. He is alert and oriented x4 and appropriate throughout the examination. HEENT reveals that the patient is normocephalic, atraumatic. Skin is intact without any evidence of gross lesions. Cardiovascular exam reveals a regular rate and rhythm, no clicks rubs or  murmurs are auscultated. Chest is clear to auscultation bilaterally. Lymphatic assessment is performed and does not reveal any adenopathy in the cervical, supraclavicular, axillary, or inguinal chains. Abdomen has active bowel sounds in all quadrants and is intact. The abdomen is soft, non tender, non distended. Lower extremities are negative for pretibial pitting edema, deep calf tenderness, cyanosis or clubbing.    Lab Findings: Lab Results  Component Value Date   WBC 4.3 04/29/2015   WBC 8.4 02/24/2015   HGB 12.2* 04/29/2015   HGB 13.4 02/24/2015   HCT 35.8* 04/29/2015   HCT 38.2* 02/24/2015   PLT 210 04/29/2015   PLT 251 02/24/2015    Lab Results   Component Value Date   NA 133* 04/29/2015   NA 136 02/24/2015   K 4.2 04/29/2015   K 4.4 02/24/2015   CHLORIDE 101 02/24/2015   CO2 22 04/29/2015   CO2 25 02/24/2015   GLUCOSE 92 04/29/2015   GLUCOSE 95 02/24/2015   BUN 17 04/29/2015   BUN 10.4 02/24/2015   CREATININE 1.32* 04/29/2015   CREATININE 1.3 02/24/2015   CREATININE 1.31* 01/23/2015   BILITOT 1.05 02/24/2015   BILITOT 0.6 01/23/2015   ALKPHOS 89 02/24/2015   ALKPHOS 67 01/23/2015   AST 16 02/24/2015   AST 17 01/23/2015   ALT 10 02/24/2015   ALT 13* 01/23/2015   PROT 7.6 02/24/2015   PROT 7.1 01/23/2015   ALBUMIN 4.0 02/24/2015   ALBUMIN 3.7 01/23/2015   CALCIUM 8.6 04/29/2015   CALCIUM 9.4 02/24/2015   ANIONGAP 10 02/24/2015   ANIONGAP 9 01/23/2015    Radiographic Findings: No results found.  Impression/Plan: 1. 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 We discussed the rationale for a baseline CT in next two weeks, and that we will follow up with the results by phone as he will be seeing Dr. Julien Nordmann in June 2017. We discussed the role of next re-staging scan in 3 months, with subsequent follow up in office with me afterwards. We outlined that if at that time his findings are stable, we would follow him with CT and evaluation at  6 month intervals until he is 5 years out from treatment. He states agreement and understanding.  2. COPD. The patient continues to follow up with Dr. Lamonte Sakai who is aware of his pulse ox levels. The patient continues to adjust his oxygen according to activity. He will contact Dr. Lamonte Sakai if he notes any additional changes prior to his next visit. 3. Survivorship. The patient will meet with Mike Craze, NP today for establishing care in survivorship clinic, and was counseled on the rationale for this.    Carola Rhine, PAC

## 2015-05-06 NOTE — Progress Notes (Signed)
CLINIC:  Survivorship  REASON FOR VISIT:  Survivorship Care Plan visit & to address acute survivorship needs   BRIEF ONCOLOGY HISTORY:   Non-small cell carcinoma of lung, stage 1 ([place])   06/06/2014 Imaging CT chest: Lesions in LUL and RLL favored to be post-infectious/inflammatory based on previous imaging; ? mediastinal/hilar adenopathy; short-term f/u recommended with CT chest in 6 months.    12/17/2014 Imaging CT chest: Plaque-like area in RLL and mixed solid/sub-solid nodule in LUL have slightly enlarged since prior exam. Multiple borderline enlarged & minimally enlarged mediastinal & hilar LNs are simular on prior exams    12/26/2014 PET scan LUL nodule measuring 10 mm; primary lung ca cannot be excluded.  RLL mass measuring 2 x 3 cm; synchronous primary lung ca cannot be excluded.  Mediastinal LN in (R) paratracheal measure 10 mm, metastatic dz cannot be excluded. No evidence of distant mets.   01/29/2015 Procedure LUL biopsy: Benign lung tissue; no atypia or tumor.  RLL lung: Atypical cells present   02/13/2015 Procedure RLL needle core bx: Squamous cell carcinoma.    02/13/2015 Miscellaneous PD-L1 Immunohistochemistry: 0%.    02/24/2015 Initial Diagnosis Non-small cell carcinoma of lung, stage 1 ([place])   03/05/2015 Imaging MRI brain (done for gait disturbance): No evidence of metastatic disease. No acute insult.    03/20/2015 - 04/04/2015 Radiation Therapy SBRT lung Tammi Klippel). Left upper lobe: Total dose 54 Gy in 3 fractions.  Right lower lobe: Total dose 60 Gy in 5 fractions.      INTERVAL HISTORY: Gerald Ho reports feeling pretty well since completing SBRT for bilateral lung masses about 1 month ago. He endorses recent worsening shortness of breath; recently saw Dr. Lamonte Sakai in Pulmonology who started the patient on a prednisone dose pack and antibiotics which has helped some.  He is on continuous O2 via nasal cannula and titrates his O2 up and down as needed.  The patient also thinks his A-fib may  be "acting up" again, making him more short of breath.  He also endorses a periodic cough.  He denies any pain at present, but reports having chronic, intermittent "flash of pain" to his right leg for about the past year.  He has a history of previous back surgeries and states that the pain feels similar to when he had back surgeries in the past.  He lives alone in [place].  Reports that he cannot be very active given his COPD/oxygen requirement.  He denies any depression and feels that his mood/spirits are good.    ADDITIONAL REVIEW OF SYSTEMS:  Review of Systems  Constitutional: Positive for malaise/fatigue.       Energy levels are okay "since I am basically homebound and can't do much but watch TV."   Respiratory: Positive for cough.   Gastrointestinal: Negative for heartburn, nausea and vomiting.  Skin: Negative for rash.  Psychiatric/Behavioral: Negative for depression.     PAST MEDICAL & SURGICAL HISTORY:  Past Medical History  Diagnosis Date  . Current smoker     2 packs per day  . High blood pressure   . Emphysema   . Dysrhythmia     Atrial Fibrillation  . Shortness of breath dyspnea   . Pneumonia     hx of  . Bipolar disorder ([place])     Takes Lamictal & Seroquel  . GERD (gastroesophageal reflux disease)   . Hyperlipemia   . Non-small cell carcinoma of lung, stage 1 ([place]) 02/24/2015  . Abdominal aortic aneurysm (AAA) ([place]) 03/07/2015  Past Surgical History  Procedure Laterality Date  . Lumbar disc surgery      X 2  . Cardioversion N/A 02/26/2014    Procedure: CARDIOVERSION;  Surgeon: Thayer Headings, MD;  Location: [facility] ENDOSCOPY;  Service: Cardiovascular;  Laterality: N/A;  . Colonoscopy w/ polypectomy    . Upper gi endoscopy    . Thumb fusion Right   . Video bronchoscopy with endobronchial navigation N/A 01/29/2015    Procedure: VIDEO BRONCHOSCOPY WITH ENDOBRONCHIAL NAVIGATION;  Surgeon: Collene Gobble, MD;  Location: [place];  Service: Thoracic;  Laterality: N/A;  .  Video bronchoscopy with endobronchial ultrasound N/A 01/29/2015    Procedure: VIDEO BRONCHOSCOPY WITH ENDOBRONCHIAL ULTRASOUND;  Surgeon: Collene Gobble, MD;  Location: [place];  Service: Thoracic;  Laterality: N/A;    CURRENT MEDICATIONS:  Current Outpatient Prescriptions on File Prior to Visit  Medication Sig Dispense Refill  . albuterol (PROVENTIL HFA;VENTOLIN HFA) 108 (90 BASE) MCG/ACT inhaler Inhale 2 puffs into the lungs every 6 (six) hours as needed for wheezing or shortness of breath. 1 Inhaler 6  . carvedilol (COREG) 12.5 MG tablet Take 1 tablet (12.5 mg total) by mouth 2 (two) times daily. 62 tablet 11  . lamoTRIgine (LAMICTAL) 200 MG tablet Take 200 mg by mouth daily at 12 noon.     Marland Kitchen losartan (COZAAR) 50 MG tablet Take 50 mg by mouth daily at 12 noon.     . pravastatin (PRAVACHOL) 20 MG tablet Take 20 mg by mouth daily at 12 noon.     . QUEtiapine (SEROQUEL) 100 MG tablet Take 100 mg by mouth at bedtime.     . RABEprazole (ACIPHEX) 20 MG tablet Take 20 mg by mouth daily at 12 noon.     . rivaroxaban (XARELTO) 20 MG TABS tablet Take 1 tablet (20 mg total) by mouth daily with supper. 30 tablet 11  . Umeclidinium-Vilanterol (ANORO ELLIPTA) 62.5-25 MCG/INH AEPB Inhale 1 puff into the lungs daily. 60 each 5   No current facility-administered medications on file prior to visit.    ALLERGIES: No Known Allergies   PHYSICAL EXAM:  Filed Vitals:   05/06/15 1331  BP: 106/58  Pulse: 76  Temp: 97.7 F (36.5 C)   Filed Weights   05/06/15 1331  Weight: 220 lb 11.2 oz (100.109 kg)    General: Chronically-ill appearing gentleman seated in wheelchair, wearing oxygen. No acute distress. Unaccompanied today.  HEENT: Head is normocephalic.  Wearing sunglasses. Oral mucosa is pink and moist without lesions. Oropharynx is pink and moist, without lesions. Chewing tobacco in mouth. Lymph: No cervical, supraclavicular, or infraclavicular lymphadenopathy noted on palpation.   Cardiovascular:  Irregular.  Respiratory: Upper lobes with inspiratory wheezes.  Right base with wheezes.  Left base diminished. Chest expansion symmetric. O2 at 4L via [place] in place with portable oxygen device.  GU: Deferred.   Neuro: No focal deficits. Unable to observe gait given patient seated in wheelchair.   Psych: Normal mood and affect for situation. Extremities: No edema.   Skin: Warm and dry.   LABORATORY DATA:  None for this visit.   DIAGNOSTIC IMAGING:  None for this visit.    ASSESSMENT & PLAN:  Gerald Ho is a pleasant 65 y.o. male with history of Stage IB bilateral lung cancers, treated with SBRT; completed treatment on 04/04/15.  Patient presents to survivorship clinic today for survivorship care plan visit and to address any acute survivorship concerns since completing treatment.   1. Stage IB lung cancer: Gerald Ho is continuing to recover from the effects of radiation therapy. He will have follow-up with Dr. Julien Nordmann and Radiation Oncology to monitor his response to treatment and possible mediastinal adenopathy.  Today, he received a copy of his [facility]) document, which was reviewed with him in detail.  The SCP details his cancer treatment history and potential late/long-term side effects of those treatments.  We discussed the follow-up schedule he can anticipate with interval imaging for surveillance of his cancer.  I have also shared a copy of his treatment summary/SCP with his PCP.    2. Shortness of breath/Cough: His respiratory symptoms are likely multifactorial.  He is recovering from a recent COPD flare, where he received prednisone dosepak and antibiotics from Dr. Lamonte Sakai, his pulmonologist.  His cough could be contributed to the inflammation from radiation therapy and may improve with time, however his chronic COPD/emphysema and current smoking may make discerning the improvement of symptoms more difficult.     3. Right leg pain, chronic: His right leg pain has reportedly  been present for the past year or so.  He reports that the pain is similar to when he had back surgeries in the past and had radicular pain as a result.  However, I did encourage him to continue to monitor these symptoms, as lung cancer can spread to the bone.  He states that his pain is well-managed without any additional medications at this time.    4. Smoking cessation: Gerald Ho continues to smoke cigarettes.  He smokes about 2 cigarettes per day, which is significantly less than he used to smoke.  He also is using chewing tobacco/"dip" daily as well; he states that 1 can lasts him about 5 days.   I provided education that both smoking cigarettes and using chewing tobacco are linked to several types of cancer including oral cancers, bladder cancer, and recurrent lung cancer.  He is not ready to quit either.  He was given handouts and brochures re: 1-800-QUITNOW resources, which would provide him with smoking cessation support and possibly free patches, lozenges, etc to aid in him quitting. He was also given the "When Smokers Quit" and "Smart Move" booklets from the [place] about smoking cessation.    5. Physical activity/Healthy eating: Getting adequate physical activity and maintaining a healthy diet as a cancer survivor is important for overall wellness and reduces the risk of cancer recurrence. His physical activity is limited because of his shortness of breath, but he could benefit from some strength exercises.  I encouraged him to walk short distances and do light weightlifting; he could use soup cans in lieu of hand weights to help rebuild his strength and stamina.  We discussed the [facility], which is a fitness program that is offered to cancer survivors free of charge.  We also reviewed "The Nutrition Rainbow" handout, as well as the American Cancer Society's booklet with recommendations for nutrition and physical activity.    6. Health promotion/Cancer screening:   Mr.  Eid is reportedly up-to-date on his colonoscopy.   I encouraged him to talk with his PCP about arranging appropriate cancer screening tests, as appropriate.  He was given national guidelines about health maintenance as it relates to colonoscopy, PSA testing, annual physicals/skin exams, and vaccinations.  I encouraged him to discuss these with his PCP.   7. Support services/Counseling: It is not uncommon for this period of the patient's cancer care trajectory to be one of many emotions and stressors.  Mr. Madalyn Rob was encouraged to take advantage of our many support services programs, support groups, and/or counseling in coping with her new life as a cancer survivor after completing anti-cancer treatment. He was given a calendar of the cancer center's support services events, as well as brochures for our spiritual care and free counseling resources.    Dispo:  -Return to survivorship clinic as needed; no additional follow-up needed at this time.  -Consider transitioning the patient to long-term survivorship, when clinically appropriate.   A total of 20 minutes was spent in face-to-face care of this patient, with greater than 50% of that time spent in counseling and care coordination.   Mike Craze, NP [place] [phone]

## 2015-05-07 ENCOUNTER — Other Ambulatory Visit: Payer: Self-pay | Admitting: Adult Health

## 2015-05-08 ENCOUNTER — Telehealth: Payer: Self-pay | Admitting: *Deleted

## 2015-05-08 NOTE — Telephone Encounter (Signed)
CALLED PATIENT TO INFORM THAT CT HAS BEEN MOVED TO 06-05-15, SPOKE WITH PATIENT AND HE IS AWARE OF THE TEST CHANGE AND IS GOOD WITH IT.

## 2015-05-27 ENCOUNTER — Ambulatory Visit (HOSPITAL_COMMUNITY): Payer: BLUE CROSS/BLUE SHIELD

## 2015-06-05 ENCOUNTER — Encounter (HOSPITAL_COMMUNITY): Payer: Self-pay

## 2015-06-05 ENCOUNTER — Ambulatory Visit (HOSPITAL_COMMUNITY)
Admission: RE | Admit: 2015-06-05 | Discharge: 2015-06-05 | Disposition: A | Discharge status: Home / Self Care | Payer: BLUE CROSS/BLUE SHIELD | Source: Ambulatory Visit | Attending: Radiation Oncology | Admitting: Radiation Oncology

## 2015-06-05 ENCOUNTER — Other Ambulatory Visit (HOSPITAL_BASED_OUTPATIENT_CLINIC_OR_DEPARTMENT_OTHER): Payer: BLUE CROSS/BLUE SHIELD

## 2015-06-05 DIAGNOSIS — K8020 Calculus of gallbladder without cholecystitis without obstruction: Secondary | ICD-10-CM | POA: Diagnosis not present

## 2015-06-05 DIAGNOSIS — M4854XA Collapsed vertebra, not elsewhere classified, thoracic region, initial encounter for fracture: Secondary | ICD-10-CM | POA: Insufficient documentation

## 2015-06-05 DIAGNOSIS — C3431 Malignant neoplasm of lower lobe, right bronchus or lung: Secondary | ICD-10-CM | POA: Diagnosis not present

## 2015-06-05 DIAGNOSIS — M4856XA Collapsed vertebra, not elsewhere classified, lumbar region, initial encounter for fracture: Secondary | ICD-10-CM | POA: Insufficient documentation

## 2015-06-05 DIAGNOSIS — R911 Solitary pulmonary nodule: Secondary | ICD-10-CM | POA: Diagnosis not present

## 2015-06-05 DIAGNOSIS — I2510 Atherosclerotic heart disease of native coronary artery without angina pectoris: Secondary | ICD-10-CM | POA: Insufficient documentation

## 2015-06-05 DIAGNOSIS — C3491 Malignant neoplasm of unspecified part of right bronchus or lung: Secondary | ICD-10-CM

## 2015-06-05 LAB — CBC WITH DIFFERENTIAL/PLATELET
BASO%: 0.3 % (ref 0.0–2.0)
Basophils Absolute: 0 10*3/uL (ref 0.0–0.1)
EOS%: 3 % (ref 0.0–7.0)
Eosinophils Absolute: 0.2 10*3/uL (ref 0.0–0.5)
HEMATOCRIT: 35.9 % — AB (ref 38.4–49.9)
HGB: 12.5 g/dL — ABNORMAL LOW (ref 13.0–17.1)
LYMPH#: 1.2 10*3/uL (ref 0.9–3.3)
LYMPH%: 19.4 % (ref 14.0–49.0)
MCH: 35.2 pg — ABNORMAL HIGH (ref 27.2–33.4)
MCHC: 34.8 g/dL (ref 32.0–36.0)
MCV: 101.1 fL — ABNORMAL HIGH (ref 79.3–98.0)
MONO#: 0.4 10*3/uL (ref 0.1–0.9)
MONO%: 5.9 % (ref 0.0–14.0)
NEUT#: 4.2 10*3/uL (ref 1.5–6.5)
NEUT%: 71.4 % (ref 39.0–75.0)
PLATELETS: 177 10*3/uL (ref 140–400)
RBC: 3.55 10*6/uL — ABNORMAL LOW (ref 4.20–5.82)
RDW: 16.9 % — AB (ref 11.0–14.6)
WBC: 5.9 10*3/uL (ref 4.0–10.3)

## 2015-06-05 LAB — COMPREHENSIVE METABOLIC PANEL
ALBUMIN: 3.8 g/dL (ref 3.5–5.0)
ALK PHOS: 88 U/L (ref 40–150)
ANION GAP: 8 meq/L (ref 3–11)
AST: 11 U/L (ref 5–34)
BUN: 16 mg/dL (ref 7.0–26.0)
CALCIUM: 9 mg/dL (ref 8.4–10.4)
CO2: 23 mEq/L (ref 22–29)
Chloride: 103 mEq/L (ref 98–109)
Creatinine: 1.4 mg/dL — ABNORMAL HIGH (ref 0.7–1.3)
EGFR: 53 mL/min/{1.73_m2} — AB (ref >90)
Glucose: 111 mg/dl (ref 70–140)
POTASSIUM: 4.3 meq/L (ref 3.5–5.1)
Sodium: 134 mEq/L — ABNORMAL LOW (ref 136–145)
Total Bilirubin: 1.6 mg/dL — ABNORMAL HIGH (ref 0.20–1.20)
Total Protein: 7.4 g/dL (ref 6.4–8.3)

## 2015-06-05 MED ORDER — IOPAMIDOL (ISOVUE-300) INJECTION 61%
75.0000 mL | Freq: Once | INTRAVENOUS | Status: AC | PRN
Start: 2015-06-05 — End: 2015-06-05
  Administered 2015-06-05: 75 mL via INTRAVENOUS

## 2015-06-10 ENCOUNTER — Telehealth: Payer: Self-pay | Admitting: Medical Oncology

## 2015-06-10 NOTE — Telephone Encounter (Signed)
Appointments confirmed.  

## 2015-06-12 ENCOUNTER — Ambulatory Visit (HOSPITAL_BASED_OUTPATIENT_CLINIC_OR_DEPARTMENT_OTHER): Payer: BLUE CROSS/BLUE SHIELD | Admitting: Internal Medicine

## 2015-06-12 ENCOUNTER — Encounter: Payer: Self-pay | Admitting: Internal Medicine

## 2015-06-12 ENCOUNTER — Encounter: Payer: Self-pay | Admitting: *Deleted

## 2015-06-12 ENCOUNTER — Telehealth: Payer: Self-pay | Admitting: Internal Medicine

## 2015-06-12 VITALS — BP 127/71 | HR 71 | Temp 97.6°F | Resp 17 | Wt 221.9 lb

## 2015-06-12 DIAGNOSIS — C3431 Malignant neoplasm of lower lobe, right bronchus or lung: Secondary | ICD-10-CM

## 2015-06-12 MED ORDER — METHYLPREDNISOLONE 4 MG PO TBPK: ORAL_TABLET | ORAL | Status: DC

## 2015-06-12 NOTE — Progress Notes (Signed)
[place] [phone]   Fax:([phone] [place], [place] No address on file  DIAGNOSIS: Stage IB (T2a, N0, M0) in the right lung presented with right lower lobe lung mass that biopsy proven to be squamous cell carcinoma (PDL 1 expression 0%) diagnosed in February 2017. The patient also has suspicious mediastinal lymphadenopathy as well as left upper lobe lung nodule concerning for synchronous primary lung cancer.  PRIOR THERAPY: Curative radiotherapy with stereotactic body radiotherapy to the right lower lobe lung mass and left upper lobe lung nodule under the care of Dr. Tammi Klippel.   CURRENT THERAPY: Observation  INTERVAL HISTORY: Gerald Ho 65 y.o. male returns to the clinic today for follow-up visit. The patient is feeling fine today with no specific complaints except for the baseline shortness of breath and he is currently on oxygen. He completed a course of curative radiotherapy 2 months ago. His shortness breath has worsened since his radiotherapy. He denied having any significant chest pain but has mild cough with no hemoptysis. He has no nausea or vomiting, no fever or chills. He had repeat CT scan of the chest performed recently and he is here for evaluation and discussion of his scan results.  MEDICAL HISTORY: Past Medical History  Diagnosis Date  . Current smoker     2 packs per day  . High blood pressure   . Emphysema   . Dysrhythmia     Atrial Fibrillation  . Shortness of breath dyspnea   . Pneumonia     hx of  . Bipolar disorder ([place])     Takes Lamictal & Seroquel  . GERD (gastroesophageal reflux disease)   . Hyperlipemia   . Non-small cell carcinoma of lung, stage 1 ([place]) 02/24/2015  . Abdominal aortic aneurysm (AAA) ([place]) 03/07/2015    ALLERGIES:  has No Known Allergies.  MEDICATIONS:  Current Outpatient Prescriptions  Medication Sig Dispense Refill  . albuterol (PROVENTIL HFA;VENTOLIN HFA) 108 (90  BASE) MCG/ACT inhaler Inhale 2 puffs into the lungs every 6 (six) hours as needed for wheezing or shortness of breath. 1 Inhaler 6  . carvedilol (COREG) 12.5 MG tablet Take 1 tablet (12.5 mg total) by mouth 2 (two) times daily. 62 tablet 11  . lamoTRIgine (LAMICTAL) 200 MG tablet Take 200 mg by mouth daily at 12 noon.     Marland Kitchen losartan (COZAAR) 50 MG tablet Take 50 mg by mouth daily at 12 noon.     . pravastatin (PRAVACHOL) 20 MG tablet Take 20 mg by mouth daily at 12 noon.     . QUEtiapine (SEROQUEL) 100 MG tablet Take 100 mg by mouth at bedtime.     . RABEprazole (ACIPHEX) 20 MG tablet Take 20 mg by mouth daily at 12 noon.     . rivaroxaban (XARELTO) 20 MG TABS tablet Take 1 tablet (20 mg total) by mouth daily with supper. 30 tablet 11  . Umeclidinium-Vilanterol (ANORO ELLIPTA) 62.5-25 MCG/INH AEPB Inhale 1 puff into the lungs daily. 60 each 5   No current facility-administered medications for this visit.    SURGICAL HISTORY:  Past Surgical History  Procedure Laterality Date  . Lumbar disc surgery      X 2  . Cardioversion N/A 02/26/2014    Procedure: CARDIOVERSION;  Surgeon: Thayer Headings, MD;  Location: [facility] ENDOSCOPY;  Service: Cardiovascular;  Laterality: N/A;  . Colonoscopy w/ polypectomy    . Upper gi endoscopy    .  Thumb fusion Right   . Video bronchoscopy with endobronchial navigation N/A 01/29/2015    Procedure: VIDEO BRONCHOSCOPY WITH ENDOBRONCHIAL NAVIGATION;  Surgeon: Collene Gobble, MD;  Location: [place];  Service: Thoracic;  Laterality: N/A;  . Video bronchoscopy with endobronchial ultrasound N/A 01/29/2015    Procedure: VIDEO BRONCHOSCOPY WITH ENDOBRONCHIAL ULTRASOUND;  Surgeon: Collene Gobble, MD;  Location: [place];  Service: Thoracic;  Laterality: N/A;    REVIEW OF SYSTEMS:  A comprehensive review of systems was negative except for: Constitutional: positive for fatigue Respiratory: positive for cough and dyspnea on exertion   PHYSICAL EXAMINATION: General appearance: alert,  cooperative, fatigued and no distress Head: Normocephalic, without obvious abnormality, atraumatic Neck: no adenopathy, no JVD, supple, symmetrical, trachea midline and thyroid not enlarged, symmetric, no tenderness/mass/nodules Lymph nodes: Cervical, supraclavicular, and axillary nodes normal. Resp: wheezes bilaterally Back: symmetric, no curvature. ROM normal. No CVA tenderness. Cardio: regular rate and rhythm, S1, S2 normal, no murmur, click, rub or gallop GI: soft, non-tender; bowel sounds normal; no masses,  no organomegaly Extremities: extremities normal, atraumatic, no cyanosis or edema Neurologic: Alert and oriented X 3, normal strength and tone. Normal symmetric reflexes. Normal coordination and gait  ECOG PERFORMANCE STATUS: 1 - Symptomatic but completely ambulatory  Blood pressure 127/71, pulse 71, temperature 97.6 F (36.4 C), temperature source Oral, resp. rate 17, weight 221 lb 14.4 oz (100.653 kg), SpO2 92 %.  LABORATORY DATA: Lab Results  Component Value Date   WBC 5.9 06/05/2015   HGB 12.5* 06/05/2015   HCT 35.9* 06/05/2015   MCV 101.1* 06/05/2015   PLT 177 06/05/2015      Chemistry      Component Value Date/Time   NA 134* 06/05/2015 1032   NA 133* 04/29/2015 1124   K 4.3 06/05/2015 1032   K 4.2 04/29/2015 1124   CL 98 04/29/2015 1124   CO2 23 06/05/2015 1032   CO2 22 04/29/2015 1124   BUN 16.0 06/05/2015 1032   BUN 17 04/29/2015 1124   CREATININE 1.4* 06/05/2015 1032   CREATININE 1.32* 04/29/2015 1124   CREATININE 1.31* 01/23/2015 1451      Component Value Date/Time   CALCIUM 9.0 06/05/2015 1032   CALCIUM 8.6 04/29/2015 1124   ALKPHOS 88 06/05/2015 1032   ALKPHOS 67 01/23/2015 1451   AST 11 06/05/2015 1032   AST 17 01/23/2015 1451   ALT <9 06/05/2015 1032   ALT 13* 01/23/2015 1451   BILITOT 1.60* 06/05/2015 1032   BILITOT 0.6 01/23/2015 1451       RADIOGRAPHIC STUDIES: Ct Chest W Contrast  06/05/2015  CLINICAL DATA:  Restaging non-small cell  lung cancer, post completion of therapy. EXAM: CT CHEST WITH CONTRAST TECHNIQUE: Multidetector CT imaging of the chest was performed during intravenous contrast administration. CONTRAST:  78m ISOVUE-300 IOPAMIDOL (ISOVUE-300) INJECTION 61% COMPARISON:  12/17/2014 FINDINGS: Mediastinum/Lymph Nodes: The heart is normal in size. There is minimal pericardial thickening. Again seen is advanced calcified coronary artery atherosclerotic disease. Atherosclerotic disease of the thoracic aorta with calcified and noncalcified plaque is also noted. There are persistent borderline enlarged mediastinal lymph nodes, mostly pretracheal with the largest node anterior to the rind mainstem bronchus measuring 15 mm in short axis. There is mild prominence of bilateral hilar lymph nodes. Lungs/Pleura: There are stable changes of paraseptal and centrilobular emphysema with upper lobe predominance and chronic interstitial lung changes. There is stable in size plaque-like spiculated area of subpleural thickening in the posterior aspect of the superior segment of the right  lower lobe. Interstitial thickening is seen extending anteriorly to this area. There is also pleural and subpleural linear thickening involving the posterior pleura adjacent to this area. A spiculated solid and sub solid nodule in the posterior segment of the left upper lobe has slightly decreased in size measuring 11 by 12 mm. Upper abdomen: Mild cholelithiasis without evidence of acute cholecystitis. Musculoskeletal: Chronic compression fracture of T6 and L1 vertebral bodies are stable. There are no aggressive appearing lytic or blastic osseous lesions. IMPRESSION: Stable in size plaque-like spiculated area of subpleural thickening in the posterior aspect of the superior segment of the right lower lobe, with interval development of linear pleural and subpleural thickening along the posterior pleura and minor and major fissures, and development of linear interstitial  opacities extending anteriorly from this mass. Findings are suspicious for lymphangitic spread of disease. Decrease in size and prominence of left upper lobe spiculated nodule now measuring 11 mm. Background of severe emphysematous changes. Advanced atherosclerotic disease of the coronary arteries and aorta. Cardiomegaly. Mild cholelithiasis without evidence of acute cholecystitis. Chronic compression fractures of T6 and L1 vertebral bodies. Electronically Signed   By: Fidela Salisbury M.D.   On: 06/05/2015 15:50    ASSESSMENT AND PLAN: This is a very pleasant 65 years old white male with synchronous primary lung cancer including a stage IB in the right lower lobe and questionable stage IA in the left upper lobe diagnosed in February 2017. The patient has no evidence of metastatic disease to the brain. He has questionable right hilar lymphadenopathy that need close observation. PDL 1 expression is negative and the patient would not benefit from frontline immune therapy based on these results. The patient completed a course of curative radiotherapy to the enlarging lung mass. 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 I will start the patient on Medrol Dosepak. I recommended for him to continue on observation with repeat CT scan of the chest in 3 months. He was advised to call immediately if he has any concerning symptoms in the interval. The patient voices understanding of current disease status and treatment options and is in agreement with the current care plan.  All questions were answered. The patient knows to call the clinic with any problems, questions or concerns. We can certainly see the patient much sooner if necessary.  Disclaimer: This note was dictated with voice recognition software. Similar sounding words can inadvertently be  transcribed and may not be corrected upon review.

## 2015-06-12 NOTE — Telephone Encounter (Signed)
per pof to sch pt appt-gave pt copy of avs °

## 2015-06-12 NOTE — Progress Notes (Signed)
Oncology Nurse Navigator Documentation  Oncology Nurse Navigator Flowsheets 06/12/2015  Navigator Encounter Type Clinic/MDC  Patient Visit Type MedOnc  Treatment Phase Other  Barriers/Navigation Needs Coordination of Care  Interventions Coordination of Care  Coordination of Care Appts  Acuity Level 2  Acuity Level 2 Other  Time Spent with Patient 68   Spoke with Gerald Ho today.  He would like to change his appt for F/U scan until Sept.  Dr. Julien Nordmann aware and is ok with this.  I will update Rad Onc of patient's request and follow up on appt for scan and to see Dr. Julien Nordmann in Sept

## 2015-06-16 ENCOUNTER — Telehealth: Payer: Self-pay | Admitting: Radiation Oncology

## 2015-06-16 ENCOUNTER — Telehealth: Payer: Self-pay | Admitting: *Deleted

## 2015-06-16 NOTE — Telephone Encounter (Signed)
CALLED PATIENT TO INFORM THAT CT AND FU HAVE BEEN PUSHED OUT TO 09-2015, LVM FOR  A RETURN CALL

## 2015-06-16 NOTE — Telephone Encounter (Signed)
I called the pt to f/u since his appt with Dr. Julien Nordmann last week. He's doing so so since being seen with regards to his SOB. I encouraged him to keep [place] informed of his symptoms progressing as he's being treated for possible radiation pneumonitis. He requests bumping out his next CT scan from August to mid-September. I encouraged him to keep [place] informed of any progressive symptoms which would change this plan. Scheduling notified.

## 2015-06-26 ENCOUNTER — Telehealth: Payer: Self-pay | Admitting: Emergency Medicine

## 2015-06-26 ENCOUNTER — Telehealth: Payer: Self-pay | Admitting: Internal Medicine

## 2015-06-26 ENCOUNTER — Encounter: Payer: Self-pay | Admitting: *Deleted

## 2015-06-26 MED ORDER — UMECLIDINIUM-VILANTEROL 62.5-25 MCG/INH IN AEPB: 1.0000 | INHALATION_SPRAY | Freq: Every day | RESPIRATORY_TRACT | Status: DC

## 2015-06-26 NOTE — Telephone Encounter (Signed)
lvm to inform pt of r/s appt to 9/18 at 1030 am per 6/22 pof

## 2015-06-26 NOTE — Telephone Encounter (Signed)
Pharmacist states that pt needs a refill on Anoro.  This has been refilled.  Nothing further needed.

## 2015-06-26 NOTE — Progress Notes (Signed)
Oncology Nurse Navigator Documentation  Oncology Nurse Navigator Flowsheets 06/26/2015  Navigator Encounter Type Other  Treatment Phase Other  Barriers/Navigation Needs Coordination of Care  Interventions Coordination of Care  Coordination of Care Appts  Acuity Level 1  Time Spent with Patient 30   I noticed patient CT Chest is scheduled after patient is to have a follow up with Dr. Julien Nordmann.  I updated Dr. Julien Nordmann.  He request appt be changed to after the CT Chest is done.  I completed POF for scheduling to re-schedule after CT.

## 2015-07-01 ENCOUNTER — Other Ambulatory Visit: Payer: Self-pay | Admitting: *Deleted

## 2015-08-11 ENCOUNTER — Ambulatory Visit (HOSPITAL_COMMUNITY): Payer: BLUE CROSS/BLUE SHIELD

## 2015-08-12 ENCOUNTER — Ambulatory Visit: Payer: Self-pay | Admitting: Radiation Oncology

## 2015-08-15 ENCOUNTER — Inpatient Hospital Stay (HOSPITAL_COMMUNITY)
Admission: AD | Admit: 2015-08-15 | Discharge: 2015-08-22 | DRG: 190 | Disposition: A | Discharge status: Hospice | Payer: BLUE CROSS/BLUE SHIELD | Source: Other Acute Inpatient Hospital | Attending: Family Medicine | Admitting: Family Medicine

## 2015-08-15 ENCOUNTER — Inpatient Hospital Stay (HOSPITAL_COMMUNITY): Payer: BLUE CROSS/BLUE SHIELD

## 2015-08-15 ENCOUNTER — Telehealth: Payer: Self-pay | Admitting: Medical Oncology

## 2015-08-15 ENCOUNTER — Telehealth: Payer: Self-pay | Admitting: Emergency Medicine

## 2015-08-15 DIAGNOSIS — I4891 Unspecified atrial fibrillation: Secondary | ICD-10-CM | POA: Diagnosis not present

## 2015-08-15 DIAGNOSIS — F1010 Alcohol abuse, uncomplicated: Secondary | ICD-10-CM | POA: Diagnosis present

## 2015-08-15 DIAGNOSIS — R05 Cough: Secondary | ICD-10-CM

## 2015-08-15 DIAGNOSIS — J439 Emphysema, unspecified: Secondary | ICD-10-CM | POA: Diagnosis not present

## 2015-08-15 DIAGNOSIS — J9620 Acute and chronic respiratory failure, unspecified whether with hypoxia or hypercapnia: Secondary | ICD-10-CM

## 2015-08-15 DIAGNOSIS — Z66 Do not resuscitate: Secondary | ICD-10-CM | POA: Diagnosis not present

## 2015-08-15 DIAGNOSIS — I110 Hypertensive heart disease with heart failure: Secondary | ICD-10-CM | POA: Diagnosis not present

## 2015-08-15 DIAGNOSIS — D869 Sarcoidosis, unspecified: Secondary | ICD-10-CM

## 2015-08-15 DIAGNOSIS — K219 Gastro-esophageal reflux disease without esophagitis: Secondary | ICD-10-CM | POA: Diagnosis present

## 2015-08-15 DIAGNOSIS — Y842 Radiological procedure and radiotherapy as the cause of abnormal reaction of the patient, or of later complication, without mention of misadventure at the time of the procedure: Secondary | ICD-10-CM | POA: Diagnosis not present

## 2015-08-15 DIAGNOSIS — F17210 Nicotine dependence, cigarettes, uncomplicated: Secondary | ICD-10-CM | POA: Diagnosis not present

## 2015-08-15 DIAGNOSIS — R059 Cough, unspecified: Secondary | ICD-10-CM

## 2015-08-15 DIAGNOSIS — F319 Bipolar disorder, unspecified: Secondary | ICD-10-CM | POA: Diagnosis not present

## 2015-08-15 DIAGNOSIS — J440 Chronic obstructive pulmonary disease with acute lower respiratory infection: Principal | ICD-10-CM | POA: Diagnosis present

## 2015-08-15 DIAGNOSIS — E871 Hypo-osmolality and hyponatremia: Secondary | ICD-10-CM | POA: Diagnosis not present

## 2015-08-15 DIAGNOSIS — J9621 Acute and chronic respiratory failure with hypoxia: Secondary | ICD-10-CM | POA: Diagnosis present

## 2015-08-15 DIAGNOSIS — Y95 Nosocomial condition: Secondary | ICD-10-CM | POA: Diagnosis present

## 2015-08-15 DIAGNOSIS — I714 Abdominal aortic aneurysm, without rupture: Secondary | ICD-10-CM | POA: Diagnosis not present

## 2015-08-15 DIAGNOSIS — Z7901 Long term (current) use of anticoagulants: Secondary | ICD-10-CM | POA: Diagnosis not present

## 2015-08-15 DIAGNOSIS — J700 Acute pulmonary manifestations due to radiation: Secondary | ICD-10-CM | POA: Diagnosis present

## 2015-08-15 DIAGNOSIS — J449 Chronic obstructive pulmonary disease, unspecified: Secondary | ICD-10-CM | POA: Diagnosis present

## 2015-08-15 DIAGNOSIS — Z515 Encounter for palliative care: Secondary | ICD-10-CM | POA: Diagnosis present

## 2015-08-15 DIAGNOSIS — Z79899 Other long term (current) drug therapy: Secondary | ICD-10-CM

## 2015-08-15 DIAGNOSIS — J189 Pneumonia, unspecified organism: Secondary | ICD-10-CM | POA: Diagnosis not present

## 2015-08-15 DIAGNOSIS — I272 Other secondary pulmonary hypertension: Secondary | ICD-10-CM | POA: Diagnosis present

## 2015-08-15 DIAGNOSIS — E785 Hyperlipidemia, unspecified: Secondary | ICD-10-CM | POA: Diagnosis present

## 2015-08-15 DIAGNOSIS — I480 Paroxysmal atrial fibrillation: Secondary | ICD-10-CM | POA: Diagnosis not present

## 2015-08-15 DIAGNOSIS — C3431 Malignant neoplasm of lower lobe, right bronchus or lung: Secondary | ICD-10-CM | POA: Diagnosis present

## 2015-08-15 DIAGNOSIS — N179 Acute kidney failure, unspecified: Secondary | ICD-10-CM | POA: Diagnosis not present

## 2015-08-15 DIAGNOSIS — J9601 Acute respiratory failure with hypoxia: Secondary | ICD-10-CM | POA: Diagnosis not present

## 2015-08-15 DIAGNOSIS — R0600 Dyspnea, unspecified: Secondary | ICD-10-CM | POA: Diagnosis not present

## 2015-08-15 DIAGNOSIS — Z7189 Other specified counseling: Secondary | ICD-10-CM

## 2015-08-15 DIAGNOSIS — R0902 Hypoxemia: Secondary | ICD-10-CM

## 2015-08-15 DIAGNOSIS — C3490 Malignant neoplasm of unspecified part of unspecified bronchus or lung: Secondary | ICD-10-CM | POA: Diagnosis present

## 2015-08-15 DIAGNOSIS — L538 Other specified erythematous conditions: Secondary | ICD-10-CM

## 2015-08-15 DIAGNOSIS — I5022 Chronic systolic (congestive) heart failure: Secondary | ICD-10-CM | POA: Diagnosis present

## 2015-08-15 DIAGNOSIS — J9690 Respiratory failure, unspecified, unspecified whether with hypoxia or hypercapnia: Secondary | ICD-10-CM

## 2015-08-15 DIAGNOSIS — Z9981 Dependence on supplemental oxygen: Secondary | ICD-10-CM | POA: Diagnosis not present

## 2015-08-15 DIAGNOSIS — Z720 Tobacco use: Secondary | ICD-10-CM | POA: Diagnosis present

## 2015-08-15 LAB — CBC WITH DIFFERENTIAL/PLATELET
BASOS ABS: 0 10*3/uL (ref 0.0–0.1)
BASOS PCT: 0 %
EOS ABS: 0 10*3/uL (ref 0.0–0.7)
EOS PCT: 0 %
HCT: 38.2 % — ABNORMAL LOW (ref 39.0–52.0)
Hemoglobin: 12.8 g/dL — ABNORMAL LOW (ref 13.0–17.0)
Lymphocytes Relative: 11 %
Lymphs Abs: 0.4 10*3/uL — ABNORMAL LOW (ref 0.7–4.0)
MCH: 34.2 pg — ABNORMAL HIGH (ref 26.0–34.0)
MCHC: 33.5 g/dL (ref 30.0–36.0)
MCV: 102.1 fL — ABNORMAL HIGH (ref 78.0–100.0)
MONO ABS: 0.1 10*3/uL (ref 0.1–1.0)
MONOS PCT: 2 %
NEUTROS ABS: 2.7 10*3/uL (ref 1.7–7.7)
Neutrophils Relative %: 87 %
PLATELETS: 204 10*3/uL (ref 150–400)
RBC: 3.74 MIL/uL — ABNORMAL LOW (ref 4.22–5.81)
RDW: 14.4 % (ref 11.5–15.5)
WBC: 3.1 10*3/uL — ABNORMAL LOW (ref 4.0–10.5)

## 2015-08-15 LAB — POCT I-STAT 3, ART BLOOD GAS (G3+)
Acid-base deficit: 5 mmol/L — ABNORMAL HIGH (ref 0.0–2.0)
Bicarbonate: 19.6 mEq/L — ABNORMAL LOW (ref 20.0–24.0)
O2 SAT: 95 %
PCO2 ART: 35.2 mmHg (ref 35.0–45.0)
PO2 ART: 79 mmHg — AB (ref 80.0–100.0)
Patient temperature: 97.8
TCO2: 21 mmol/L (ref 0–100)
pH, Arterial: 7.352 (ref 7.350–7.450)

## 2015-08-15 LAB — TYPE AND SCREEN
ABO/RH(D): O POS
Antibody Screen: NEGATIVE

## 2015-08-15 LAB — LACTIC ACID, PLASMA
LACTIC ACID, VENOUS: 1.5 mmol/L (ref 0.5–1.9)
LACTIC ACID, VENOUS: 1.9 mmol/L (ref 0.5–1.9)

## 2015-08-15 LAB — COMPREHENSIVE METABOLIC PANEL
ALT: 14 U/L — ABNORMAL LOW (ref 17–63)
ANION GAP: 10 (ref 5–15)
AST: 16 U/L (ref 15–41)
Albumin: 3.7 g/dL (ref 3.5–5.0)
Alkaline Phosphatase: 80 U/L (ref 38–126)
BUN: 5 mg/dL — AB (ref 6–20)
CHLORIDE: 99 mmol/L — AB (ref 101–111)
CO2: 20 mmol/L — ABNORMAL LOW (ref 22–32)
Calcium: 8.8 mg/dL — ABNORMAL LOW (ref 8.9–10.3)
Creatinine, Ser: 1.05 mg/dL (ref 0.61–1.24)
GFR calc Af Amer: >60 mL/min (ref >60)
Glucose, Bld: 151 mg/dL — ABNORMAL HIGH (ref 65–99)
POTASSIUM: 4.4 mmol/L (ref 3.5–5.1)
Sodium: 129 mmol/L — ABNORMAL LOW (ref 135–145)
TOTAL PROTEIN: 6.7 g/dL (ref 6.5–8.1)
Total Bilirubin: 1.3 mg/dL — ABNORMAL HIGH (ref 0.3–1.2)

## 2015-08-15 LAB — MAGNESIUM: MAGNESIUM: 1.4 mg/dL — AB (ref 1.7–2.4)

## 2015-08-15 LAB — PROTIME-INR
INR: 1.83
PROTHROMBIN TIME: 21.4 s — AB (ref 11.4–15.2)

## 2015-08-15 LAB — PHOSPHORUS: PHOSPHORUS: 3.4 mg/dL (ref 2.5–4.6)

## 2015-08-15 LAB — ABO/RH: ABO/RH(D): O POS

## 2015-08-15 LAB — GLUCOSE, CAPILLARY: Glucose-Capillary: 162 mg/dL — ABNORMAL HIGH (ref 65–99)

## 2015-08-15 LAB — MRSA PCR SCREENING: MRSA by PCR: NEGATIVE

## 2015-08-15 LAB — BRAIN NATRIURETIC PEPTIDE: B NATRIURETIC PEPTIDE 5: 1419.2 pg/mL — AB (ref 0.0–100.0)

## 2015-08-15 LAB — TROPONIN I: TROPONIN I: 0.03 ng/mL — AB (ref <0.03)

## 2015-08-15 MED ORDER — PANTOPRAZOLE SODIUM 40 MG PO TBEC
40.0000 mg | DELAYED_RELEASE_TABLET | Freq: Every day | ORAL | Status: DC
  Administered 2015-08-15 – 2015-08-22 (×8): 40 mg via ORAL
  Filled 2015-08-15 (×8): qty 1

## 2015-08-15 MED ORDER — LOSARTAN POTASSIUM 50 MG PO TABS: 50.0000 mg | ORAL_TABLET | Freq: Every day | ORAL | Status: DC

## 2015-08-15 MED ORDER — PIPERACILLIN-TAZOBACTAM 3.375 G IVPB 30 MIN
3.3750 g | Freq: Once | INTRAVENOUS | Status: AC
  Administered 2015-08-15: 3.375 g via INTRAVENOUS
  Filled 2015-08-15: qty 50

## 2015-08-15 MED ORDER — LAMOTRIGINE 200 MG PO TABS: 200.0000 mg | ORAL_TABLET | Freq: Every day | ORAL | Status: DC

## 2015-08-15 MED ORDER — VANCOMYCIN HCL 10 G IV SOLR
2000.0000 mg | Freq: Once | INTRAVENOUS | Status: AC
  Administered 2015-08-15: 2000 mg via INTRAVENOUS
  Filled 2015-08-15: qty 2000

## 2015-08-15 MED ORDER — PIPERACILLIN-TAZOBACTAM 3.375 G IVPB
3.3750 g | Freq: Three times a day (TID) | INTRAVENOUS | Status: DC
  Administered 2015-08-16 – 2015-08-17 (×5): 3.375 g via INTRAVENOUS
  Filled 2015-08-15 (×6): qty 50

## 2015-08-15 MED ORDER — LOSARTAN POTASSIUM 50 MG PO TABS
50.0000 mg | ORAL_TABLET | Freq: Every day | ORAL | Status: DC
  Administered 2015-08-15 – 2015-08-22 (×8): 50 mg via ORAL
  Filled 2015-08-15 (×8): qty 1

## 2015-08-15 MED ORDER — HEPARIN SODIUM (PORCINE) 5000 UNIT/ML IJ SOLN
5000.0000 [IU] | Freq: Three times a day (TID) | INTRAMUSCULAR | Status: DC
  Administered 2015-08-15 – 2015-08-16 (×2): 5000 [IU] via SUBCUTANEOUS
  Filled 2015-08-15 (×3): qty 1

## 2015-08-15 MED ORDER — LABETALOL HCL 5 MG/ML IV SOLN
5.0000 mg | Freq: Once | INTRAVENOUS | Status: AC
  Administered 2015-08-15: 5 mg via INTRAVENOUS
  Filled 2015-08-15: qty 4

## 2015-08-15 MED ORDER — INSULIN ASPART 100 UNIT/ML ~~LOC~~ SOLN
0.0000 [IU] | Freq: Three times a day (TID) | SUBCUTANEOUS | Status: DC
  Administered 2015-08-16: 3 [IU] via SUBCUTANEOUS
  Administered 2015-08-16 – 2015-08-18 (×6): 2 [IU] via SUBCUTANEOUS
  Administered 2015-08-19 (×3): 3 [IU] via SUBCUTANEOUS
  Administered 2015-08-20: 5 [IU] via SUBCUTANEOUS
  Administered 2015-08-20: 3 [IU] via SUBCUTANEOUS
  Administered 2015-08-20 – 2015-08-21 (×2): 2 [IU] via SUBCUTANEOUS
  Administered 2015-08-21: 3 [IU] via SUBCUTANEOUS
  Administered 2015-08-21 – 2015-08-22 (×2): 2 [IU] via SUBCUTANEOUS

## 2015-08-15 MED ORDER — CHLORHEXIDINE GLUCONATE 0.12 % MT SOLN
15.0000 mL | Freq: Two times a day (BID) | OROMUCOSAL | Status: DC
  Administered 2015-08-15 – 2015-08-20 (×7): 15 mL via OROMUCOSAL

## 2015-08-15 MED ORDER — VANCOMYCIN HCL IN DEXTROSE 750-5 MG/150ML-% IV SOLN
750.0000 mg | Freq: Two times a day (BID) | INTRAVENOUS | Status: DC
  Filled 2015-08-15 (×2): qty 150

## 2015-08-15 MED ORDER — IPRATROPIUM BROMIDE 0.02 % IN SOLN
0.5000 mg | RESPIRATORY_TRACT | Status: DC
  Administered 2015-08-15 (×2): 0.5 mg via RESPIRATORY_TRACT
  Filled 2015-08-15 (×2): qty 2.5

## 2015-08-15 MED ORDER — LAMOTRIGINE 100 MG PO TABS
100.0000 mg | ORAL_TABLET | Freq: Every day | ORAL | Status: DC
  Administered 2015-08-15 – 2015-08-22 (×8): 100 mg via ORAL
  Filled 2015-08-15 (×8): qty 1

## 2015-08-15 MED ORDER — CARVEDILOL 12.5 MG PO TABS
12.5000 mg | ORAL_TABLET | Freq: Two times a day (BID) | ORAL | Status: DC
  Administered 2015-08-15 – 2015-08-18 (×6): 12.5 mg via ORAL
  Filled 2015-08-15 (×7): qty 1

## 2015-08-15 MED ORDER — QUETIAPINE FUMARATE 100 MG PO TABS
100.0000 mg | ORAL_TABLET | Freq: Every day | ORAL | Status: DC
  Filled 2015-08-15: qty 1

## 2015-08-15 MED ORDER — CETYLPYRIDINIUM CHLORIDE 0.05 % MT LIQD
7.0000 mL | Freq: Two times a day (BID) | OROMUCOSAL | Status: DC
  Administered 2015-08-16 – 2015-08-22 (×9): 7 mL via OROMUCOSAL

## 2015-08-15 MED ORDER — QUETIAPINE FUMARATE 50 MG PO TABS
50.0000 mg | ORAL_TABLET | Freq: Every day | ORAL | Status: DC
  Administered 2015-08-15 – 2015-08-17 (×3): 50 mg via ORAL
  Filled 2015-08-15 (×4): qty 1

## 2015-08-15 MED ORDER — ALBUTEROL SULFATE (2.5 MG/3ML) 0.083% IN NEBU
2.5000 mg | INHALATION_SOLUTION | RESPIRATORY_TRACT | Status: DC
  Administered 2015-08-15 (×2): 2.5 mg via RESPIRATORY_TRACT
  Filled 2015-08-15 (×2): qty 3

## 2015-08-15 MED ORDER — LAMOTRIGINE 200 MG PO TABS
200.0000 mg | ORAL_TABLET | Freq: Every day | ORAL | Status: DC
  Filled 2015-08-15: qty 1

## 2015-08-15 MED ORDER — PRAVASTATIN SODIUM 20 MG PO TABS
20.0000 mg | ORAL_TABLET | Freq: Every day | ORAL | Status: DC
  Administered 2015-08-16 – 2015-08-22 (×7): 20 mg via ORAL
  Filled 2015-08-15 (×7): qty 1

## 2015-08-15 MED ORDER — FUROSEMIDE 10 MG/ML IJ SOLN
40.0000 mg | Freq: Once | INTRAMUSCULAR | Status: AC
  Administered 2015-08-15: 40 mg via INTRAVENOUS
  Filled 2015-08-15: qty 4

## 2015-08-15 MED ORDER — INSULIN ASPART 100 UNIT/ML ~~LOC~~ SOLN
0.0000 [IU] | Freq: Every day | SUBCUTANEOUS | Status: DC
  Administered 2015-08-19 – 2015-08-21 (×2): 2 [IU] via SUBCUTANEOUS

## 2015-08-15 MED ORDER — LAMOTRIGINE 100 MG PO TABS: 100.0000 mg | ORAL_TABLET | Freq: Every day | ORAL | Status: DC

## 2015-08-15 MED ORDER — SODIUM CHLORIDE 0.9 % IV SOLN
250.0000 mL | INTRAVENOUS | Status: DC | PRN
  Administered 2015-08-15: 250 mL via INTRAVENOUS

## 2015-08-15 MED ORDER — IPRATROPIUM-ALBUTEROL 0.5-2.5 (3) MG/3ML IN SOLN
3.0000 mL | RESPIRATORY_TRACT | Status: DC
  Administered 2015-08-16 – 2015-08-18 (×14): 3 mL via RESPIRATORY_TRACT
  Filled 2015-08-15 (×13): qty 3

## 2015-08-15 NOTE — Progress Notes (Addendum)
Pharmacy Antibiotic Note  Gerald Ho is a 65 y.o. male admitted on 08/15/2015 due to extreme SOB. Found to be septic. Pharmacy has been consulted for vancomycin and Zosyn dosing.  Day #1 of abx for sepsis. Afebrile, no labs currently but CBC and Cmet ordered.  Plan: Give Zosyn 3.375g IV (30 min infusion) x 1 Give vancomycin 2g IV x 1 Follow up labs for further dosing    Temp (24hrs), Avg:97.8 F (36.6 C), Min:97.8 F (36.6 C), Max:97.8 F (36.6 C)  No results for input(s): WBC, CREATININE, LATICACIDVEN, VANCOTROUGH, VANCOPEAK, VANCORANDOM, GENTTROUGH, GENTPEAK, GENTRANDOM, TOBRATROUGH, TOBRAPEAK, TOBRARND, AMIKACINPEAK, AMIKACINTROU, AMIKACIN in the last 168 hours.  CrCl cannot be calculated (Unknown ideal weight.).    No Known Allergies  Antimicrobials this admission: Zosyn 8/11 >>  Vancomycin 8/11 >>  Dose adjustments this admission: n/a  Microbiology results: 8/11 BCx: sent 8/11 UCx: sent  8/11 Sputum: sent  8/11 MRSA PCR: sent  Thank you for allowing pharmacy to be a part of this patient's care.  Elenor Quinones, PharmD, BCPS Clinical Pharmacist Pager [phone] 08/15/2015 7:52 PM   ADDENDUM:  SCr 1.05, normalized CrCL ~69m/min. WBC low at 3.1.  Plan: Start Zosyn 3.375 gm IV q8h (4 hour infusion) Start vancomycin '750mg'$  IV Q12 tomorrow Monitor clinical picture, renal function, VT prn F/U C&S, abx deescalation / LOT  NElenor Quinones PharmD, BCPS Clinical Pharmacist Pager [phone]/11/2015 9:15 PM

## 2015-08-15 NOTE — Telephone Encounter (Signed)
Thank you :)

## 2015-08-15 NOTE — H&P (Signed)
PULMONARY / CRITICAL CARE MEDICINE   Name: Gerald Ho MRN: [id number] DOB: Mar 14, 1950    ADMISSION DATE:  08/15/2015  CHIEF COMPLAINT:  Dyspnea  HISTORY OF PRESENT ILLNESS:   Mr. Breeding is an 65 y.o. 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 He also had a nodule in the LUL which was treated concurrently as there was concern for synchronous primary lung CA. He has had some dyspnea since his radiation completed in June, but a CT at that time was not suggestive of radiation-induced lung injury. 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  PAST MEDICAL HISTORY :  He  has a past medical history of Abdominal aortic aneurysm (AAA) ([place]) (03/07/2015); Bipolar disorder ([place]); Current smoker; Dysrhythmia; Emphysema; GERD (gastroesophageal reflux disease); High blood pressure; Hyperlipemia; Non-small cell carcinoma of lung, stage 1 ([place]) (02/24/2015); Pneumonia; and Shortness of breath dyspnea.  PAST SURGICAL HISTORY: He  has a past surgical history that includes Lumbar disc surgery; Cardioversion (N/A, 02/26/2014); Colonoscopy w/ polypectomy; Upper gi endoscopy; Thumb fusion (Right); Video bronchoscopy with endobronchial navigation (N/A, 01/29/2015); and Video bronchoscopy with endobronchial ultrasound (N/A, 01/29/2015).  No Known Allergies  No current facility-administered medications on file prior to encounter.    Current Outpatient Prescriptions on File Prior to Encounter  Medication Sig  . albuterol (PROVENTIL HFA;VENTOLIN HFA) 108 (90 BASE) MCG/ACT inhaler Inhale 2 puffs into the lungs every 6 (six) hours as needed for wheezing or shortness of breath.  . carvedilol (COREG) 12.5 MG tablet Take 1 tablet (12.5 mg total) by mouth 2 (two) times daily.  Marland Kitchen lamoTRIgine (LAMICTAL) 200 MG tablet Take 200 mg by mouth daily at  12 noon.   Marland Kitchen losartan (COZAAR) 50 MG tablet Take 50 mg by mouth daily at 12 noon.   . methylPREDNISolone (MEDROL DOSEPAK) 4 MG TBPK tablet Use as instructed  . pravastatin (PRAVACHOL) 20 MG tablet Take 20 mg by mouth daily at 12 noon.   . QUEtiapine (SEROQUEL) 100 MG tablet Take 100 mg by mouth at bedtime.   . RABEprazole (ACIPHEX) 20 MG tablet Take 20 mg by mouth daily at 12 noon.   . rivaroxaban (XARELTO) 20 MG TABS tablet Take 1 tablet (20 mg total) by mouth daily with supper.  . umeclidinium-vilanterol (ANORO ELLIPTA) 62.5-25 MCG/INH AEPB Inhale 1 puff into the lungs daily.    FAMILY HISTORY:  His indicated that his mother is deceased. He indicated that his father is deceased. He indicated that his maternal grandmother is deceased. He indicated that his maternal grandfather is deceased. He indicated that his paternal grandmother is deceased. He indicated that his paternal grandfather is deceased.    SOCIAL HISTORY: He  reports that he has been smoking Cigarettes.  He has a 10.00 pack-year smoking history. His smokeless tobacco use includes Chew. He reports that he drinks about 2.4 oz of alcohol per week . He reports that he uses drugs.  REVIEW OF SYSTEMS:   As her HPI.  SUBJECTIVE:  Pt developed worsening dyspnea and hypoxia over several weeks.  VITAL SIGNS: Pulse 91   Temp 97.8 F (36.6 C) (Oral)   Resp (!) 25   SpO2 94%   HEMODYNAMICS:    VENTILATOR SETTINGS: FiO2 (%):  [50 %] 50 %  INTAKE / OUTPUT: No intake/output data recorded.  PHYSICAL EXAMINATION: General:  Moderate respiratory distress, improved w/  BiPAP Neuro:  Awake, alert, fully oriented. HEENT:  MMM Cardiovascular:  Elevated JVP, tachycardic Lungs: bibasilar crackles Abdomen:  Obese abdomen Musculoskeletal:  Moderate clubbing noted Skin:  Palmar erythema  LABS:  BMET No results for input(s): NA, K, CL, CO2, BUN, CREATININE, GLUCOSE in the last 168 hours.  Electrolytes No results for input(s):  CALCIUM, MG, PHOS in the last 168 hours.  CBC No results for input(s): WBC, HGB, HCT, PLT in the last 168 hours.  Coag's No results for input(s): APTT, INR in the last 168 hours.  Sepsis Markers No results for input(s): LATICACIDVEN, PROCALCITON, O2SATVEN in the last 168 hours.  ABG No results for input(s): PHART, PCO2ART, PO2ART in the last 168 hours.  Liver Enzymes No results for input(s): AST, ALT, ALKPHOS, BILITOT, ALBUMIN in the last 168 hours.  Cardiac Enzymes No results for input(s): TROPONINI, PROBNP in the last 168 hours.  Glucose No results for input(s): GLUCAP in the last 168 hours.  Imaging No results found.  STUDIES:  CXR: Bibasilar prominent interstitial pattern  CULTURES: Blood 8/11 >> In process Urine 8/11 >> In process Lower Resp 8/11>> In process  ANTIBIOTICS: Vanc 8/11 >> Zosyn 8/11 >>  SIGNIFICANT EVENTS: BiPAP for dyspnea and hypoxia  LINES/TUBES: PIVs  DISCUSSION: 65 y/o M w/ hx of Tobacco and EtOH use, lung CA s/p stereotactic rads. P/w dyspnea and hypoxia and volume overload.  ASSESSMENT / PLAN:  PULMONARY A: Hypoxic Respiratory failure Severe COPD Lung CA, s/p curative rads P:   Suspect pulmonary edema due to CHF, but possible radiation pneumonitis. PE unlikely as on AC. - Tx with lasix 40 mg IV x1 now, re-dose tomorrow based on response. - Repeat CT of chest once volume status normalized - Hold off of treatment for radiation pneumonitis (steroids) for now, consider if not responding to lasix tx. - Ipratroprium / albuterol nebs q4.  CARDIOVASCULAR A:  Hx of AF CHF AAA Hypertension P:  Hold xarelto for now, consider restarting LMWH or DOAC once any possibility of procedure is negated. S/p lasix 40 mg IV x1 Continue home anti-hypertensives for afterload reduction.  RENAL A:   Hyponatremia Hypomagnesmia P:   Hyponatremia may be related to beer intake, but possibly related to volume overload as well. Will trend with  diuresis.  GASTROINTESTINAL A:   Possible liver disease (palmar erythema, elev. Bilirubin / INR) P:   INR may be elevated due to xarelto. Obtain RUQ [place].  HEMATOLOGIC A:   Anticoagulated at home P:  For AF. Hold for now  INFECTIOUS A:   Possible PNA P:   Unlikely given normal WBC, lack of fever. Continue vanc/zosyn, but plan to stop tomorrow if no s/sx of infection.  ENDOCRINE A:   Hyponatremia   P:   Likely due to as above  NEUROLOGIC A:   At risk for EtOH w/d P:   CIWA ordered, but will hold off on ordering tx.  FAMILY  - Updates: Daughter updated on admission  - Inter-disciplinary family meet or Palliative Care meeting due by:  8/18  CRITICAL CARE Performed by: Luz Brazen  Total critical care time: 120 minutes  Critical care time was exclusive of separately billable procedures and treating other patients.  Critical care was necessary to treat or prevent imminent or life-threatening deterioration.  Critical care was time spent personally by me on the following activities: development of treatment plan with patient and/or surrogate as well as nursing, discussions with consultants, evaluation of patient's response to treatment, examination of patient,  obtaining history from patient or surrogate, ordering and performing treatments and interventions, ordering and review of laboratory studies, ordering and review of radiographic studies, pulse oximetry and re-evaluation of patient's condition.  Luz Brazen Pulmonary and [place] Pager: [phone]  08/15/2015, 8:13 PM

## 2015-08-15 NOTE — Telephone Encounter (Signed)
Called Gerald Ho at Dr. Lew Dawes office.  Left message for her to return call.

## 2015-08-15 NOTE — Telephone Encounter (Signed)
Pt called this am reporting that he is using 10 liters of oxygen to keep his o2 sat up to 92%. He is extremely SOB on the phone . His PCP changed his inhalers earlier in the week. He is using a urinal now to void because he cannot walk 10 feet to the bathroom due to dyspnea. I instructed him to go to the ED and he will get someone to take him. He prefers to come to [facility] ED. Daughter notified of events. Information reported to Dr Lamonte Sakai staff.

## 2015-08-15 NOTE — Telephone Encounter (Signed)
Pt now at [facility] ED getting nebulizer treatment, sats up to 90% on face mask .

## 2015-08-15 NOTE — Progress Notes (Signed)
CRITICAL VALUE ALERT  Critical value received:  Trop-0.03  Date of notification:  08/15/2015  Time of notification:  2015   Critical value read back:Yes.    Nurse who received alert:  Dillard Essex  MD notified (1st page):  Dr. Ancil Linsey  Time of first page:  2015  MD notified (2nd page):  Time of second page:  Responding MD:  Dr. Ancil Linsey  Time MD responded:  2015

## 2015-08-15 NOTE — Telephone Encounter (Signed)
Diane states that patient's breathing is really bad.  He is using 10L of oxygen to get Sats up to 90%.  Went to PCP earlier this week, they switched his inhaler.  Patient states that his O2 has dropped into low 70's. Patient states that even on 10L, his oxygen is 88% right now at rest.  When he gets up to go to the restroom, his oxygen drops to low 70's.  Diane from Dr. Lew Dawes office advised patient to go to the hospital.  I agreed with her that patient needs to go to the hospital.  I called the patient and spoke with him directly, he sounded very winded and raspy on the phone. He refuses to go by EMS even though I advised him that he needed to call EMS so they can monitor him during the commute.  Patient stated that they would take him to [facility] and he does not want to go there.  He said it takes him 30 min to get to [place] from his home and he is going to ride with his brother in law to [facility] instead.  Advised him that the ambulance can take him to [facility] if that is where he prefers.  He said it would cost him over $900 for that and he will just wait for his brother in law to come get him.  He said his brother in law is 30 minutes away.  Advised him that I didn't think it was a good idea to wait this long and he said that he has been on the 10L for 2 days now.  Asked him why he did not tell us this until today and he said that his oxygen was staying between 87% and 90% on the 10L.  No matter how persistently I advised him to call EMS, patient refused and said that he will go to the hospital, but he will ride via car to [facility].     Will send to Dr. Lamonte Sakai as Juluis Rainier

## 2015-08-16 ENCOUNTER — Inpatient Hospital Stay (HOSPITAL_COMMUNITY): Payer: BLUE CROSS/BLUE SHIELD

## 2015-08-16 DIAGNOSIS — R0600 Dyspnea, unspecified: Secondary | ICD-10-CM

## 2015-08-16 DIAGNOSIS — J9601 Acute respiratory failure with hypoxia: Secondary | ICD-10-CM

## 2015-08-16 DIAGNOSIS — J9620 Acute and chronic respiratory failure, unspecified whether with hypoxia or hypercapnia: Secondary | ICD-10-CM | POA: Diagnosis present

## 2015-08-16 LAB — ECHOCARDIOGRAM COMPLETE
FS: 19 % — AB (ref 28–44)
Height: 72 in
IVS/LV PW RATIO, ED: 0.96
LA ID, A-P, ES: 49 mm
LA diam end sys: 49 mm
LA diam index: 2.17 cm/m2
LA vol A4C: 55.2 ml
LA vol index: 28.2 mL/m2
LA vol: 63.7 mL
LV PW d: 11.2 mm — AB (ref 0.6–1.1)
LVOT area: 4.52 cm2
LVOT diameter: 24 mm
Lateral S' vel: 9.5 cm/s
TAPSE: 15.2 mm
Weight: 3675.51 oz

## 2015-08-16 LAB — GLUCOSE, CAPILLARY
GLUCOSE-CAPILLARY: 148 mg/dL — AB (ref 65–99)
GLUCOSE-CAPILLARY: 161 mg/dL — AB (ref 65–99)
GLUCOSE-CAPILLARY: 200 mg/dL — AB (ref 65–99)
Glucose-Capillary: 146 mg/dL — ABNORMAL HIGH (ref 65–99)

## 2015-08-16 LAB — TROPONIN I
Troponin I: 0.04 ng/mL (ref <0.03)
Troponin I: 0.03 ng/mL (ref <0.03)

## 2015-08-16 MED ORDER — CHLORHEXIDINE GLUCONATE 0.12 % MT SOLN: 15.0000 mL | Freq: Two times a day (BID) | OROMUCOSAL | Status: DC

## 2015-08-16 MED ORDER — CETYLPYRIDINIUM CHLORIDE 0.05 % MT LIQD: 7.0000 mL | Freq: Two times a day (BID) | OROMUCOSAL | Status: DC

## 2015-08-16 MED ORDER — FUROSEMIDE 10 MG/ML IJ SOLN
20.0000 mg | Freq: Once | INTRAMUSCULAR | Status: AC
  Administered 2015-08-16: 20 mg via INTRAVENOUS
  Filled 2015-08-16: qty 2

## 2015-08-16 MED ORDER — METHYLPREDNISOLONE SODIUM SUCC 125 MG IJ SOLR
80.0000 mg | Freq: Three times a day (TID) | INTRAMUSCULAR | Status: DC
  Administered 2015-08-16 – 2015-08-17 (×4): 80 mg via INTRAVENOUS
  Filled 2015-08-16 (×3): qty 1.28
  Filled 2015-08-16: qty 2

## 2015-08-16 MED ORDER — MAGNESIUM SULFATE 2 GM/50ML IV SOLN
2.0000 g | Freq: Once | INTRAVENOUS | Status: AC
  Administered 2015-08-16: 2 g via INTRAVENOUS
  Filled 2015-08-16: qty 50

## 2015-08-16 MED ORDER — ENOXAPARIN SODIUM 100 MG/ML ~~LOC~~ SOLN
100.0000 mg | Freq: Two times a day (BID) | SUBCUTANEOUS | Status: DC
  Administered 2015-08-16 – 2015-08-20 (×9): 100 mg via SUBCUTANEOUS
  Filled 2015-08-16 (×9): qty 1

## 2015-08-16 NOTE — Progress Notes (Signed)
PULMONARY / CRITICAL CARE MEDICINE   Name: Gerald Ho MRN: [id number] DOB: [birth year]    ADMISSION DATE:  08/15/2015  CHIEF COMPLAINT:  Dyspnea  BRIEF  Gerald Ho is an 65 y.o. 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 He also had a nodule in the LUL which was treated concurrently as there was concern for synchronous primary lung CA. He has had some dyspnea since his radiation completed in June, but a CT at that time was not suggestive of radiation-induced lung injury. 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   STUDIES:  CXR: Bibasilar prominent interstitial pattern  CULTURES: Blood 8/11 >> In process Urine 8/11 >> In process Lower Resp 8/11>> In process  ANTIBIOTICS: Vanc 8/11 >> Zosyn 8/11 >>    LINES/TUBES: PIVs   EVENTS 08/15/2015  -  developed worsening dyspnea and hypoxia over several weeks.BiPAP for dyspnea and hypoxia    SUBJECTIVE/OVERNIGHT/INTERVAL HX 08/16/15: very bipap dependent ; desats easy without it.  No evidence of etoh wd so far. Wants to eat   VITAL SIGNS: BP (!) 146/94   Pulse (!) 55   Temp 97.7 F (36.5 C) (Oral)   Resp (!) 23   Ht 6' (1.829 m)   Wt 104.2 kg (229 lb 11.5 oz)   SpO2 93%   BMI 31.16 kg/m   HEMODYNAMICS:    VENTILATOR SETTINGS: FiO2 (%):  [40 %-60 %] 60 %  INTAKE / OUTPUT: I/O last 3 completed shifts: In: 620 [I.V.:20; IV Piggyback:600] Out: 7026 [Urine:3525]  PHYSICAL EXAMINATION: General:  Mild respiratory distress, on iPAP Neuro:  Awake, alert, fully oriented. HEENT:  MMM Cardiovascular:  Elevated JVP, tachycardic Lungs: bibasilar crackles Abdomen:  Obese abdomen Musculoskeletal:  Moderate clubbing noted Skin:  Palmar erythema  LABS:  PULMONARY  Recent Labs Lab 08/15/15 2021  PHART 7.352  PCO2ART 35.2  PO2ART  79.0*  HCO3 19.6*  TCO2 21  O2SAT 95.0    CBC  Recent Labs Lab 08/15/15 1946  HGB 12.8*  HCT 38.2*  WBC 3.1*  PLT 204    COAGULATION  Recent Labs Lab 08/15/15 1946  INR 1.83    CARDIAC   Recent Labs Lab 08/15/15 1946 08/16/15 0138  TROPONINI 0.03* 0.04*   No results for input(s): PROBNP in the last 168 hours.   CHEMISTRY  Recent Labs Lab 08/15/15 1946  NA 129*  K 4.4  CL 99*  CO2 20*  GLUCOSE 151*  BUN 5*  CREATININE 1.05  CALCIUM 8.8*  MG 1.4*  PHOS 3.4   Estimated Creatinine Clearance: 88.7 mL/min (by C-G formula based on SCr of 1.05 mg/dL).   LIVER  Recent Labs Lab 08/15/15 1946  AST 16  ALT 14*  ALKPHOS 80  BILITOT 1.3*  PROT 6.7  ALBUMIN 3.7  INR 1.83     INFECTIOUS  Recent Labs Lab 08/15/15 1946 08/15/15 2304  LATICACIDVEN 1.5 1.9     ENDOCRINE CBG (last 3)   Recent Labs  08/15/15 2221 08/16/15 0840  GLUCAP 162* 148*         IMAGING x48h  - image(s) personally visualized  -   highlighted in bold Dg Chest Port 1 View  Result Date: 08/15/2015 CLINICAL DATA:  65 year old male with cough. Lung cancer. Abnormal right lung and hilum since chest CT 06/05/2015. EXAM: PORTABLE CHEST 1 VIEW COMPARISON:  Radiographs  08/15/2015 and earlier. FINDINGS: Portable AP semi upright view at 2017 hours. Stable lung volumes. Stable cardiomegaly and mediastinal contours. Continued asymmetric increased opacity at the right hilum. Bilateral basilar predominant increased interstitial markings are stable. No pneumothorax or pleural effusion. No new pulmonary opacity. IMPRESSION: No new cardiopulmonary abnormality. Stable abnormal right lung and hilar findings as described yesterday and by CT on 06/05/2015. Electronically Signed   By: Genevie Ann M.D.   On: 08/15/2015 20:33   US Abdomen Limited Ruq  Result Date: 08/15/2015 CLINICAL DATA:  Initial evaluation for palmar erythema, concern for cirrhosis. EXAM: US ABDOMEN LIMITED - RIGHT UPPER  QUADRANT COMPARISON:  None. FINDINGS: Gallbladder: Several echogenic stones present within the gallbladder lumen, largest wedged measured approximately 2 mm. Gallbladder sludge present. Gallbladder wall measured at the upper limits of normal at 3 mm. No free pericholecystic fluid. No sonographic Murphy's sign was not exam, although the patient was medicated. Common bile duct: Diameter: 4.2 mm. Liver: No focal lesion identified. Diffusely increased echogenicity, most commonly associated with steatosis. No obvious changes related to cirrhosis identified. IMPRESSION: 1. Cholelithiasis with gallbladder sludge without sonographic evidence for acute cholecystitis. No biliary dilatation. 2. Increased and somewhat coarse echogenicity within the hepatic parenchyma. While this finding is often related to hepatic steatosis, possible early cirrhotic changes could also have this appearance. Correlation with the laboratory values recommended. Electronically Signed   By: Jeannine Boga M.D.   On: 08/15/2015 22:40       DISCUSSION: 65 y/o M w/ hx of Tobacco and EtOH use, lung CA s/p stereotactic rads. P/w dyspnea and hypoxia and volume overload.  ASSESSMENT / PLAN:  PULMONARY A: Hypoxic Respiratory failure Severe COPD Lung CA, s/p curative rads    - 08/16/2015 - mild improvement only ; bipap depdendennt but no wheze. S/p lasix x 1. Doubt PE - he was on xarelto P:   - Repeat CT of chest  - rule out XRT pneumonitis - start steroids or radiation pneumonitis (steroids) v aECOPD. - Ipratroprium / albuterol nebs q4.  CARDIOVASCULAR A:  Hx of AF -compliant per daughter with xarelto CHF - chronic systolic ef 01% in 7510 AAA Hypertension  - nil acute  P:  Start LMWH in case he needs procedrue (hold xarelto) S/p lasix 20 mg IV x1 Continue home anti-hypertensives for afterload reduction.  RENAL A:   Hypomagnesmia  P:   Replete mag  GASTROINTESTINAL A:   Possible liver disease (palmar  erythema, elev. Bilirubin / INR) - see [place] report 08/15/2015    P:   Fu as opd needed  HEMATOLOGIC A:   Anticoagulated at home fdor AF P:  Monitor  INFECTIOUS A:   Possible PNA, MRSA  pcr negative P:   Unlikely given normal WBC, lack of fever. Continue zosyn, but plan to stop 8/13  if no s/sx of infection Check PCT DC vanc.  ENDOCRINE A:   Hyponatremia   P:   Likely due to as above  NEUROLOGIC A:   At risk for EtOH w/d P:   If develops Rx [precedex .  FAMILY  - Updates: Daughter updated on admission 08/15/2015. P{atient updated 08/16/2015    - Inter-disciplinary family meet or Palliative Care meeting due by:  8/18      The patient is critically ill with multiple organ systems failure and requires high complexity decision making for assessment and support, frequent evaluation and titration of therapies, application of advanced monitoring technologies and extensive interpretation of multiple databases.   Critical Care  Time devoted to patient care services described in this note is  30  Minutes. This time reflects time of care of this signee Dr Brand Males. This critical care time does not reflect procedure time, or teaching time or supervisory time of PA/NP/Med student/Med Resident etc but could involve care discussion time    Dr. Brand Males, M.D., [facility].C.P Pulmonary and Critical Care Medicine Staff Physician [place] [facility] Pager: [phone], If no answer or between  15:00h - 7:00h: call [phone]  08/16/2015 8:53 AM

## 2015-08-16 NOTE — Progress Notes (Signed)
  Echocardiogram 2D Echocardiogram has been performed.  Gerald Ho 08/16/2015, 5:50 PM

## 2015-08-16 NOTE — Progress Notes (Signed)
[place] Progress Note Patient Name: Gerald Ho DOB: 1950/07/09 MRN: [id number]   Date of Service  08/16/2015  HPI/Events of Note  Patient scheduled for Chest CT Scan with contrast for September as an outpatient. Creatinine = 1.05. Patient requests CT Scan ordered by Dr. Chase Caller be changed to with contrast.   eICU Interventions  Will change Chest CT Scan to with contrast.      Intervention Category Minor Interventions: Routine modifications to care plan (e.g. PRN medications for pain, fever)  Gerald Ho 08/16/2015, 7:22 PM

## 2015-08-16 NOTE — Progress Notes (Signed)
RT note- Attempted to remove Bipap and use nasal canula and Venturi mask. Unable due to low Sp02 80%. Mouth care done at this time.

## 2015-08-16 NOTE — Progress Notes (Signed)
ANTICOAGULATION CONSULT NOTE - Initial Consult  Pharmacy Consult for Enoxaparin  Indication: atrial fibrillation  Allergies  Allergen Reactions  . Budesonide     ?concern for lower extremity edema.    Patient Measurements: Height: 6' (182.9 cm) Weight: 229 lb 11.5 oz (104.2 kg) IBW/kg (Calculated) : 77.6  Vital Signs: Temp: 97.7 F (36.5 C) (08/12 0838) Temp Source: Oral (08/12 0838) BP: 153/87 (08/12 0900) Pulse Rate: 64 (08/12 0900)  Labs:  Recent Labs  08/15/15 1946 08/16/15 0138  HGB 12.8*  --   HCT 38.2*  --   PLT 204  --   LABPROT 21.4*  --   INR 1.83  --   CREATININE 1.05  --   TROPONINI 0.03* 0.04*    Estimated Creatinine Clearance: 88.7 mL/min (by C-G formula based on SCr of 1.05 mg/dL).   Medical History: Past Medical History:  Diagnosis Date  . Abdominal aortic aneurysm (AAA) ([place]) 03/07/2015  . Bipolar disorder ([place])    Takes Lamictal & Seroquel  . Current smoker    2 packs per day  . Dysrhythmia    Atrial Fibrillation  . Emphysema   . GERD (gastroesophageal reflux disease)   . High blood pressure   . Hyperlipemia   . Non-small cell carcinoma of lung, stage 1 ([place]) 02/24/2015  . Pneumonia    hx of  . Shortness of breath dyspnea     Medications:  Prescriptions Prior to Admission  Medication Sig Dispense Refill Last Dose  . albuterol (PROVENTIL HFA;VENTOLIN HFA) 108 (90 BASE) MCG/ACT inhaler Inhale 2 puffs into the lungs every 6 (six) hours as needed for wheezing or shortness of breath. 1 Inhaler 6 Taking  . carvedilol (COREG) 12.5 MG tablet Take 1 tablet (12.5 mg total) by mouth 2 (two) times daily. 62 tablet 11 Taking  . lamoTRIgine (LAMICTAL) 200 MG tablet Take 200 mg by mouth daily at 12 noon.    Taking  . losartan (COZAAR) 50 MG tablet Take 50 mg by mouth daily at 12 noon.    Taking  . methylPREDNISolone (MEDROL DOSEPAK) 4 MG TBPK tablet Use as instructed 21 tablet 0   . pravastatin (PRAVACHOL) 20 MG tablet Take 20 mg by mouth daily at  12 noon.    Taking  . QUEtiapine (SEROQUEL) 100 MG tablet Take 100 mg by mouth at bedtime.    Taking  . RABEprazole (ACIPHEX) 20 MG tablet Take 20 mg by mouth daily at 12 noon.    Taking  . rivaroxaban (XARELTO) 20 MG TABS tablet Take 1 tablet (20 mg total) by mouth daily with supper. 30 tablet 11 Taking  . umeclidinium-vilanterol (ANORO ELLIPTA) 62.5-25 MCG/INH AEPB Inhale 1 puff into the lungs daily. 60 each 5    Scheduled:  . antiseptic oral rinse  7 mL Mouth Rinse q12n4p  . carvedilol  12.5 mg Oral BID  . chlorhexidine  15 mL Mouth Rinse BID  . furosemide  20 mg Intravenous Once  . insulin aspart  0-15 Units Subcutaneous TID WC  . insulin aspart  0-5 Units Subcutaneous QHS  . ipratropium-albuterol  3 mL Nebulization Q4H  . lamoTRIgine  100 mg Oral Q1200  . losartan  50 mg Oral Q1200  . magnesium sulfate 1 - 4 g bolus IVPB  2 g Intravenous Once  . methylPREDNISolone (SOLU-MEDROL) injection  80 mg Intravenous Q8H  . pantoprazole  40 mg Oral Daily  . piperacillin-tazobactam (ZOSYN)  IV  3.375 g Intravenous Q8H  . pravastatin  20 mg  Oral Q1200  . QUEtiapine  50 mg Oral QHS   Infusions:   PRN: sodium chloride  Assessment: Patients is a 65 year old male admitted 08/15/2015 with dyspnea, volume overload, and possible sepsis. Patient is on rivaroxaban prior to admission for a fib, and his last dose was Thursday per nurse.   Patient's hgb 12.8, pltc 204 and creatinine clearance is >30 mL/min. Will start patient on enoxaparin '100mg'$  q12h. No bleeding noted.   Goal of Therapy:  Anticoagulation    Plan:  Enoxaparin '100mg'$  [place] every 12 hours starting this morning Monitor renal function and CBC   Demetrius Charity, PharmD Acute Care Pharmacy Resident  Pager: [phone] 08/16/2015

## 2015-08-17 ENCOUNTER — Inpatient Hospital Stay (HOSPITAL_COMMUNITY): Payer: BLUE CROSS/BLUE SHIELD

## 2015-08-17 DIAGNOSIS — N179 Acute kidney failure, unspecified: Secondary | ICD-10-CM

## 2015-08-17 LAB — BASIC METABOLIC PANEL
ANION GAP: 12 (ref 5–15)
Anion gap: 12 (ref 5–15)
BUN: 15 mg/dL (ref 6–20)
BUN: 14 mg/dL (ref 6–20)
CALCIUM: 8.9 mg/dL (ref 8.9–10.3)
CHLORIDE: 94 mmol/L — AB (ref 101–111)
CHLORIDE: 93 mmol/L — AB (ref 101–111)
CO2: 26 mmol/L (ref 22–32)
CO2: 25 mmol/L (ref 22–32)
CREATININE: 1.51 mg/dL — AB (ref 0.61–1.24)
Calcium: 8.8 mg/dL — ABNORMAL LOW (ref 8.9–10.3)
Creatinine, Ser: 1.4 mg/dL — ABNORMAL HIGH (ref 0.61–1.24)
GFR calc Af Amer: 55 mL/min — ABNORMAL LOW (ref >60)
GFR calc non Af Amer: 47 mL/min — ABNORMAL LOW (ref >60)
GFR calc non Af Amer: 52 mL/min — ABNORMAL LOW (ref >60)
GFR, EST AFRICAN AMERICAN: 60 mL/min — AB (ref >60)
GLUCOSE: 147 mg/dL — AB (ref 65–99)
GLUCOSE: 178 mg/dL — AB (ref 65–99)
POTASSIUM: 4 mmol/L (ref 3.5–5.1)
Potassium: 4 mmol/L (ref 3.5–5.1)
SODIUM: 132 mmol/L — AB (ref 135–145)
Sodium: 130 mmol/L — ABNORMAL LOW (ref 135–145)

## 2015-08-17 LAB — GLUCOSE, CAPILLARY
GLUCOSE-CAPILLARY: 126 mg/dL — AB (ref 65–99)
GLUCOSE-CAPILLARY: 142 mg/dL — AB (ref 65–99)
Glucose-Capillary: 140 mg/dL — ABNORMAL HIGH (ref 65–99)
Glucose-Capillary: 134 mg/dL — ABNORMAL HIGH (ref 65–99)

## 2015-08-17 LAB — CBC WITH DIFFERENTIAL/PLATELET
BASOS ABS: 0 10*3/uL (ref 0.0–0.1)
BASOS PCT: 0 %
Eosinophils Absolute: 0 10*3/uL (ref 0.0–0.7)
Eosinophils Relative: 0 %
HEMATOCRIT: 36.5 % — AB (ref 39.0–52.0)
HEMOGLOBIN: 12.1 g/dL — AB (ref 13.0–17.0)
LYMPHS PCT: 9 %
Lymphs Abs: 0.5 10*3/uL — ABNORMAL LOW (ref 0.7–4.0)
MCH: 33.6 pg (ref 26.0–34.0)
MCHC: 33.2 g/dL (ref 30.0–36.0)
MCV: 101.4 fL — AB (ref 78.0–100.0)
Monocytes Absolute: 0.3 10*3/uL (ref 0.1–1.0)
Monocytes Relative: 6 %
NEUTROS ABS: 4.8 10*3/uL (ref 1.7–7.7)
NEUTROS PCT: 85 %
Platelets: 187 10*3/uL (ref 150–400)
RBC: 3.6 MIL/uL — AB (ref 4.22–5.81)
RDW: 14.3 % (ref 11.5–15.5)
WBC: 5.6 10*3/uL (ref 4.0–10.5)

## 2015-08-17 LAB — PROCALCITONIN: Procalcitonin: <0.1 ng/mL

## 2015-08-17 LAB — URINE CULTURE

## 2015-08-17 LAB — PHOSPHORUS: Phosphorus: 3.8 mg/dL (ref 2.5–4.6)

## 2015-08-17 LAB — MAGNESIUM: MAGNESIUM: 2 mg/dL (ref 1.7–2.4)

## 2015-08-17 MED ORDER — IOPAMIDOL (ISOVUE-300) INJECTION 61%
75.0000 mL | Freq: Once | INTRAVENOUS | Status: AC | PRN
  Administered 2015-08-17: 75 mL via INTRAVENOUS

## 2015-08-17 MED ORDER — METHYLPREDNISOLONE SODIUM SUCC 40 MG IJ SOLR
80.0000 mg | Freq: Three times a day (TID) | INTRAMUSCULAR | Status: DC
  Administered 2015-08-17 – 2015-08-22 (×15): 80 mg via INTRAVENOUS
  Filled 2015-08-17 (×17): qty 2

## 2015-08-17 MED ORDER — SODIUM CHLORIDE 0.9 % IV BOLUS (SEPSIS)
500.0000 mL | Freq: Once | INTRAVENOUS | Status: AC
  Administered 2015-08-17: 500 mL via INTRAVENOUS

## 2015-08-17 NOTE — Progress Notes (Signed)
RT note- fio2 increased due to sp02 79%. Flow increased, patient up in chair with family in room.

## 2015-08-17 NOTE — Progress Notes (Signed)
Pt placed on 8L HFNC and tolerating well at this time.  RT will continue to monitor.

## 2015-08-17 NOTE — Progress Notes (Signed)
PULMONARY / CRITICAL CARE MEDICINE   Name: Gerald Ho MRN: [id number] DOB: [birth year]    ADMISSION DATE:  08/15/2015  CHIEF COMPLAINT:  Dyspnea  BRIEF  Mr. Gerald Ho is an 65 y.o. 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 He also had a nodule in the LUL which was treated concurrently as there was concern for synchronous primary lung CA. He has had some dyspnea since his radiation completed in June, but a CT at that time was not suggestive of radiation-induced lung injury. 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   STUDIES:  CXR: Bibasilar prominent interstitial pattern  CULTURES: Blood 8/11 >> In process Urine 8/11 >> In process Lower Resp 8/11>> In process  ANTIBIOTICS: Vanc 8/11 >>8/12 Zosyn 8/11 >>8/13   LINES/TUBES: PIVs   EVENTS 08/15/2015  -  developed worsening dyspnea and hypoxia over several weeks.BiPAP for dyspnea and hypoxia    SUBJECTIVE/OVERNIGHT/INTERVAL HX 08/17/15: very bipap dependent ; desats easy without it.  No evidence of etoh wd so far. Comes off to eat.   VITAL SIGNS: BP (!) 158/94   Pulse 92   Temp 97.5 F (36.4 C) (Oral)   Resp 20   Ht 6' (1.829 m)   Wt 230 lb 2.6 oz (104.4 kg)   SpO2 97%   BMI 31.22 kg/m   HEMODYNAMICS:    VENTILATOR SETTINGS: FiO2 (%):  [50 %-60 %] 60 %  INTAKE / OUTPUT: I/O last 3 completed shifts: In: 2002.5 [P.O.:1120; I.V.:120; IV Piggyback:762.5] Out: 7025 [Urine:7025]  PHYSICAL EXAMINATION: General:  Mild respiratory distress, on NIMVS, cannot come more than a few minutes without desat. Neuro:  Awake, alert, fully oriented. HEENT:  MMM Cardiovascular: + JVP, tachycardic Lungs: bibasilar crackles Abdomen:  Obese abdomen Musculoskeletal:  Moderate clubbing noted Skin:  Palmar erythema  LABS:  PULMONARY  Recent  Labs Lab 08/15/15 2021  PHART 7.352  PCO2ART 35.2  PO2ART 79.0*  HCO3 19.6*  TCO2 21  O2SAT 95.0    CBC  Recent Labs Lab 08/15/15 1946 08/17/15 0348  HGB 12.8* 12.1*  HCT 38.2* 36.5*  WBC 3.1* 5.6  PLT 204 187    COAGULATION  Recent Labs Lab 08/15/15 1946  INR 1.83    CARDIAC    Recent Labs Lab 08/15/15 1946 08/16/15 0138 08/16/15 0859  TROPONINI 0.03* 0.04* 0.03*   No results for input(s): PROBNP in the last 168 hours.   CHEMISTRY  Recent Labs Lab 08/15/15 1946 08/17/15 0348  NA 129* 132*  K 4.4 4.0  CL 99* 94*  CO2 20* 26  GLUCOSE 151* 147*  BUN 5* 15  CREATININE 1.05 1.51*  CALCIUM 8.8* 8.8*  MG 1.4* 2.0  PHOS 3.4 3.8   Estimated Creatinine Clearance: 61.7 mL/min (by C-G formula based on SCr of 1.51 mg/dL).   LIVER  Recent Labs Lab 08/15/15 1946  AST 16  ALT 14*  ALKPHOS 80  BILITOT 1.3*  PROT 6.7  ALBUMIN 3.7  INR 1.83     INFECTIOUS  Recent Labs Lab 08/15/15 1946 08/15/15 2304  LATICACIDVEN 1.5 1.9     ENDOCRINE CBG (last 3)   Recent Labs  08/16/15 1648 08/16/15 2346 08/17/15 0834  GLUCAP 161* 200* 140*         IMAGING x48h  - image(s) personally visualized  -   highlighted in bold Dg Chest [facility]  1 View  Result Date: 08/15/2015 CLINICAL DATA:  65 year old male with cough. Lung cancer. Abnormal right lung and hilum since chest CT 06/05/2015. EXAM: PORTABLE CHEST 1 VIEW COMPARISON:  Radiographs 08/15/2015 and earlier. FINDINGS: Portable AP semi upright view at 2017 hours. Stable lung volumes. Stable cardiomegaly and mediastinal contours. Continued asymmetric increased opacity at the right hilum. Bilateral basilar predominant increased interstitial markings are stable. No pneumothorax or pleural effusion. No new pulmonary opacity. IMPRESSION: No new cardiopulmonary abnormality. Stable abnormal right lung and hilar findings as described yesterday and by CT on 06/05/2015. Electronically Signed   By: Genevie Ann  M.D.   On: 08/15/2015 20:33   US Abdomen Limited Ruq  Result Date: 08/15/2015 CLINICAL DATA:  Initial evaluation for palmar erythema, concern for cirrhosis. EXAM: US ABDOMEN LIMITED - RIGHT UPPER QUADRANT COMPARISON:  None. FINDINGS: Gallbladder: Several echogenic stones present within the gallbladder lumen, largest wedged measured approximately 2 mm. Gallbladder sludge present. Gallbladder wall measured at the upper limits of normal at 3 mm. No free pericholecystic fluid. No sonographic Murphy's sign was not exam, although the patient was medicated. Common bile duct: Diameter: 4.2 mm. Liver: No focal lesion identified. Diffusely increased echogenicity, most commonly associated with steatosis. No obvious changes related to cirrhosis identified. IMPRESSION: 1. Cholelithiasis with gallbladder sludge without sonographic evidence for acute cholecystitis. No biliary dilatation. 2. Increased and somewhat coarse echogenicity within the hepatic parenchyma. While this finding is often related to hepatic steatosis, possible early cirrhotic changes could also have this appearance. Correlation with the laboratory values recommended. Electronically Signed   By: Jeannine Boga M.D.   On: 08/15/2015 22:40       DISCUSSION: 65 y/o M w/ hx of Tobacco and EtOH use, lung CA s/p stereotactic rads. P/w dyspnea and hypoxia and volume overload.  ASSESSMENT / PLAN:  PULMONARY A: Hypoxic Respiratory failure Severe COPD Lung CA, s/p curative rads    - 08/17/2015 - mild improvement only ; bipap depdendennt but no wheze. S/p lasix x 1. Doubt PE - he was on xarelto. Repeat ct pending P:   - Repeat CT of chest  - rule out XRT pneumonitis - start steroids or radiation pneumonitis (steroids) v aECOPD. - Ipratroprium / albuterol nebs q4.  CARDIOVASCULAR A:  Hx of AF -compliant per daughter with xarelto CHF - chronic systolic ef 85% in 0277 AAA Hypertension  - nil acute  P:  Start LMWH in case he needs  procedrue (hold xarelto) S/p lasix 20 mg IV x1 Continue home anti-hypertensives for afterload reduction.  RENAL A:   Hypomagnesmia  P:   Repleted mag  GASTROINTESTINAL A:   Possible liver disease (palmar erythema, elev. Bilirubin / INR) - see [place] report 08/15/2015    P:   Fu as opd needed  HEMATOLOGIC A:   Anticoagulated at home fdor AF P:  Monitor  INFECTIOUS A:   Possible PNA, MRSA  pcr negative P:   Unlikely given normal WBC, lack of fever.  zosyn,  stop 8/13  if no s/sx of infection  PCT 0.22 DC vanc./ zoysn Check CT chest  ENDOCRINE  Recent Labs Lab 08/15/15 1946 08/17/15 0348  NA 129* 132*    A:   Hyponatremia  Correcting P:   Likely due to as above  NEUROLOGIC A:   At risk for EtOH w/d P:   If develops Rx [precedex .  FAMILY  - Updates: Daughter updated on admission 08/15/2015. Patient updated 08/17/2015    - Inter-disciplinary family meet or  Palliative Care meeting due by:  8/18  Global: remains on NIMVS day 3. For CT chest 8/14. May need intubation   [facility] Minor ACNP Maryanna Shape PCCM Pager [phone] till 3 pm If no answer page [phone] 08/17/2015, 11:28 AM

## 2015-08-17 NOTE — Progress Notes (Signed)
Pt transported to and from CT on BIPAP and tolerated well.  RT will continue to monitor.

## 2015-08-18 ENCOUNTER — Inpatient Hospital Stay (HOSPITAL_COMMUNITY): Payer: BLUE CROSS/BLUE SHIELD

## 2015-08-18 DIAGNOSIS — I272 Pulmonary hypertension, unspecified: Secondary | ICD-10-CM | POA: Diagnosis present

## 2015-08-18 DIAGNOSIS — Z7901 Long term (current) use of anticoagulants: Secondary | ICD-10-CM

## 2015-08-18 DIAGNOSIS — Z515 Encounter for palliative care: Secondary | ICD-10-CM

## 2015-08-18 DIAGNOSIS — J189 Pneumonia, unspecified organism: Secondary | ICD-10-CM

## 2015-08-18 LAB — CBC WITH DIFFERENTIAL/PLATELET
BASOS ABS: 0 10*3/uL (ref 0.0–0.1)
Basophils Relative: 0 %
EOS PCT: 0 %
Eosinophils Absolute: 0 10*3/uL (ref 0.0–0.7)
HEMATOCRIT: 36.7 % — AB (ref 39.0–52.0)
Hemoglobin: 12 g/dL — ABNORMAL LOW (ref 13.0–17.0)
LYMPHS PCT: 9 %
Lymphs Abs: 0.5 10*3/uL — ABNORMAL LOW (ref 0.7–4.0)
MCH: 33.3 pg (ref 26.0–34.0)
MCHC: 32.7 g/dL (ref 30.0–36.0)
MCV: 101.9 fL — AB (ref 78.0–100.0)
MONO ABS: 0.2 10*3/uL (ref 0.1–1.0)
MONOS PCT: 3 %
NEUTROS ABS: 4.6 10*3/uL (ref 1.7–7.7)
Neutrophils Relative %: 88 %
PLATELETS: 167 10*3/uL (ref 150–400)
RBC: 3.6 MIL/uL — ABNORMAL LOW (ref 4.22–5.81)
RDW: 14.3 % (ref 11.5–15.5)
WBC: 5.2 10*3/uL (ref 4.0–10.5)

## 2015-08-18 LAB — MAGNESIUM: MAGNESIUM: 2 mg/dL (ref 1.7–2.4)

## 2015-08-18 LAB — BASIC METABOLIC PANEL
ANION GAP: 8 (ref 5–15)
ANION GAP: 12 (ref 5–15)
BUN: 14 mg/dL (ref 6–20)
BUN: 15 mg/dL (ref 6–20)
CALCIUM: 9.2 mg/dL (ref 8.9–10.3)
CHLORIDE: 97 mmol/L — AB (ref 101–111)
CO2: 26 mmol/L (ref 22–32)
CO2: 26 mmol/L (ref 22–32)
Calcium: 8.8 mg/dL — ABNORMAL LOW (ref 8.9–10.3)
Chloride: 96 mmol/L — ABNORMAL LOW (ref 101–111)
Creatinine, Ser: 1.28 mg/dL — ABNORMAL HIGH (ref 0.61–1.24)
Creatinine, Ser: 1.36 mg/dL — ABNORMAL HIGH (ref 0.61–1.24)
GFR calc non Af Amer: 58 mL/min — ABNORMAL LOW (ref >60)
GFR, EST NON AFRICAN AMERICAN: 53 mL/min — AB (ref >60)
Glucose, Bld: 143 mg/dL — ABNORMAL HIGH (ref 65–99)
Glucose, Bld: 161 mg/dL — ABNORMAL HIGH (ref 65–99)
POTASSIUM: 4.1 mmol/L (ref 3.5–5.1)
POTASSIUM: 4.1 mmol/L (ref 3.5–5.1)
SODIUM: 131 mmol/L — AB (ref 135–145)
Sodium: 134 mmol/L — ABNORMAL LOW (ref 135–145)

## 2015-08-18 LAB — GLUCOSE, CAPILLARY
GLUCOSE-CAPILLARY: 135 mg/dL — AB (ref 65–99)
GLUCOSE-CAPILLARY: 149 mg/dL — AB (ref 65–99)
GLUCOSE-CAPILLARY: 168 mg/dL — AB (ref 65–99)

## 2015-08-18 LAB — LEGIONELLA PNEUMOPHILA SEROGP 1 UR AG: L. pneumophila Serogp 1 Ur Ag: NEGATIVE

## 2015-08-18 LAB — PROCALCITONIN

## 2015-08-18 LAB — PHOSPHORUS: PHOSPHORUS: 3.8 mg/dL (ref 2.5–4.6)

## 2015-08-18 MED ORDER — FUROSEMIDE 10 MG/ML IJ SOLN
40.0000 mg | Freq: Three times a day (TID) | INTRAMUSCULAR | Status: AC
  Administered 2015-08-18 (×2): 40 mg via INTRAVENOUS
  Filled 2015-08-18 (×2): qty 4

## 2015-08-18 MED ORDER — METOPROLOL TARTRATE 25 MG PO TABS
25.0000 mg | ORAL_TABLET | Freq: Two times a day (BID) | ORAL | Status: DC
  Administered 2015-08-18 – 2015-08-22 (×8): 25 mg via ORAL
  Filled 2015-08-18 (×9): qty 1

## 2015-08-18 MED ORDER — QUETIAPINE FUMARATE 100 MG PO TABS
100.0000 mg | ORAL_TABLET | Freq: Every day | ORAL | Status: DC
  Administered 2015-08-18 – 2015-08-21 (×4): 100 mg via ORAL
  Filled 2015-08-18 (×6): qty 1

## 2015-08-18 MED ORDER — GUAIFENESIN ER 600 MG PO TB12
600.0000 mg | ORAL_TABLET | Freq: Two times a day (BID) | ORAL | Status: DC
  Administered 2015-08-18 – 2015-08-22 (×9): 600 mg via ORAL
  Filled 2015-08-18 (×10): qty 1

## 2015-08-18 MED ORDER — IPRATROPIUM-ALBUTEROL 0.5-2.5 (3) MG/3ML IN SOLN
3.0000 mL | Freq: Four times a day (QID) | RESPIRATORY_TRACT | Status: DC
  Administered 2015-08-18 – 2015-08-22 (×16): 3 mL via RESPIRATORY_TRACT
  Filled 2015-08-18 (×16): qty 3

## 2015-08-18 NOTE — Progress Notes (Signed)
PULMONARY / CRITICAL CARE MEDICINE   Name: Gerald Ho MRN: [id number] DOB: 11/13/1950    ADMISSION DATE:  08/15/2015  CHIEF COMPLAINT:  Dyspnea  BRIEF  [name] is an 65 y.o. 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 He also had a nodule in the LUL which was treated concurrently as there was concern for synchronous primary lung CA. He is on 5+ L of O2 at home due to COPD/emphysemia.  He has had some dyspnea since his radiation completed in June, but a CT at that time was not suggestive of radiation-induced lung injury. 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   STUDIES:  CXR: Bibasilar prominent interstitial pattern CT chest 8/14 > Increased interstitial type opacities intervening airspace opacity in the superior segment of the right lower lobe with associated volume loss. This may reflect radiation induced fibrosis progressing since the prior study. Pneumonia is possible. 8/11 RUL [place] > cholelithiasis and no evidence for cholecystitis. Possible early cirrhosis.   CULTURES: Blood 8/11 >> In process Urine 8/11 >> In process Lower Resp 8/11>> In process  ANTIBIOTICS: Vanc 8/11 >>8/12 Zosyn 8/11 >>8/13   LINES/TUBES: PIVs   EVENTS   SUBJECTIVE/OVERNIGHT/INTERVAL HX 8/14> not feeling much better. Wants to get up more but desats into 80s n 15L HFNC.    VITAL SIGNS: BP (!) 165/110   Pulse 75   Temp 98 F (36.7 C) (Oral)   Resp 14   Ht 6' (1.829 m)   Wt 104.1 kg (229 lb 8 oz)   SpO2 90%   BMI 31.13 kg/m   HEMODYNAMICS:    VENTILATOR SETTINGS: FiO2 (%):  [50 %-60 %] 60 %  INTAKE / OUTPUT: I/O last 3 completed shifts: In: 2370 [P.O.:1220; I.V.:100; IV Piggyback:1050] Out: 3000 [Urine:3000]  PHYSICAL EXAMINATION: General:  Just ambulated to chair from bathroom. O2 sats 82% on  15L HFNC Neuro:  Awake, alert, fully oriented. HEENT:  MMM Cardiovascular: + JVP, tachycardic Lungs: dense rales throughout Abdomen:  Obese abdomen Musculoskeletal:  Moderate clubbing noted Skin:  Palmar erythema  LABS:  PULMONARY  Recent Labs Lab 08/15/15 2021  PHART 7.352  PCO2ART 35.2  PO2ART 79.0*  HCO3 19.6*  TCO2 21  O2SAT 95.0    CBC  Recent Labs Lab 08/15/15 1946 08/17/15 0348 08/18/15 0228  HGB 12.8* 12.1* 12.0*  HCT 38.2* 36.5* 36.7*  WBC 3.1* 5.6 5.2  PLT 204 187 167    COAGULATION  Recent Labs Lab 08/15/15 1946  INR 1.83    CARDIAC    Recent Labs Lab 08/15/15 1946 08/16/15 0138 08/16/15 0859  TROPONINI 0.03* 0.04* 0.03*   No results for input(s): PROBNP in the last 168 hours.   CHEMISTRY  Recent Labs Lab 08/15/15 1946 08/17/15 0348 08/17/15 1940 08/18/15 0228  NA 129* 132* 130* 131*  K 4.4 4.0 4.0 4.1  CL 99* 94* 93* 97*  CO2 20* 26 25 26   GLUCOSE 151* 147* 178* 143*  BUN 5* 15 14 14   CREATININE 1.05 1.51* 1.40* 1.28*  CALCIUM 8.8* 8.8* 8.9 8.8*  MG 1.4* 2.0  --  2.0  PHOS 3.4 3.8  --  3.8   Estimated Creatinine Clearance: 72.7 mL/min (by C-G formula based on SCr of 1.28 mg/dL).   LIVER  Recent Labs Lab 08/15/15 1946  AST 16  ALT 14*  ALKPHOS 80  BILITOT 1.3*  PROT 6.7  ALBUMIN 3.7  INR 1.83     INFECTIOUS  Recent Labs Lab 08/15/15 1946 08/15/15 2304 08/17/15 1605 08/18/15 0228  LATICACIDVEN 1.5 1.9  --   --   PROCALCITON  --   --  <0.10 <0.10     ENDOCRINE CBG (last 3)   Recent Labs  08/17/15 1721 08/17/15 2210 08/18/15 0818  GLUCAP 134* 142* 135*         IMAGING x48h  - image(s) personally visualized  -   highlighted in bold Ct Chest W Contrast  Result Date: 08/17/2015 CLINICAL DATA:  Shortness of breath.  History of lung carcinoma. EXAM: CT CHEST WITH CONTRAST TECHNIQUE: Multidetector CT imaging of the chest was performed during intravenous contrast administration. CONTRAST:   95m ISOVUE-300 IOPAMIDOL (ISOVUE-300) INJECTION 61% COMPARISON:  Chest radiograph, 08/15/2015.  Chest CT, 06/05/2015. FINDINGS: Neck base and axilla:  No mass or adenopathy. Cardiovascular: Heart is mildly enlarged. There are dense coronary artery calcifications. Calcified atherosclerotic plaque noted along the thoracic aorta and at the origin of the aortic arch branch vessels. No significant stenosis. Mediastinum/Nodes: Prominent to mildly enlarged mediastinal lymph nodes. There is a 12 mm short axis right peritracheal, azygos level node. Allowing for measurement technique differences, this is without change from the prior CT. No mediastinal or hilar masses. Lungs/Pleura: Hazy airspace opacity and irregular interstitial opacities are noted in the superior segment of the right upper lobe with associated volume loss. There is additional posterior right lower lobe opacity adjacent to a small right effusion. There is interstitial thickening in the posterior lateral aspect of the right upper lobe. Coarse reticular opacities and mild bronchiectasis is noted in the posterior medial left upper lobe. When compared the prior exam, the opacity in the superior segment of the right lower lobe has increased in the dependent atelectasis has developed. A small right effusion has increased in size. There are underlying changes of advanced emphysema. Upper Abdomen: Small gallstones. No liver mass on the included field of view. No adrenal masses. No acute findings. Aortic atherosclerosis. Musculoskeletal: Mild wedge-shaped compression fracture of T12, chronic. Mild compression fracture of T5 also chronic. No osteoblastic or osteolytic lesions. IMPRESSION: 1. Increased interstitial type opacities intervening airspace opacity in the superior segment of the right lower lobe with associated volume loss. This may reflect radiation induced fibrosis progressing since the prior study. Pneumonia is possible. Small right pleural effusion is  increased from the prior study as has dependent right lower lobe atelectasis. 2. No other significant change from prior exam. There stable areas of lung scarring and stable changes of advanced emphysema. Mild mediastinal adenopathy is stable. Electronically Signed   By: DLajean ManesM.D.   On: 08/17/2015 12:02   Dg Chest Port 1 View  Result Date: 08/18/2015 CLINICAL DATA:  Shortness of breath.  History of lung cancer. EXAM: PORTABLE CHEST 1 VIEW COMPARISON:  CT 08/17/2015.  Chest x-ray 11/2015 common 04/04/2015. FINDINGS: Cardiomegaly with slight pulmonary vascular prominence and bilateral progressive interstitial prominence suggesting congestive heart failure. Underlying chronic interstitial lung disease is present. Small right pleural effusion. No pneumothorax . IMPRESSION: 1. Cardiomegaly with mild pulmonary vascular prominence and increased interstitial prominence. Small right pleural effusion. Findings consistent with congestive heart failure. 2. Underlying chronic interstitial lung disease. Electronically Signed   By: TMarcello Moores Register   On: 08/18/2015 06:48       DISCUSSION: 65y/o M w/ hx of Tobacco and EtOH use, lung CA s/p stereotactic rads. P/w dyspnea  and hypoxia and volume overload.  ASSESSMENT / PLAN:  PULMONARY A: Acute on chronic Hypoxic Respiratory failure - etiology unclear. 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  Severe COPD without exacerbation (on home O2) Lung CA, s/p curative rads (61month out)  P:   - Repeat CT of chest  - rule out XRT pneumonitis - QHS BiPAP - Continue steroids 80q8 - Ipratroprium / albuterol nebs q4. - Add mucinex at pt request  CARDIOVASCULAR A:  Hx of AF -compliant per daughter with xarelto CHF - chronic systolic ef 341%in 25830AAA Hypertension  P:  Telemtery Start LMWH in case he needs procedrue (hold xarelto) Continue home anti-hypertensives for afterload reduction. Will attempt to diurese today Lasix 423mx 2 doses  RENAL A:    Hypomagnesmia  P:   Repleted mag  GASTROINTESTINAL A:   Possible liver disease (palmar erythema, elev. Bilirubin / INR) - possible cirrhosis on [place]/11 P:   Fu as opd needed  HEMATOLOGIC A:   Anticoagulated at home for AF  P:  Monitor  INFECTIOUS A:   Possible PNA, MRSA  pcr negative  P:   Unlikely given normal WBC, lack of fever.  zosyn,  stop 8/13  if no s/sx of infection  PCT 0.22 DC vanc./ zoysn Check CT chest  ENDOCRINE A:   No acute issues  P:   Follow glucose on chemistries  NEUROLOGIC A:   At risk for EtOH w/d > no s/s as of 8/14  P:   Monitor  FAMILY  - Updates: Patient updated 8/14 > remains full code, informed he would likely never liberate from ventilator.    - Inter-disciplinary family meet or Palliative Care meeting due by:  8/18   PaGeorgann HousekeeperAGACNP-BC [facility] Pager [phone] ([phone]/14/2017 10:44 AM  Attending Note:  [age]ear old male with severe O2 dependent COPD who presents with lung cancer and was not operable so radiation was started.  Patient now presents with hypoxemic respiratory failure.  On exam, decreased BS diffusely.  I reviewed chest CT myself, infiltrate on the RLL noted next to cancer.  I am concerned that the patient's O2 demand continues to increase.  We attempted to multiple occasions to discuss code status.  He is made aware that if intubated he is very much unlikely to be extubated.  He is leaning more towards home with hospice but O2 demand is too high.  He is waiting for brother and daughter to arrive to discuss code status.   The patient is critically ill with multiple organ systems failure and requires high complexity decision making for assessment and support, frequent evaluation and titration of therapies, application of advanced monitoring technologies and extensive interpretation of multiple databases.   Critical Care Time devoted to patient care services described in  this note is  35  Minutes. This time reflects time of care of this signee Dr WeJennet MaduroThis critical care time does not reflect procedure time, or teaching time or supervisory time of PA/NP/Med student/Med Resident etc but could involve care discussion time.  WeRush FarmerM.D. [facility] Medicine. Pager: [phone] hours pager: [phone]

## 2015-08-18 NOTE — Consult Note (Signed)
Reason for Consult:   AF  Requesting Physician: CCM Primary Cardiologist Dr Acie Fredrickson  HPI:   65 y/o male with a history of COPD, smoking, and PAF. He had a DCCV in Feb 2016 and held NSR till at least Jan 2017 but he was back in AF by exam when Dr Acie Fredrickson saw him in the office in April 2017. He has also been diagnosed with non small cell lung cancer and has had radiation Rx. He is admitted now with respiratory failure felt to be primarily secondary to lung disease, possible radiation fibrosis vs pneumonitis. The pt asked if cardioverting him to NSR would help as apparently his outlook from a pulmonary standpoint is not good. He has been anticoagulated prior to admission with Xarelto and is now on full dose Lovenox.   PMHx:  Past Medical History:  Diagnosis Date  . Abdominal aortic aneurysm (AAA) ([place]) 03/07/2015  . Bipolar disorder ([place])    Takes Lamictal & Seroquel  . Current smoker    2 packs per day  . Dysrhythmia    Atrial Fibrillation  . Emphysema   . GERD (gastroesophageal reflux disease)   . High blood pressure   . Hyperlipemia   . Non-small cell carcinoma of lung, stage 1 ([place]) 02/24/2015  . Pneumonia    hx of  . Shortness of breath dyspnea     Past Surgical History:  Procedure Laterality Date  . CARDIOVERSION N/A 02/26/2014   Procedure: CARDIOVERSION;  Surgeon: Thayer Headings, MD;  Location: [facility] ENDOSCOPY;  Service: Cardiovascular;  Laterality: N/A;  . COLONOSCOPY W/ POLYPECTOMY    . LUMBAR DISC SURGERY     X 2  . THUMB FUSION Right   . UPPER GI ENDOSCOPY    . VIDEO BRONCHOSCOPY WITH ENDOBRONCHIAL NAVIGATION N/A 01/29/2015   Procedure: VIDEO BRONCHOSCOPY WITH ENDOBRONCHIAL NAVIGATION;  Surgeon: Collene Gobble, MD;  Location: [place];  Service: Thoracic;  Laterality: N/A;  . VIDEO BRONCHOSCOPY WITH ENDOBRONCHIAL ULTRASOUND N/A 01/29/2015   Procedure: VIDEO BRONCHOSCOPY WITH ENDOBRONCHIAL ULTRASOUND;  Surgeon: Collene Gobble, MD;  Location: [place];  Service:  Thoracic;  Laterality: N/A;    SOCHx:  reports that he has been smoking Cigarettes.  He has a 10.00 pack-year smoking history. His smokeless tobacco use includes Chew. He reports that he drinks about 2.4 oz of alcohol per week . He reports that he uses drugs.  FAMHx: Family History  Problem Relation Age of Onset  . Emphysema Father   . Heart attack Father   . AAA (abdominal aortic aneurysm) Mother   . Cancer Maternal Aunt     pancreatic    ALLERGIES: Allergies  Allergen Reactions  . Budesonide Swelling    Possibly caused feet swelling    ROS: Review of Systems: General: negative for chills, fever, night sweats or weight changes.  Cardiovascular: negative for chest pain, orthopnea, palpitations HEENT: negative for any visual disturbances, blindness, glaucoma Dermatological: negative for rash Respiratory: negative for cough, hemoptysis, or wheezing Urologic: negative for hematuria or dysuria Abdominal: negative for nausea, vomiting, diarrhea, bright red blood per rectum, melena, or hematemesis Neurologic: negative for visual changes, syncope, or dizziness Musculoskeletal: negative for back pain, joint pain, or swelling Psych: cooperative and appropriate All other systems reviewed and are otherwise negative except as noted above.   HOME MEDICATIONS: Prior to Admission medications   Medication Sig Start Date End Date Taking? Authorizing Provider  albuterol (PROVENTIL HFA;VENTOLIN HFA) 108 (90 BASE) MCG/ACT  inhaler Inhale 2 puffs into the lungs every 6 (six) hours as needed for wheezing or shortness of breath. 12/05/13  Yes Collene Gobble, MD  budesonide (PULMICORT) 0.5 MG/2ML nebulizer solution Take 0.5 mg by nebulization 2 (two) times daily. 07/24/15  Yes Historical Provider, MD  carvedilol (COREG) 12.5 MG tablet Take 1 tablet (12.5 mg total) by mouth 2 (two) times daily. 01/28/15  Yes Thayer Headings, MD  ipratropium-albuterol (DUONEB) 0.5-2.5 (3) MG/3ML SOLN Take by  nebulization 2 (two) times daily. 07/24/15  Yes Historical Provider, MD  lamoTRIgine (LAMICTAL) 200 MG tablet Take 200 mg by mouth every evening. 7 pm   Yes Historical Provider, MD  losartan (COZAAR) 50 MG tablet Take 50 mg by mouth every evening. 7pm   Yes Historical Provider, MD  OXYGEN Inhale 5 L into the lungs continuous.   Yes Historical Provider, MD  pravastatin (PRAVACHOL) 20 MG tablet Take 20 mg by mouth at bedtime.    Yes Historical Provider, MD  QUEtiapine (SEROQUEL) 100 MG tablet Take 50-100 mg by mouth at bedtime.    Yes Historical Provider, MD  RABEprazole (ACIPHEX) 20 MG tablet Take 20 mg by mouth every evening. 7pm   Yes Historical Provider, MD  rivaroxaban (XARELTO) 20 MG TABS tablet Take 1 tablet (20 mg total) by mouth daily with supper. Patient taking differently: Take 20 mg by mouth at bedtime. 11 pm 03/25/15  Yes Will Meredith Leeds, MD  umeclidinium-vilanterol [facility] ELLIPTA) 62.5-25 MCG/INH AEPB Inhale 1 puff into the lungs daily. Patient not taking: Reported on 08/16/2015 06/26/15   Collene Gobble, MD    HOSPITAL MEDICATIONS: I have reviewed the patient's current medications.  VITALS: Blood pressure (!) 142/84, pulse (!) 103, temperature 97.6 F (36.4 C), temperature source Oral, resp. rate 20, height 6' (1.829 m), weight 229 lb 8 oz (104.1 kg), SpO2 92 %.  PHYSICAL EXAM: General appearance: alert, cooperative, no distress and on O2 Neck: no carotid bruit and no JVD Lungs: fairly clear, no wheezing Heart: irregularly irregular rhythm Abdomen: soft, non-tender; bowel sounds normal; no masses,  no organomegaly Extremities: trace edema bilateral LE Pulses: 2+ and symmetric Skin: pale, cool, dry Neurologic: Grossly normal  LABS: Results for orders placed or performed during the hospital encounter of 08/15/15 (from the past 24 hour(s))  Basic metabolic panel     Status: Abnormal   Collection Time: 08/17/15  7:40 PM  Result Value Ref Range   Sodium 130 (L) 135 - 145  mmol/L   Potassium 4.0 3.5 - 5.1 mmol/L   Chloride 93 (L) 101 - 111 mmol/L   CO2 25 22 - 32 mmol/L   Glucose, Bld 178 (H) 65 - 99 mg/dL   BUN 14 6 - 20 mg/dL   Creatinine, Ser 1.40 (H) 0.61 - 1.24 mg/dL   Calcium 8.9 8.9 - 10.3 mg/dL   GFR calc non Af Amer 52 (L) >60 mL/min   GFR calc Af Amer 60 (L) >60 mL/min   Anion gap 12 5 - 15  Glucose, capillary     Status: Abnormal   Collection Time: 08/17/15 10:10 PM  Result Value Ref Range   Glucose-Capillary 142 (H) 65 - 99 mg/dL  Magnesium     Status: None   Collection Time: 08/18/15  2:28 AM  Result Value Ref Range   Magnesium 2.0 1.7 - 2.4 mg/dL  Phosphorus     Status: None   Collection Time: 08/18/15  2:28 AM  Result Value Ref Range   Phosphorus  3.8 2.5 - 4.6 mg/dL  CBC with Differential/Platelet     Status: Abnormal   Collection Time: 08/18/15  2:28 AM  Result Value Ref Range   WBC 5.2 4.0 - 10.5 K/uL   RBC 3.60 (L) 4.22 - 5.81 MIL/uL   Hemoglobin 12.0 (L) 13.0 - 17.0 g/dL   HCT 36.7 (L) 39.0 - 52.0 %   MCV 101.9 (H) 78.0 - 100.0 fL   MCH 33.3 26.0 - 34.0 pg   MCHC 32.7 30.0 - 36.0 g/dL   RDW 14.3 11.5 - 15.5 %   Platelets 167 150 - 400 K/uL   Neutrophils Relative % 88 %   Neutro Abs 4.6 1.7 - 7.7 K/uL   Lymphocytes Relative 9 %   Lymphs Abs 0.5 (L) 0.7 - 4.0 K/uL   Monocytes Relative 3 %   Monocytes Absolute 0.2 0.1 - 1.0 K/uL   Eosinophils Relative 0 %   Eosinophils Absolute 0.0 0.0 - 0.7 K/uL   Basophils Relative 0 %   Basophils Absolute 0.0 0.0 - 0.1 K/uL  Basic metabolic panel     Status: Abnormal   Collection Time: 08/18/15  2:28 AM  Result Value Ref Range   Sodium 131 (L) 135 - 145 mmol/L   Potassium 4.1 3.5 - 5.1 mmol/L   Chloride 97 (L) 101 - 111 mmol/L   CO2 26 22 - 32 mmol/L   Glucose, Bld 143 (H) 65 - 99 mg/dL   BUN 14 6 - 20 mg/dL   Creatinine, Ser 1.28 (H) 0.61 - 1.24 mg/dL   Calcium 8.8 (L) 8.9 - 10.3 mg/dL   GFR calc non Af Amer 58 (L) >60 mL/min   GFR calc Af Amer >60 >60 mL/min   Anion gap 8 5  - 15  Procalcitonin     Status: None   Collection Time: 08/18/15  2:28 AM  Result Value Ref Range   Procalcitonin <0.10 ng/mL  Glucose, capillary     Status: Abnormal   Collection Time: 08/18/15  8:18 AM  Result Value Ref Range   Glucose-Capillary 135 (H) 65 - 99 mg/dL   Comment 1 Notify RN    Comment 2 Document in Chart   Basic metabolic panel     Status: Abnormal   Collection Time: 08/18/15 11:31 AM  Result Value Ref Range   Sodium 134 (L) 135 - 145 mmol/L   Potassium 4.1 3.5 - 5.1 mmol/L   Chloride 96 (L) 101 - 111 mmol/L   CO2 26 22 - 32 mmol/L   Glucose, Bld 161 (H) 65 - 99 mg/dL   BUN 15 6 - 20 mg/dL   Creatinine, Ser 1.36 (H) 0.61 - 1.24 mg/dL   Calcium 9.2 8.9 - 10.3 mg/dL   GFR calc non Af Amer 53 (L) >60 mL/min   GFR calc Af Amer >60 >60 mL/min   Anion gap 12 5 - 15  Glucose, capillary     Status: Abnormal   Collection Time: 08/18/15 11:52 AM  Result Value Ref Range   Glucose-Capillary 149 (H) 65 - 99 mg/dL   Comment 1 Notify RN    Comment 2 Document in Chart     EKG: pending  Telem: AF with VR 90s  Echo 08/16/15 - Left ventricle: The cavity size was normal. Wall thickness was   normal. Systolic function was normal. The estimated ejection   fraction was in the range of 50% to 55%. - Aortic valve: Valve area (VTI): 2.31 cm^2. Valve area (Vmax): 2.4  cm^2. - Right ventricle: The cavity size was moderately dilated. Systolic   function was mildly reduced. - Right atrium: The atrium was mildly to moderately dilated. - Atrial septum: No defect or patent foramen ovale was identified. - Pulmonary arteries: Systolic pressure was moderately increased.   PA peak pressure: 64 mm Hg (S). - Technically difficult study.    IMAGING: Ct Chest W Contrast  Result Date: 08/17/2015 CLINICAL DATA:  Shortness of breath.  History of lung carcinoma. EXAM: CT CHEST WITH CONTRAST TECHNIQUE: Multidetector CT imaging of the chest was performed during intravenous contrast  administration. CONTRAST:  77m ISOVUE-300 IOPAMIDOL (ISOVUE-300) INJECTION 61% COMPARISON:  Chest radiograph, 08/15/2015.  Chest CT, 06/05/2015. FINDINGS: Neck base and axilla:  No mass or adenopathy. Cardiovascular: Heart is mildly enlarged. There are dense coronary artery calcifications. Calcified atherosclerotic plaque noted along the thoracic aorta and at the origin of the aortic arch branch vessels. No significant stenosis. Mediastinum/Nodes: Prominent to mildly enlarged mediastinal lymph nodes. There is a 12 mm short axis right peritracheal, azygos level node. Allowing for measurement technique differences, this is without change from the prior CT. No mediastinal or hilar masses. Lungs/Pleura: Hazy airspace opacity and irregular interstitial opacities are noted in the superior segment of the right upper lobe with associated volume loss. There is additional posterior right lower lobe opacity adjacent to a small right effusion. There is interstitial thickening in the posterior lateral aspect of the right upper lobe. Coarse reticular opacities and mild bronchiectasis is noted in the posterior medial left upper lobe. When compared the prior exam, the opacity in the superior segment of the right lower lobe has increased in the dependent atelectasis has developed. A small right effusion has increased in size. There are underlying changes of advanced emphysema. Upper Abdomen: Small gallstones. No liver mass on the included field of view. No adrenal masses. No acute findings. Aortic atherosclerosis. Musculoskeletal: Mild wedge-shaped compression fracture of T12, chronic. Mild compression fracture of T5 also chronic. No osteoblastic or osteolytic lesions. IMPRESSION: 1. Increased interstitial type opacities intervening airspace opacity in the superior segment of the right lower lobe with associated volume loss. This may reflect radiation induced fibrosis progressing since the prior study. Pneumonia is possible. Small  right pleural effusion is increased from the prior study as has dependent right lower lobe atelectasis. 2. No other significant change from prior exam. There stable areas of lung scarring and stable changes of advanced emphysema. Mild mediastinal adenopathy is stable. Electronically Signed   By: DLajean ManesM.D.   On: 08/17/2015 12:02   Dg Chest Port 1 View  Result Date: 08/18/2015 CLINICAL DATA:  Shortness of breath.  History of lung cancer. EXAM: PORTABLE CHEST 1 VIEW COMPARISON:  CT 08/17/2015.  Chest x-ray 11/2015 common 04/04/2015. FINDINGS: Cardiomegaly with slight pulmonary vascular prominence and bilateral progressive interstitial prominence suggesting congestive heart failure. Underlying chronic interstitial lung disease is present. Small right pleural effusion. No pneumothorax . IMPRESSION: 1. Cardiomegaly with mild pulmonary vascular prominence and increased interstitial prominence. Small right pleural effusion. Findings consistent with congestive heart failure. 2. Underlying chronic interstitial lung disease. Electronically Signed   By: TNilwood  On: 08/18/2015 06:48    IMPRESSION: Principal Problem:   Acute respiratory failure with hypoxia (HCC) Active Problems:   COPD (chronic obstructive pulmonary disease) (HCC)   Atrial fibrillation with RVR (HCC)   Non-small cell carcinoma of lung, stage 1 (HCC)   Pneumonia   Chronic anticoagulation   Pulmonary hypertension ([place]  Tobacco abuse disorder   Acute kidney injury (nontraumatic) (HCC)   RECOMMENDATION: Change Coreg to Lopressor. Will review cardioversion with MD, it seems he would need an antiarrhythmic for this to be effective. Amiodarone not an option with lung disease, can't use Flecainide as no prior coronary assessment.   Time Spent Directly with Patient: [address] minutes  Kerin Ransom, Virgie beeper 08/18/2015, 6:24 PM    Patient seen and examined. Agree with assessment and plan.  Mr. Rc Amison is a  65 year old Caucasian male who has a previous significant tobacco history with resultant COPD/emphysema.  He has been diagnosed with non-small cell lung cancer and is status post radiation treatment.  He has been admitted with respiratory failure, felt primarily due to his lung disease with radiation pneumonitis.  The patient is followed by Dr. Mertie Moores for cardiology care.  He has a history of paroxysmal atrial fibrillation and underwent initial cardioversion in 2016.  An echo study showed reduction of LV function to 35% with mild LVH and diffuse hypokinesis.  His left atrium was moderate to severely dilated in his right atrium was moderately dilated.  His RV was dilated and he had moderate RV dysfunction.  PA pressure at that time was 52 mm.  The patient states that he believes he maintained sinus rhythm for at least a year, but earlier this year was found to be back in atrial fibrillation of questionable duration.  He last saw Dr. Acie Fredrickson in April 2017 and was in atrial fibrillation at that time.  Rate control and anticoagulation is recommended and the patient was maintained on Xarelto.  Recently, he has been on carvedilol 12.5 mg twice a day.  He has had exacerbation of pulmonary status during his current admission, but previously denied recent wheezing.  He requested cardiology consultation today to see if carvedilol was the best drug for him and potentially if he would be a candidate for recurrent cardioversion.  He denies any chest pain.  Does not appear that he ever had an ischemic evaluation.  He would not be a candidate for antiarrhythmic therapy with amiodarone due to his significant lung disease.  The echo Doppler study done this weekend showed an ejection fraction at 50-55% and although not dictated in the report by atrial dimensions.  His LA was most likely mild at least mild to moderately dilated as was his RA.  He continue to have mild RV dysfunction.  PA pressure had now increased to 64 mm.   With his COPD/emphysema and with improvement of LV function, I will change his beta blocker therapy to metoprolol for more cardioselectivity particularly with recent wheezing exacerbation.  Although he would have a high likelihood for recurrent AF after another cardioversion attempt I will discuss with Dr. Acie Fredrickson for potential initiation of antiarrhythmic therapy prior to potential cardioversion versus to just continue with rate control and long-term anticoagulation.   Troy Sine, MD, [facility] 08/18/2015 6:59 PM

## 2015-08-18 NOTE — Progress Notes (Signed)
[place] Progress Note Patient Name: Gerald Ho DOB: 07-10-1950 MRN: [id number]   Date of Service  08/18/2015  HPI/Events of Note  Contacted by bedside nurse regarding patient's ongoing hypoxic respiratory failure with underlying atrial fibrillation. Patient also reports that his home Seroquel dose is 100 mg daily at bedtime. Currently ordered Seroquel 50 mg by mouth daily at bedtime. Follows with cardiology outpatient and patient requesting evaluation by his cardiologist to determine if there is any further treatments that may be able to improve his cardiac function and thereby his oxygenation.   eICU Interventions  I personally spoke with Dr. Radford Pax requesting a consultation to address his echocardiogram and underlying arrhythmia.      Intervention Category Intermediate Interventions: Arrhythmia - evaluation and management  Tera Partridge 08/18/2015, 5:26 PM

## 2015-08-19 ENCOUNTER — Inpatient Hospital Stay (HOSPITAL_COMMUNITY): Payer: BLUE CROSS/BLUE SHIELD

## 2015-08-19 DIAGNOSIS — I4891 Unspecified atrial fibrillation: Secondary | ICD-10-CM

## 2015-08-19 DIAGNOSIS — J439 Emphysema, unspecified: Secondary | ICD-10-CM

## 2015-08-19 DIAGNOSIS — Z7189 Other specified counseling: Secondary | ICD-10-CM

## 2015-08-19 DIAGNOSIS — R0600 Dyspnea, unspecified: Secondary | ICD-10-CM

## 2015-08-19 LAB — GLUCOSE, CAPILLARY
GLUCOSE-CAPILLARY: 172 mg/dL — AB (ref 65–99)
GLUCOSE-CAPILLARY: 155 mg/dL — AB (ref 65–99)
Glucose-Capillary: 166 mg/dL — ABNORMAL HIGH (ref 65–99)
Glucose-Capillary: 202 mg/dL — ABNORMAL HIGH (ref 65–99)

## 2015-08-19 LAB — CBC
HEMATOCRIT: 39.1 % (ref 39.0–52.0)
HEMOGLOBIN: 12.9 g/dL — AB (ref 13.0–17.0)
MCH: 33.9 pg (ref 26.0–34.0)
MCHC: 33 g/dL (ref 30.0–36.0)
MCV: 102.6 fL — ABNORMAL HIGH (ref 78.0–100.0)
Platelets: 188 10*3/uL (ref 150–400)
RBC: 3.81 MIL/uL — AB (ref 4.22–5.81)
RDW: 14.4 % (ref 11.5–15.5)
WBC: 5.6 10*3/uL (ref 4.0–10.5)

## 2015-08-19 LAB — PHOSPHORUS: Phosphorus: 3.8 mg/dL (ref 2.5–4.6)

## 2015-08-19 LAB — MAGNESIUM: Magnesium: 2 mg/dL (ref 1.7–2.4)

## 2015-08-19 LAB — PROCALCITONIN

## 2015-08-19 MED ORDER — FUROSEMIDE 10 MG/ML IJ SOLN
40.0000 mg | Freq: Three times a day (TID) | INTRAMUSCULAR | Status: AC
  Administered 2015-08-19: 40 mg via INTRAVENOUS
  Filled 2015-08-19: qty 4

## 2015-08-19 MED ORDER — FUROSEMIDE 10 MG/ML IJ SOLN
40.0000 mg | Freq: Once | INTRAMUSCULAR | Status: AC
  Administered 2015-08-19: 40 mg via INTRAVENOUS
  Filled 2015-08-19: qty 4

## 2015-08-19 MED ORDER — MORPHINE SULFATE 10 MG/5ML PO SOLN: 2.5000 mg | ORAL | Status: DC | PRN

## 2015-08-19 NOTE — Consult Note (Signed)
Consultation Note Date: 08/19/2015   Patient Name: Gerald Ho  DOB: 12/11/1950  MRN: [id number]  Age / Sex: 65 y.o., male  PCP: Maryella Shivers, MD Referring Physician: Raylene Miyamoto, MD  Reason for Consultation: Establishing goals of care  HPI/Patient Profile: 65 y.o. 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 Bipolar, smoker, GERD, Atrial fib on Xarelto, HTN HLD admitted on 08/15/2015 with worsening dyspnea since June when completed radiation so concern for COPD exacerbation with radiation induced pneumonitis. Has had increased oxygen requirements since admission.   Clinical Assessment and Goals of Care: I met today with Mr. Corbit after reviewing chart and discussing with Dr. Nelda Marseille. Mr. Stavros is very pleasant and sitting up in chair. He seems very practical in his assessment of his declining health. He understands his severe respiratory decline and admits difficulty in talking with his daughter about his "end of days." His main concern is being able to return home to his [place] and he states that he is open to having the help of hospice. He also states that he cannot clean or fix his food. He is interested in all the assistance that he is able to get at home. Also using BiPAP qhs.   We did discuss code status and he is inclined towards DNR but he would like to talk to his daughter more before making this official. He also needs to discuss hospice with her. He says that she lives in [place] and will be leaving tomorrow and then he plans to have his brother help discuss these things with her. He would also like to think about completing a Living Will but does not want to have this in his room until after his daughter leaves. He very much is wanting to protect her. They  have already had discussion today but I will encourage further discussion.   We also discussed low dose morphine trial for dyspnea as I feel this will be necessary for him at home. Will start prn while hospitalized and will continue conversations as well as continue to titrate oxygen down.   HCPOA will need to be decided. Legally his daughter but he does mention his brother who is an Forensic psychologist.     Code Status/Advance Care Planning:  Limited code considering full DNR   Symptom Management:   Dyspnea: Morphine 2.5 mg every 4 hours prn.   Palliative Prophylaxis:   Aspiration and Delirium Protocol  Additional Recommendations (Limitations, Scope, Preferences):  Avoid Hospitalization  Psycho-social/Spiritual:   Desire for further Chaplaincy support:no  Additional Recommendations: Caregiving  Support/Resources and Education on Hospice  Prognosis:   < 6 months  Discharge Planning: Home with Hospice      Primary Diagnoses: Present on Admission: . Pneumonia . Acute respiratory failure with hypoxia ([place]) . Acute kidney injury (nontraumatic) ([place]) . Atrial fibrillation with RVR ([place]) . Non-small cell carcinoma of lung, stage  1 ([place]) . Tobacco abuse disorder . COPD (chronic obstructive pulmonary disease) ([place]) . Pulmonary hypertension ([place])   I have reviewed the medical record, interviewed the patient and family, and examined the patient. The following aspects are pertinent.  Past Medical History:  Diagnosis Date  . Abdominal aortic aneurysm (AAA) ([place]) 03/07/2015  . Bipolar disorder ([place])    Takes Lamictal & Seroquel  . Current smoker    2 packs per day  . Dysrhythmia    Atrial Fibrillation  . Emphysema   . GERD (gastroesophageal reflux disease)   . High blood pressure   . Hyperlipemia   . Non-small cell carcinoma of lung, stage 1 ([place]) 02/24/2015  . Pneumonia    hx of  . Shortness of breath dyspnea    Social History   Social History  . Marital status:  Divorced    Spouse name: N/A  . Number of children: N/A  . Years of education: N/A   Social History Main Topics  . Smoking status: Current Every Day Smoker    Packs/day: 0.25    Years: 40.00    Types: Cigarettes  . Smokeless tobacco: Current User    Types: Chew     Comment: 4 Cigarettes per day/ counseled to quit.  . Alcohol use 2.4 oz/week    4 Cans of beer per week     Comment: 4 cans of beer/ day  . Drug use:      Comment: past use but, none recently  . Sexual activity: No   Other Topics Concern  . Not on file   Social History Narrative  . No narrative on file   Family History  Problem Relation Age of Onset  . Emphysema Father   . Heart attack Father   . AAA (abdominal aortic aneurysm) Mother   . Cancer Maternal Aunt     pancreatic   Scheduled Meds: . antiseptic oral rinse  7 mL Mouth Rinse q12n4p  . chlorhexidine  15 mL Mouth Rinse BID  . enoxaparin (LOVENOX) injection  100 mg Subcutaneous Q12H  . furosemide  40 mg Intravenous Q8H  . guaiFENesin  600 mg Oral BID  . insulin aspart  0-15 Units Subcutaneous TID WC  . insulin aspart  0-5 Units Subcutaneous QHS  . ipratropium-albuterol  3 mL Nebulization Q6H  . lamoTRIgine  100 mg Oral Q1200  . losartan  50 mg Oral Q1200  . methylPREDNISolone sodium succinate  80 mg Intravenous Q8H  . metoprolol tartrate  25 mg Oral BID  . pantoprazole  40 mg Oral Daily  . pravastatin  20 mg Oral Q1200  . QUEtiapine  100 mg Oral QHS   Continuous Infusions:  PRN Meds:.sodium chloride Medications Prior to Admission:  Prior to Admission medications   Medication Sig Start Date End Date Taking? Authorizing Provider  albuterol (PROVENTIL HFA;VENTOLIN HFA) 108 (90 BASE) MCG/ACT inhaler Inhale 2 puffs into the lungs every 6 (six) hours as needed for wheezing or shortness of breath. 12/05/13  Yes Collene Gobble, MD  budesonide (PULMICORT) 0.5 MG/2ML nebulizer solution Take 0.5 mg by nebulization 2 (two) times daily. 07/24/15  Yes  Historical Provider, MD  carvedilol (COREG) 12.5 MG tablet Take 1 tablet (12.5 mg total) by mouth 2 (two) times daily. 01/28/15  Yes Thayer Headings, MD  ipratropium-albuterol (DUONEB) 0.5-2.5 (3) MG/3ML SOLN Take by nebulization 2 (two) times daily. 07/24/15  Yes Historical Provider, MD  lamoTRIgine (LAMICTAL) 200 MG tablet Take 200 mg by mouth every evening.  7 pm   Yes Historical Provider, MD  losartan (COZAAR) 50 MG tablet Take 50 mg by mouth every evening. 7pm   Yes Historical Provider, MD  OXYGEN Inhale 5 L into the lungs continuous.   Yes Historical Provider, MD  pravastatin (PRAVACHOL) 20 MG tablet Take 20 mg by mouth at bedtime.    Yes Historical Provider, MD  QUEtiapine (SEROQUEL) 100 MG tablet Take 50-100 mg by mouth at bedtime.    Yes Historical Provider, MD  RABEprazole (ACIPHEX) 20 MG tablet Take 20 mg by mouth every evening. 7pm   Yes Historical Provider, MD  rivaroxaban (XARELTO) 20 MG TABS tablet Take 1 tablet (20 mg total) by mouth daily with supper. Patient taking differently: Take 20 mg by mouth at bedtime. 11 pm 03/25/15  Yes Will Meredith Leeds, MD  umeclidinium-vilanterol [facility] ELLIPTA) 62.5-25 MCG/INH AEPB Inhale 1 puff into the lungs daily. Patient not taking: Reported on 08/16/2015 06/26/15   Collene Gobble, MD   Allergies  Allergen Reactions  . Budesonide Swelling    Possibly caused feet swelling   Review of Systems  Constitutional: Positive for activity change and fatigue.  Respiratory: Positive for shortness of breath.     Physical Exam  Constitutional: He is oriented to person, place, and time. He appears well-developed and well-nourished.  HENT:  Head: Normocephalic and atraumatic.  Cardiovascular: Normal rate.  An irregularly irregular rhythm present.  Pulmonary/Chest: No accessory muscle usage. Tachypnea noted. No respiratory distress. He has decreased breath sounds.  SOB when talking  Abdominal: Normal appearance.  Neurological: He is alert and oriented to  person, place, and time.    Vital Signs: BP 139/90   Pulse 96   Temp 97.5 F (36.4 C) (Oral)   Resp 20   Ht 6' (1.829 m)   Wt 102.2 kg (225 lb 5 oz)   SpO2 91%   BMI 30.56 kg/m  Pain Assessment: No/denies pain POSS *See Group Information*: 1-Acceptable,Awake and alert     SpO2: SpO2: 91 % O2 Device:SpO2: 91 % O2 Flow Rate: .O2 Flow Rate (L/min): 12 L/min  IO: Intake/output summary:  Intake/Output Summary (Last 24 hours) at 08/19/15 1515 Last data filed at 08/19/15 0600  Gross per 24 hour  Intake              240 ml  Output             2375 ml  Net            -2135 ml    LBM: Last BM Date: 08/19/15 Baseline Weight: Weight: 105.5 kg (232 lb 9.4 oz) Most recent weight: Weight: 102.2 kg (225 lb 5 oz)     Palliative Assessment/Data: PPS: 30%     Time In: 1500 Time Out: 1600 Time Total: 35mn Greater than 50%  of this time was spent counseling and coordinating care related to the above assessment and plan.  Signed by: PPershing Proud NP   Please contact Palliative Medicine Team phone at [phone] questions and concerns.  For individual provider: See AShea Evans

## 2015-08-19 NOTE — Care Management Note (Addendum)
Case Management Note  Patient Details  Name: JULE SCHLABACH MRN: [id number] Date of Birth: 07-30-1950  Subjective/Objective:        Pt admitted in resp distress - s/p Lung CA radiation            Action/Plan:  PTA from home alone - per pt he was able to do all ADLs.  On home O2 before current event 2 -5 liters (latter for exertion) .  Prior to this admit pt was using two separate O2 tanks at 5 liters each to attempt to achieve 10 liters total continuous.  Pt receives oxygen from [facility] - attending made aware of home O2 need at home.  Currently pt is flucuating between BIPAP and HF [place] 12-15 liters.  CM will continue to follow for discharge needs      Expected Discharge Date:                  Expected Discharge Plan:     In-House Referral:     Discharge planning Services  CM Consult  Post Acute Care Choice:    Choice offered to:     DME Arranged:    DME Agency:     HH Arranged:    HH Agency:     Status of Service:  In process, will continue to follow  If discussed at Long Length of Stay Meetings, dates discussed:    Additional Comments:  [name], RN 08/19/2015, 12:28 PM

## 2015-08-19 NOTE — Progress Notes (Addendum)
Subjective:  No chest pain, but with cough   Objective:   Vital Signs : Vitals:   08/19/15 1514 08/19/15 1515 08/19/15 1600 08/19/15 1700  BP: 139/90  (!) 147/90 (!) 163/121  Pulse: 96  (!) 109 99  Resp: 20   (!) 25  Temp:    97.5 F (36.4 C)  TempSrc:    Oral  SpO2: 91% (!) 89% (!) 88% (!) 86%  Weight:      Height:        Intake/Output from previous day:  Intake/Output Summary (Last 24 hours) at 08/19/15 1756 Last data filed at 08/19/15 1700  Gross per 24 hour  Intake              240 ml  Output             4025 ml  Net            -3785 ml    I/O since admission: -9309  Wt Readings from Last 3 Encounters:  08/19/15 225 lb 5 oz (102.2 kg)  06/12/15 221 lb 14.4 oz (100.7 kg)  05/06/15 220 lb 11.2 oz (100.1 kg)    Medications: . antiseptic oral rinse  7 mL Mouth Rinse q12n4p  . chlorhexidine  15 mL Mouth Rinse BID  . enoxaparin (LOVENOX) injection  100 mg Subcutaneous Q12H  . furosemide  40 mg Intravenous Q8H  . guaiFENesin  600 mg Oral BID  . insulin aspart  0-15 Units Subcutaneous TID WC  . insulin aspart  0-5 Units Subcutaneous QHS  . ipratropium-albuterol  3 mL Nebulization Q6H  . lamoTRIgine  100 mg Oral Q1200  . losartan  50 mg Oral Q1200  . methylPREDNISolone sodium succinate  80 mg Intravenous Q8H  . metoprolol tartrate  25 mg Oral BID  . pantoprazole  40 mg Oral Daily  . pravastatin  20 mg Oral Q1200  . QUEtiapine  100 mg Oral QHS       Physical Exam:   General appearance: alert, appears older than stated age and no distress Neck: no adenopathy, supple, symmetrical, trachea midline and thyroid not enlarged, symmetric, no tenderness/mass/nodules Lungs: decreased BS with rhonchi Heart: irregularly irregular rhythm Abdomen: soft, non-tender; bowel sounds normal; no masses,  no organomegaly Extremities: palmar erythema Neurologic: Grossly normal   Rate: 80-90  Rhythm: atrial fibrillation    Lab Results:   Recent Labs  08/17/15 0348  08/17/15 1940 08/18/15 0228 08/18/15 1131 08/19/15 0228  NA 132* 130* 131* 134*  --   K 4.0 4.0 4.1 4.1  --   CL 94* 93* 97* 96*  --   CO2 26 25 26 26   --   GLUCOSE 147* 178* 143* 161*  --   BUN 15 14 14 15   --   CREATININE 1.51* 1.40* 1.28* 1.36*  --   CALCIUM 8.8* 8.9 8.8* 9.2  --   MG 2.0  --  2.0  --  2.0  PHOS 3.8  --  3.8  --  3.8    Hepatic Function Latest Ref Rng & Units 08/15/2015 06/05/2015 02/24/2015  Total Protein 6.5 - 8.1 g/dL 6.7 7.4 7.6  Albumin 3.5 - 5.0 g/dL 3.7 3.8 4.0  AST 15 - 41 U/L 16 11 16   ALT 17 - 63 U/L 14(L) <9 10  Alk Phosphatase 38 - 126 U/L 80 88 89  Total Bilirubin 0.3 - 1.2 mg/dL 1.3(H) 1.60(H) 1.05  Bilirubin, Direct <=0.2 mg/dL - - -  Recent Labs  08/17/15 0348 08/18/15 0228 08/19/15 0228  WBC 5.6 5.2 5.6  NEUTROABS 4.8 4.6  --   HGB 12.1* 12.0* 12.9*  HCT 36.5* 36.7* 39.1  MCV 101.4* 101.9* 102.6*  PLT 187 167 188    No results for input(s): TROPONINI in the last 72 hours.  Invalid input(s): CK, MB  Lab Results  Component Value Date   TSH 1.394 02/22/2011   No results for input(s): HGBA1C in the last 72 hours.  No results for input(s): PROT, ALBUMIN, AST, ALT, ALKPHOS, BILITOT, BILIDIR, IBILI in the last 72 hours. No results for input(s): INR in the last 72 hours. BNP (last 3 results)  Recent Labs  08/15/15 1955  BNP 1,419.2*    ProBNP (last 3 results) No results for input(s): PROBNP in the last 8760 hours.   Lipid Panel     Component Value Date/Time   CHOL 132 10/29/2014 0942   TRIG 90 10/29/2014 0942   HDL 32 (L) 10/29/2014 0942   CHOLHDL 4.1 10/29/2014 0942   VLDL 18 10/29/2014 0942   LDLCALC 82 10/29/2014 0942      Imaging:  Dg Chest Port 1 View  Result Date: 08/19/2015 CLINICAL DATA:  Respiratory failure. EXAM: PORTABLE CHEST 1 VIEW COMPARISON:  08/18/2015.  01/31/2015. FINDINGS: Cardiomegaly with pulmonary venous congestion and bilateral pulmonary infiltrates again noted without interim change.  Findings consistent with bilateral pulmonary edema. Small bilateral pleural effusions. Costophrenic angles not completely imaged. No pneumothorax. Underlying chronic interstitial lung disease. IMPRESSION: 1. Persistent changes of congestive heart failure with bilateral pulmonary edema and small pleural effusions. No interim change from prior exam. 2.  Underlying chronic interstitial lung disease. Electronically Signed   By: Marcello Moores  Register   On: 08/19/2015 07:03   Dg Chest Port 1 View  Result Date: 08/18/2015 CLINICAL DATA:  Shortness of breath.  History of lung cancer. EXAM: PORTABLE CHEST 1 VIEW COMPARISON:  CT 08/17/2015.  Chest x-ray 11/2015 common 04/04/2015. FINDINGS: Cardiomegaly with slight pulmonary vascular prominence and bilateral progressive interstitial prominence suggesting congestive heart failure. Underlying chronic interstitial lung disease is present. Small right pleural effusion. No pneumothorax . IMPRESSION: 1. Cardiomegaly with mild pulmonary vascular prominence and increased interstitial prominence. Small right pleural effusion. Findings consistent with congestive heart failure. 2. Underlying chronic interstitial lung disease. Electronically Signed   By: Marcello Moores  Register   On: 08/18/2015 06:48      Assessment/Plan:   Principal Problem:   Acute respiratory failure with hypoxia ([place]) Active Problems:   COPD (chronic obstructive pulmonary disease) ([facility])   Atrial fibrillation with RVR ([facility])   Tobacco abuse disorder   Non-small cell carcinoma of lung, stage 1 ([facility])   Pneumonia   Acute kidney injury (nontraumatic) ([facility])   Chronic anticoagulation   Pulmonary hypertension ([place])   HCAP (healthcare-associated pneumonia)   When the patient was seen yesterday due to his severe lung disease and rate, wheezing, I discontinued carvedilol which is a nonselective beta blocker and changed this to metoprolol.  I have discussed his case with his primary cardiologist, Dr. Acie Fredrickson.  With his  significant lung disease, he is not a candidate for amiodarone.  He is not a good candidate for additional antiarrhythmic therapy including Tikosyn or flecanide.  He has had reduced LV function in the past with an EF of 35%, which has improved.  With his significant pulmonary hypertension, and significantly dilated atrium, it is our feeling that repeat cardioversion would only be short lived, if successful, particularly since he is  unable to be on concomitant antiarrhythmic therapy to reduce potential AF recurrent rate.  As result, we do not recommend repeat cardioversion and recommend continued rate control with metoprolol and continuation of anticoagulation therapy with Xarelto.    Troy Sine, MD, [facility] 08/19/2015, 5:56 PM

## 2015-08-19 NOTE — Progress Notes (Signed)
PULMONARY / CRITICAL CARE MEDICINE   Name: Gerald Ho MRN: [id number] DOB: 02-May-1950    ADMISSION DATE:  08/15/2015  CHIEF COMPLAINT:  Dyspnea  BRIEF  Gerald Ho is an 65 y.o. 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 He also had a nodule in the LUL which was treated concurrently as there was concern for synchronous primary lung CA. He is on 5+ L of O2 at home due to COPD/emphysemia.  He has had some dyspnea since his radiation completed in June, but a CT at that time was not suggestive of radiation-induced lung injury. 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   STUDIES:  CXR: Bibasilar prominent interstitial pattern CT chest 8/14 > Increased interstitial type opacities intervening airspace opacity in the superior segment of the right lower lobe with associated volume loss. This may reflect radiation induced fibrosis progressing since the prior study. Pneumonia is possible. 8/11 RUL [place] > cholelithiasis and no evidence for cholecystitis. Possible early cirrhosis.   CULTURES: Blood 8/11 >> In process Urine 8/11 >> In process Lower Resp 8/11>> In process  ANTIBIOTICS: Vanc 8/11 >>8/12 Zosyn 8/11 >>8/13  LINES/TUBES: PIVs  EVENTS   SUBJECTIVE/OVERNIGHT/INTERVAL HX No events overnight, continues to desaturate with activity.  VITAL SIGNS: BP 140/79   Pulse 97   Temp 97.5 F (36.4 C) (Oral)   Resp 15   Ht 6' (1.829 m)   Wt 225 lb 5 oz (102.2 kg)   SpO2 91%   BMI 30.56 kg/m   HEMODYNAMICS:    VENTILATOR SETTINGS: FiO2 (%):  [60 %-70 %] 70 %  INTAKE / OUTPUT: I/O last 3 completed shifts: In: 1840 [P.O.:1840] Out: 7829 [Urine:4575; Stool:2]  PHYSICAL EXAMINATION: General:  Sitting in chair looks better, off bipap Neuro:  Awake, alert, fully oriented. HEENT:   MMM Cardiovascular: decreased jvd, SR Lungs: dense rales throughout Abdomen:  Obese abdomen Musculoskeletal:  Moderate clubbing noted Skin:  Palmar erythema  LABS:  PULMONARY  Recent Labs Lab 08/15/15 2021  PHART 7.352  PCO2ART 35.2  PO2ART 79.0*  HCO3 19.6*  TCO2 21  O2SAT 95.0    CBC  Recent Labs Lab 08/17/15 0348 08/18/15 0228 08/19/15 0228  HGB 12.1* 12.0* 12.9*  HCT 36.5* 36.7* 39.1  WBC 5.6 5.2 5.6  PLT 187 167 188    COAGULATION  Recent Labs Lab 08/15/15 1946  INR 1.83    CARDIAC    Recent Labs Lab 08/15/15 1946 08/16/15 0138 08/16/15 0859  TROPONINI 0.03* 0.04* 0.03*   No results for input(s): PROBNP in the last 168 hours.   CHEMISTRY  Recent Labs Lab 08/15/15 1946 08/17/15 0348 08/17/15 1940 08/18/15 0228 08/18/15 1131 08/19/15 0228  NA 129* 132* 130* 131* 134*  --   K 4.4 4.0 4.0 4.1 4.1  --   CL 99* 94* 93* 97* 96*  --   CO2 20* 26 25 26 26   --   GLUCOSE 151* 147* 178* 143* 161*  --   BUN 5* 15 14 14 15   --   CREATININE 1.05 1.51* 1.40* 1.28* 1.36*  --   CALCIUM 8.8* 8.8* 8.9 8.8* 9.2  --   MG 1.4* 2.0  --  2.0  --  2.0  PHOS 3.4 3.8  --  3.8  --  3.8   Estimated Creatinine Clearance: 67.8 mL/min (by C-G formula based  on SCr of 1.36 mg/dL).   LIVER  Recent Labs Lab 08/15/15 1946  AST 16  ALT 14*  ALKPHOS 80  BILITOT 1.3*  PROT 6.7  ALBUMIN 3.7  INR 1.83     INFECTIOUS  Recent Labs Lab 08/15/15 1946 08/15/15 2304 08/17/15 1605 08/18/15 0228 08/19/15 0228  LATICACIDVEN 1.5 1.9  --   --   --   PROCALCITON  --   --  <0.10 <0.10 <0.10     ENDOCRINE CBG (last 3)   Recent Labs  08/18/15 2212 08/19/15 0914 08/19/15 1111  GLUCAP 168* 166* 172*   IMAGING x48h  - image(s) personally visualized  -   highlighted in bold Ct Chest W Contrast  Result Date: 08/17/2015 CLINICAL DATA:  Shortness of breath.  History of lung carcinoma. EXAM: CT CHEST WITH CONTRAST TECHNIQUE: Multidetector CT imaging of the  chest was performed during intravenous contrast administration. CONTRAST:  85m ISOVUE-300 IOPAMIDOL (ISOVUE-300) INJECTION 61% COMPARISON:  Chest radiograph, 08/15/2015.  Chest CT, 06/05/2015. FINDINGS: Neck base and axilla:  No mass or adenopathy. Cardiovascular: Heart is mildly enlarged. There are dense coronary artery calcifications. Calcified atherosclerotic plaque noted along the thoracic aorta and at the origin of the aortic arch branch vessels. No significant stenosis. Mediastinum/Nodes: Prominent to mildly enlarged mediastinal lymph nodes. There is a 12 mm short axis right peritracheal, azygos level node. Allowing for measurement technique differences, this is without change from the prior CT. No mediastinal or hilar masses. Lungs/Pleura: Hazy airspace opacity and irregular interstitial opacities are noted in the superior segment of the right upper lobe with associated volume loss. There is additional posterior right lower lobe opacity adjacent to a small right effusion. There is interstitial thickening in the posterior lateral aspect of the right upper lobe. Coarse reticular opacities and mild bronchiectasis is noted in the posterior medial left upper lobe. When compared the prior exam, the opacity in the superior segment of the right lower lobe has increased in the dependent atelectasis has developed. A small right effusion has increased in size. There are underlying changes of advanced emphysema. Upper Abdomen: Small gallstones. No liver mass on the included field of view. No adrenal masses. No acute findings. Aortic atherosclerosis. Musculoskeletal: Mild wedge-shaped compression fracture of T12, chronic. Mild compression fracture of T5 also chronic. No osteoblastic or osteolytic lesions. IMPRESSION: 1. Increased interstitial type opacities intervening airspace opacity in the superior segment of the right lower lobe with associated volume loss. This may reflect radiation induced fibrosis progressing  since the prior study. Pneumonia is possible. Small right pleural effusion is increased from the prior study as has dependent right lower lobe atelectasis. 2. No other significant change from prior exam. There stable areas of lung scarring and stable changes of advanced emphysema. Mild mediastinal adenopathy is stable. Electronically Signed   By: DLajean ManesM.D.   On: 08/17/2015 12:02   Dg Chest Port 1 View  Result Date: 08/19/2015 CLINICAL DATA:  Respiratory failure. EXAM: PORTABLE CHEST 1 VIEW COMPARISON:  08/18/2015.  01/31/2015. FINDINGS: Cardiomegaly with pulmonary venous congestion and bilateral pulmonary infiltrates again noted without interim change. Findings consistent with bilateral pulmonary edema. Small bilateral pleural effusions. Costophrenic angles not completely imaged. No pneumothorax. Underlying chronic interstitial lung disease. IMPRESSION: 1. Persistent changes of congestive heart failure with bilateral pulmonary edema and small pleural effusions. No interim change from prior exam. 2.  Underlying chronic interstitial lung disease. Electronically Signed   By: TMarcello Moores Register   On: 08/19/2015 07:03   Dg  Chest Port 1 View  Result Date: 08/18/2015 CLINICAL DATA:  Shortness of breath.  History of lung cancer. EXAM: PORTABLE CHEST 1 VIEW COMPARISON:  CT 08/17/2015.  Chest x-ray 11/2015 common 04/04/2015. FINDINGS: Cardiomegaly with slight pulmonary vascular prominence and bilateral progressive interstitial prominence suggesting congestive heart failure. Underlying chronic interstitial lung disease is present. Small right pleural effusion. No pneumothorax . IMPRESSION: 1. Cardiomegaly with mild pulmonary vascular prominence and increased interstitial prominence. Small right pleural effusion. Findings consistent with congestive heart failure. 2. Underlying chronic interstitial lung disease. Electronically Signed   By: Marcello Moores  Register   On: 08/18/2015 06:48     Intake/Output Summary (Last  24 hours) at 08/19/15 1134 Last data filed at 08/19/15 0600  Gross per 24 hour  Intake              880 ml  Output             3977 ml  Net            -3097 ml     DISCUSSION: 65 y/o M w/ hx of Tobacco and EtOH use, lung CA s/p stereotactic rads. P/w dyspnea and hypoxia and volume overload.  ASSESSMENT / PLAN:  PULMONARY A: Acute on chronic Hypoxic Respiratory failure - etiology unclear. 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  Severe COPD without exacerbation (on home O2) Lung CA, s/p curative rads (19month out)  P:   - Repeat CT of chest  , NSC - QHS BiPAP - Continue steroids 80q8 - Ipratroprium / albuterol nebs q4. - Add mucinex at pt request  CARDIOVASCULAR A:  Hx of AF -compliant per daughter with xarelto CHF - chronic systolic ef 327%in 20623AAA Hypertension  P:  Telemtery Start LMWH in case he needs procedrue (hold xarelto) Continue home anti-hypertensives for afterload reduction. 8/14 lasix x 2 with neg 3 l on 8/15 8/15 repeat lasix  RENAL  Recent Labs Lab 08/17/15 1940 08/18/15 0228 08/18/15 1131  K 4.0 4.1 4.1   A:   Hypomagnesmia  P:   Repleted mag  GASTROINTESTINAL A:   Possible liver disease (palmar erythema, elev. Bilirubin / INR) - possible cirrhosis on [place]11 P:   Fu as opd needed  HEMATOLOGIC A:   Anticoagulated at home for AF  P:  Monitor  INFECTIOUS A:   Possible PNA, MRSA  pcr negative  P:   Unlikely given normal WBC, lack of fever.  zosyn,  stop 8/13  if no s/sx of infection  PCT 0.22 DC'd vanc./ zoysn   ENDOCRINE A:   No acute issues  P:   Follow glucose on chemistries  NEUROLOGIC A:   At risk for EtOH w/d > no s/s as of 8/15  P:   Monitor  FAMILY  - Updates: Patient updated 8/15 > remains full code, informed he would likely never liberate from ventilator.    - Inter-disciplinary family meet or Palliative Care meeting due by:  8/18   SRichardson LandryMinor ACNP LMaryanna ShapePCCM Pager [phone] 3 pm If no answer  page [phone] 97928/15/2017, 11:31 AM  Attending Note:  65year old male with severe COPD and pulmonary hypertension.  Diagnosed with lung cancer stage one but was not a surgical candidate due to emphysema. Patient is in progressive hypoxic respiratory failure that is multi-factorial.  On exam, BS are distant.  I met with daughter and patient.  Had an extensive discussion.  After discussion, patient is a LCB with no intubation.  BiPAP ok and short  term support only.  Patient is aware that prognosis is poor.  His only wish is to go home and die comfortably.  Will change code status.  Will call palliative care.  No need for daily blood draws.  Continue current treatment and diureses.  The patient is critically ill with multiple organ systems failure and requires high complexity decision making for assessment and support, frequent evaluation and titration of therapies, application of advanced monitoring technologies and extensive interpretation of multiple databases.   Critical Care Time devoted to patient care services described in this note is  35  Minutes. This time reflects time of care of this signee Dr Jennet Maduro. This critical care time does not reflect procedure time, or teaching time or supervisory time of PA/NP/Med student/Med Resident etc but could involve care discussion time.  Rush Farmer, M.D. [facility] Pulmonary/Critical Care Medicine. Pager: [phone]. After hours pager: [phone].

## 2015-08-19 NOTE — Progress Notes (Signed)
[place] for Enoxaparin  Indication: atrial fibrillation  Allergies  Allergen Reactions  . Budesonide Swelling    Possibly caused feet swelling    Patient Measurements: Height: 6' (182.9 cm) Weight: 225 lb 5 oz (102.2 kg) IBW/kg (Calculated) : 77.6  Vital Signs: Temp: 97.5 F (36.4 C) (08/15 1113) Temp Source: Oral (08/15 1113) BP: 139/90 (08/15 1514) Pulse Rate: 96 (08/15 1514)  Labs:  Recent Labs  08/17/15 0348 08/17/15 1940 08/18/15 0228 08/18/15 1131 08/19/15 0228  HGB 12.1*  --  12.0*  --  12.9*  HCT 36.5*  --  36.7*  --  39.1  PLT 187  --  167  --  188  CREATININE 1.51* 1.40* 1.28* 1.36*  --     Estimated Creatinine Clearance: 67.8 mL/min (by C-G formula based on SCr of 1.36 mg/dL).   Medical History: Past Medical History:  Diagnosis Date  . Abdominal aortic aneurysm (AAA) ([place]) 03/07/2015  . Bipolar disorder ([place])    Takes Lamictal & Seroquel  . Current smoker    2 packs per day  . Dysrhythmia    Atrial Fibrillation  . Emphysema   . GERD (gastroesophageal reflux disease)   . High blood pressure   . Hyperlipemia   . Non-small cell carcinoma of lung, stage 1 ([place]) 02/24/2015  . Pneumonia    hx of  . Shortness of breath dyspnea     Medications:  Prescriptions Prior to Admission  Medication Sig Dispense Refill Last Dose  . albuterol (PROVENTIL HFA;VENTOLIN HFA) 108 (90 BASE) MCG/ACT inhaler Inhale 2 puffs into the lungs every 6 (six) hours as needed for wheezing or shortness of breath. 1 Inhaler 6 08/15/2015  . budesonide (PULMICORT) 0.5 MG/2ML nebulizer solution Take 0.5 mg by nebulization 2 (two) times daily.  1 08/14/2015  . carvedilol (COREG) 12.5 MG tablet Take 1 tablet (12.5 mg total) by mouth 2 (two) times daily. 62 tablet 11 08/16/2015 at maybe 5am or 6am  . ipratropium-albuterol (DUONEB) 0.5-2.5 (3) MG/3ML SOLN Take by nebulization 2 (two) times daily.  0 08/14/2015  . lamoTRIgine (LAMICTAL) 200 MG tablet  Take 200 mg by mouth every evening. 7 pm   08/14/2015  . losartan (COZAAR) 50 MG tablet Take 50 mg by mouth every evening. 7pm   08/14/2015  . OXYGEN Inhale 5 L into the lungs continuous.   08/15/2015 at Unknown time  . pravastatin (PRAVACHOL) 20 MG tablet Take 20 mg by mouth at bedtime.    08/14/2015  . QUEtiapine (SEROQUEL) 100 MG tablet Take 50-100 mg by mouth at bedtime.    08/14/2015 at Unknown time  . RABEprazole (ACIPHEX) 20 MG tablet Take 20 mg by mouth every evening. 7pm   08/14/2015  . rivaroxaban (XARELTO) 20 MG TABS tablet Take 1 tablet (20 mg total) by mouth daily with supper. (Patient taking differently: Take 20 mg by mouth at bedtime. 11 pm) 30 tablet 11 08/14/2015  . umeclidinium-vilanterol (ANORO ELLIPTA) 62.5-25 MCG/INH AEPB Inhale 1 puff into the lungs daily. (Patient not taking: Reported on 08/16/2015) 60 each 5 Not Taking at Unknown time   Scheduled:  . antiseptic oral rinse  7 mL Mouth Rinse q12n4p  . chlorhexidine  15 mL Mouth Rinse BID  . enoxaparin (LOVENOX) injection  100 mg Subcutaneous Q12H  . furosemide  40 mg Intravenous Q8H  . guaiFENesin  600 mg Oral BID  . insulin aspart  0-15 Units Subcutaneous TID WC  . insulin aspart  0-5 Units Subcutaneous  QHS  . ipratropium-albuterol  3 mL Nebulization Q6H  . lamoTRIgine  100 mg Oral Q1200  . losartan  50 mg Oral Q1200  . methylPREDNISolone sodium succinate  80 mg Intravenous Q8H  . metoprolol tartrate  25 mg Oral BID  . pantoprazole  40 mg Oral Daily  . pravastatin  20 mg Oral Q1200  . QUEtiapine  100 mg Oral QHS   Infusions:   PRN: sodium chloride  Assessment: Patients is a 65 year old male admitted 08/15/2015 with dyspnea, volume overload, and possible sepsis. Patient is on rivaroxaban prior to admission for a fib, and his last dose was Thursday per nurse.   Patient's hgb 12.9, pltc 188 and creatinine clearance is >30 mL/min. Will start patient on enoxaparin '100mg'$  q12h. No bleeding noted.   Goal of Therapy:   Anticoagulation    Plan:  Continue enoxaparin '100mg'$  [place] every 12 hours Monitor renal function and CBC  Follow up plan for transitioning back to PTA rivaroxaban   Demetrius Charity, PharmD Acute Care Pharmacy Resident  Pager: [phone] 08/19/2015

## 2015-08-20 ENCOUNTER — Inpatient Hospital Stay (HOSPITAL_COMMUNITY): Payer: BLUE CROSS/BLUE SHIELD

## 2015-08-20 ENCOUNTER — Telehealth: Payer: Self-pay | Admitting: Medical Oncology

## 2015-08-20 DIAGNOSIS — J9621 Acute and chronic respiratory failure with hypoxia: Secondary | ICD-10-CM

## 2015-08-20 DIAGNOSIS — Z7189 Other specified counseling: Secondary | ICD-10-CM

## 2015-08-20 DIAGNOSIS — Z515 Encounter for palliative care: Secondary | ICD-10-CM

## 2015-08-20 LAB — GLUCOSE, CAPILLARY
GLUCOSE-CAPILLARY: 143 mg/dL — AB (ref 65–99)
GLUCOSE-CAPILLARY: 198 mg/dL — AB (ref 65–99)
GLUCOSE-CAPILLARY: 165 mg/dL — AB (ref 65–99)
Glucose-Capillary: 207 mg/dL — ABNORMAL HIGH (ref 65–99)
Glucose-Capillary: 180 mg/dL — ABNORMAL HIGH (ref 65–99)

## 2015-08-20 LAB — CULTURE, BLOOD (ROUTINE X 2)
CULTURE: NO GROWTH
Culture: NO GROWTH

## 2015-08-20 MED ORDER — FUROSEMIDE 10 MG/ML IJ SOLN
40.0000 mg | Freq: Three times a day (TID) | INTRAMUSCULAR | Status: AC
  Administered 2015-08-20 (×2): 40 mg via INTRAVENOUS
  Filled 2015-08-20 (×3): qty 4

## 2015-08-20 MED ORDER — RIVAROXABAN 20 MG PO TABS
20.0000 mg | ORAL_TABLET | Freq: Every day | ORAL | Status: DC
  Administered 2015-08-20 – 2015-08-21 (×2): 20 mg via ORAL
  Filled 2015-08-20 (×3): qty 1

## 2015-08-20 MED ORDER — RIVAROXABAN 20 MG PO TABS: 20.0000 mg | ORAL_TABLET | Freq: Every day | ORAL | Status: DC

## 2015-08-20 NOTE — Progress Notes (Signed)
Daily Progress Note   Patient Name: Gerald Ho       Date: 08/20/2015 DOB: September 30, 1950  Age: 65 y.o. MRN#: [id number] Attending Physician: Raylene Miyamoto, MD Primary Care Physician: Maryella Shivers, MD Admit Date: 08/15/2015  Reason for Consultation/Follow-up: Establishing goals of care  Subjective: I met again today with Gerald Ho. He is sitting in recliner, in good spirits. We continue our conversation regarding his [place]. He confirms desire for DNR and I will put this in place. He says he did speak with his family regarding DNR and hospice. Still hopeful for home with hospice. Using BiPAP at night. He speaks a lot about his brother and I ask him who he would want as his HCPOA and he desires for his daughter and brother to work together (he does not want to put all this on his daughter). He has not utilized morphine prn - educated about the use of morphine. He feels good today.   Length of Stay: 5  Current Medications: Scheduled Meds:  . antiseptic oral rinse  7 mL Mouth Rinse q12n4p  . chlorhexidine  15 mL Mouth Rinse BID  . furosemide  40 mg Intravenous Q8H  . guaiFENesin  600 mg Oral BID  . insulin aspart  0-15 Units Subcutaneous TID WC  . insulin aspart  0-5 Units Subcutaneous QHS  . ipratropium-albuterol  3 mL Nebulization Q6H  . lamoTRIgine  100 mg Oral Q1200  . losartan  50 mg Oral Q1200  . methylPREDNISolone sodium succinate  80 mg Intravenous Q8H  . metoprolol tartrate  25 mg Oral BID  . pantoprazole  40 mg Oral Daily  . pravastatin  20 mg Oral Q1200  . QUEtiapine  100 mg Oral QHS  . rivaroxaban  20 mg Oral Q supper    Continuous Infusions:    PRN Meds: sodium chloride, morphine  Physical Exam          Vital Signs: BP (!) 172/115 (BP Location: Left Arm)   Pulse 92    Temp 98 F (36.7 C) (Oral)   Resp (!) 24   Ht 6' (1.829 m)   Wt 101 kg (222 lb 10.6 oz)   SpO2 90%   BMI 30.20 kg/m  SpO2: SpO2: 90 % O2 Device: O2 Device: Nasal Cannula (high flow) O2 Flow Rate: O2 Flow Rate (  L/min): 12 L/min  Intake/output summary:  Intake/Output Summary (Last 24 hours) at 08/20/15 1239 Last data filed at 08/20/15 1100  Gross per 24 hour  Intake              440 ml  Output             2625 ml  Net            -2185 ml   LBM: Last BM Date: 08/19/15 Baseline Weight: Weight: 105.5 kg (232 lb 9.4 oz) Most recent weight: Weight: 101 kg (222 lb 10.6 oz)       Palliative Assessment/Data: PPS: 30%   Flowsheet Rows   Flowsheet Row Most Recent Value  Intake Tab  Referral Department  Critical care  Unit at Time of Referral  ICU  Palliative Care Primary Diagnosis  Cardiac  Date Notified  08/19/15  Palliative Care Type  New Palliative care  Reason for referral  Clarify Goals of Care  Date of Admission  08/15/15  Date first seen by Palliative Care  08/19/15  # of days Palliative referral response time  0 Day(s)  # of days IP prior to Palliative referral  4  Clinical Assessment  Psychosocial & Spiritual Assessment  Palliative Care Outcomes      Patient Active Problem List   Diagnosis Date Noted  . Palliative care encounter   . DNR (do not resuscitate) discussion   . Chronic anticoagulation 08/18/2015  . Pulmonary hypertension ([place]) 08/18/2015  . HCAP (healthcare-associated pneumonia)   . Acute kidney injury (nontraumatic) ([place]) 08/17/2015  . Acute on chronic respiratory failure ([place]) 08/16/2015  . Pneumonia 08/15/2015  . COPD exacerbation ([place]) 04/04/2015  . Dyspnea 04/04/2015  . Abdominal aortic aneurysm (AAA) ([place]) 03/07/2015  . Non-small cell carcinoma of lung, stage 1 ([place]) 02/24/2015  . Chest pain 01/31/2015  . Mediastinal lymphadenopathy 01/29/2015  . Tobacco abuse disorder 08/02/2014  . Chronic systolic congestive heart failure ([place])  01/22/2014  . Encounter for therapeutic drug monitoring 12/17/2013  . Atrial fibrillation with RVR ([place]) 12/11/2013  . COPD (chronic obstructive pulmonary disease) ([place]) 10/03/2012  . Hyponatremia 02/23/2011  . ETOH abuse 02/23/2011  . Altered mental status 02/23/2011  . Diastolic CHF, acute on chronic ([facility]) 02/23/2011  . Pulmonary edema, acute ([place]) 02/23/2011    Palliative Care Assessment & Plan   Patient Profile: 65 y.o. 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 Bipolar, smoker, GERD, Atrial fib on Xarelto, HTN HLD admitted on 08/15/2015 with worsening dyspnea since June when completed radiation so concern for COPD exacerbation with radiation induced pneumonitis. Has had increased oxygen requirements since admission.   Assessment: Sitting in recliner, good spirits.   Recommendations/Plan:  Dyspnea: Morphine 2.5 mg every 4 hours prn.   Goals of Care and Additional Recommendations:  Limitations on Scope of Treatment: Avoid Hospitalization  Code Status:    Code Status Orders        Start     Ordered   08/20/15 1239  Do not attempt resuscitation (DNR)  Continuous    Question Answer Comment  In the event of cardiac or respiratory ARREST Do not call a "code blue"   In the event of cardiac or respiratory ARREST Do not perform Intubation, CPR, defibrillation or ACLS   In the event of cardiac or respiratory ARREST Use medication by any route,  position, wound care, and other measures to relive pain and suffering. May use oxygen, suction and manual treatment of airway obstruction as needed for comfort.      08/20/15 1238    Code Status History    Date Active Date Inactive Code Status Order ID Comments User Context   08/19/2015  2:07 PM 08/20/2015 12:38 PM Partial Code [id number]  Rush Farmer, MD Inpatient   08/15/2015  7:34 PM 08/19/2015   2:07 PM Full Code [id number]  Luz Brazen, MD Inpatient   02/22/2011  7:03 PM 02/26/2011  4:39 PM Full Code [id number]  Elsie Stain, MD Inpatient       Prognosis:   < 6 months  Discharge Planning:  Home with Hospice  Thank you for allowing the Palliative Medicine Team to assist in the care of this patient.   Time In: 1225 Time Out: 1245 Total Time 33mn Prolonged Time Billed  no       Greater than 50%  of this time was spent counseling and coordinating care related to the above assessment and plan.  PPershing Proud NP  Please contact Palliative Medicine Team phone at [phone] questions and concerns.

## 2015-08-20 NOTE — Telephone Encounter (Signed)
Asked me to have Dr Julien Nordmann review his CT scan . Per Julien Nordmann I instructed pt that his CT chest shows mediastinal lymphadenopathy is  stable  . The scan shows advanced emphysema.

## 2015-08-20 NOTE — Care Management Note (Addendum)
Case Management Note  Patient Details  Name: Gerald Ho MRN: [id number] Date of Birth: June 05, 1950  Subjective/Objective:      Case Management Note  Patient Details  Name: Gerald Ho MRN: [id number] Date of Birth: 05-01-1950  Subjective/Objective:        Pt admitted in resp distress - s/p Lung CA radiation            Action/Plan:  PTA from home alone - per pt he was able to do all ADLs.  On home O2 before current event 2 -5 liters (latter for exertion) .  Prior to this admit pt was using two separate O2 tanks at 5 liters each to attempt to achieve 10 liters total continuous.  Pt receives oxygen from [facility] - attending made aware of home O2 need at home.  Currently pt is flucuating between BIPAP and HF [place] 12-15 liters.  CM will continue to follow for discharge needs      Expected Discharge Date:                  Expected Discharge Plan:  Home w Hospice Care  In-House Referral:     Discharge planning Services  CM Consult  Post Acute Care Choice:    Choice offered to:     DME Arranged:    DME Agency:     HH Arranged:    HH Agency:     Status of Service:  In process, will continue to follow  If discussed at Long Length of Stay Meetings, dates discussed:    Additional Comments: Palliative team consulted and has determined with pt that Discharge plan is for pt to discharge home with hospice.   Pt is very appropriate, alert and oriented.  CM Provided choice for [facility] with hospice - pt quickly chose [facility].  CM contacted agency and provided tentative referral.  CM text paged/called  Palliative team to request [place] order.   Pt denied needing any equipment in the home - states he already has walker and can and will borrow 3:1 from neighbor if the need arises.  Pt is home alone but stated that he will have strong support from his neighbor, brother and neighbors daughters - daughter is very supportive however she lives out of town .  He stated that if he needed to pay the  neighbors daughters to be with him on continuous basis that he could do that as well - denied needing private pay list.    Pt is still requiring a higher need of O2 for a safe discharge home.  CM will continue to follow for discharge needs [name], RN 08/20/2015, 2:47 PM               A

## 2015-08-20 NOTE — Progress Notes (Signed)
PULMONARY / CRITICAL CARE MEDICINE   Name: AARIT KASHUBA MRN: [id number] DOB: May 16, 1950    ADMISSION DATE:  08/15/2015  CHIEF COMPLAINT:  Dyspnea  BRIEF  Mr. Baiz is an 65 y.o. 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 He also had a nodule in the LUL which was treated concurrently as there was concern for synchronous primary lung CA. He is on 5+ L of O2 at home due to COPD/emphysemia.  He has had some dyspnea since his radiation completed in June, but a CT at that time was not suggestive of radiation-induced lung injury. 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   STUDIES:  CXR: Bibasilar prominent interstitial pattern CT chest 8/14 > Increased interstitial type opacities intervening airspace opacity in the superior segment of the right lower lobe with associated volume loss. This may reflect radiation induced fibrosis progressing since the prior study. Pneumonia is possible. 8/11 RUL [place] > cholelithiasis and no evidence for cholecystitis. Possible early cirrhosis.   CULTURES: Blood 8/11 >> In process Urine 8/11 >> In process Lower Resp 8/11>> In process  ANTIBIOTICS: Vanc 8/11 >>8/12 Zosyn 8/11 >>8/13  LINES/TUBES: PIVs  EVENTS   SUBJECTIVE/OVERNIGHT/INTERVAL HX No acute events overnight, still with high O2 demands on HFNC. Met with palliative yesterday with plans for discharge to home hospice.  VITAL SIGNS: BP (!) 169/97 (BP Location: Left Arm)   Pulse 91   Temp 98.2 F (36.8 C) (Oral)   Resp 14   Ht 6' (1.829 m)   Wt 101 kg (222 lb 10.6 oz)   SpO2 90%   BMI 30.20 kg/m   HEMODYNAMICS:    VENTILATOR SETTINGS: FiO2 (%):  [50 %] 50 %  INTAKE / OUTPUT: I/O last 3 completed shifts: In: 440 [P.O.:440] Out: 4075 [Urine:4075]  PHYSICAL EXAMINATION: General:  Sitting in chair  looks better, off bipap. Neuro:  Awake, alert, fully oriented. HEENT:  MMM. Cardiovascular: RRR, no MRG Lungs: dense rales throughout Abdomen:  Obese abdomen Musculoskeletal:  Moderate clubbing noted Skin:  Palmar erythema  LABS:  PULMONARY  Recent Labs Lab 08/15/15 2021  PHART 7.352  PCO2ART 35.2  PO2ART 79.0*  HCO3 19.6*  TCO2 21  O2SAT 95.0    CBC  Recent Labs Lab 08/17/15 0348 08/18/15 0228 08/19/15 0228  HGB 12.1* 12.0* 12.9*  HCT 36.5* 36.7* 39.1  WBC 5.6 5.2 5.6  PLT 187 167 188    COAGULATION  Recent Labs Lab 08/15/15 1946  INR 1.83    CARDIAC    Recent Labs Lab 08/15/15 1946 08/16/15 0138 08/16/15 0859  TROPONINI 0.03* 0.04* 0.03*   No results for input(s): PROBNP in the last 168 hours.   CHEMISTRY  Recent Labs Lab 08/15/15 1946 08/17/15 0348 08/17/15 1940 08/18/15 0228 08/18/15 1131 08/19/15 0228  NA 129* 132* 130* 131* 134*  --   K 4.4 4.0 4.0 4.1 4.1  --   CL 99* 94* 93* 97* 96*  --   CO2 20* 26 25 26 26   --   GLUCOSE 151* 147* 178* 143* 161*  --   BUN 5* 15 14 14 15   --   CREATININE 1.05 1.51* 1.40* 1.28* 1.36*  --   CALCIUM 8.8* 8.8* 8.9 8.8* 9.2  --   MG 1.4* 2.0  --  2.0  --  2.0  PHOS 3.4 3.8  --  3.8  --  3.8   Estimated Creatinine Clearance: 67.5 mL/min (by C-G formula based on SCr of 1.36 mg/dL).   LIVER  Recent Labs Lab 08/15/15 1946  AST 16  ALT 14*  ALKPHOS 80  BILITOT 1.3*  PROT 6.7  ALBUMIN 3.7  INR 1.83     INFECTIOUS  Recent Labs Lab 08/15/15 1946 08/15/15 2304 08/17/15 1605 08/18/15 0228 08/19/15 0228  LATICACIDVEN 1.5 1.9  --   --   --   PROCALCITON  --   --  <0.10 <0.10 <0.10     ENDOCRINE CBG (last 3)   Recent Labs  08/19/15 1702 08/19/15 2258 08/20/15 0721  GLUCAP 155* 202* 143*   IMAGING x48h  - image(s) personally visualized  -   highlighted in bold Dg Chest Port 1 View  Result Date: 08/20/2015 CLINICAL DATA:  65 y/o  M; respiratory failure. EXAM: PORTABLE  CHEST 1 VIEW COMPARISON:  Chest radiograph dated 08/19/2015. FINDINGS: Stable cardiac silhouette given differences in technique. No acute osseous abnormality is evident. No pneumothorax. Interstitial pulmonary edema and small effusions with bibasilar atelectasis, mildly improved from the prior study. IMPRESSION: Interstitial pulmonary edema and small effusions with basilar atelectasis mildly improved in comparison with prior radiograph. Electronically Signed   By: Kristine Garbe M.D.   On: 08/20/2015 06:48   Dg Chest Port 1 View  Result Date: 08/19/2015 CLINICAL DATA:  Respiratory failure. EXAM: PORTABLE CHEST 1 VIEW COMPARISON:  08/18/2015.  01/31/2015. FINDINGS: Cardiomegaly with pulmonary venous congestion and bilateral pulmonary infiltrates again noted without interim change. Findings consistent with bilateral pulmonary edema. Small bilateral pleural effusions. Costophrenic angles not completely imaged. No pneumothorax. Underlying chronic interstitial lung disease. IMPRESSION: 1. Persistent changes of congestive heart failure with bilateral pulmonary edema and small pleural effusions. No interim change from prior exam. 2.  Underlying chronic interstitial lung disease. Electronically Signed   By: Marcello Moores  Register   On: 08/19/2015 07:03     Intake/Output Summary (Last 24 hours) at 08/20/15 1005 Last data filed at 08/20/15 0800  Gross per 24 hour  Intake              440 ml  Output             2200 ml  Net            -1760 ml     DISCUSSION: 65 y/o M w/ hx of Tobacco and EtOH use, lung CA s/p stereotactic rads. P/w dyspnea and hypoxia and volume overload. 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 Now on HFNC 15L depsite diuresis, ABX, steroids, and nebs. Plan to discharge to home hospice.   ASSESSMENT / PLAN:  PULMONARY A: Acute on chronic Hypoxic Respiratory failure - etiology not entirely clear. 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  Severe COPD without exacerbation (on home O2) Lung CA, s/p  curative rads (75month out)  P:   QHS BiPAP Continue steroids 80q8 Ipratroprium / albuterol nebs q4. Add mucinex at pt request  CARDIOVASCULAR A:  Hx of AF -compliant per daughter with xarelto CHF - chronic systolic ef 319%in [id number] Hypertension  P:  Telemtery Restart Xarelto 8/16 and DC enoxaparin.  Continue home anti-hypertensives for afterload reduction. Diuresed will 8/15 > will continue lasix 8/16  RENAL A:   Hypomagnesmia  P:   Repleted mag  GASTROINTESTINAL A:   Possible liver disease (palmar erythema, elev. Bilirubin / INR) - possible cirrhosis on [place]8/11  P:   Fu as opt needed if desired.  HEMATOLOGIC A:   Anticoagulated  at home for AF  P:  Monitor  INFECTIOUS A:   Possible PNA, MRSA  pcr negative PCT 0.22   P:   Off ABX Monitor  ENDOCRINE A:   No acute issues  P:    Follow glucose on chemistries  NEUROLOGIC A:   At risk for ETOH w/d > no s/s as of 8/15 Deconditioning  P:   Monitor, no WD as of yet PT/OT consults   FAMILY  - Updates: Patient updated 8/16. He has made decision for home hospice.    - Inter-disciplinary family meet or Palliative Care meeting due by:  8/18   Will transfer to telemetry unit today and TRH, plan for home hospice, unclear when he will be able to go.   Georgann Housekeeper, AGACNP-BC [facility] Pager [phone] or [phone]  08/20/2015 10:17 AM  Attending Note:  65 year old male with ES-COPD and lung cancer who presents in acute on chronic hypoxemic respiratory failure.  On exam, diminished BS diffusely.  I reviewed CXR myself, hyperinflation and fibrotic changes noted.  Discussed with PCCM-NP.  Acute on chronic respiratory failure, hypoxemia:  - Titrate O2, target is 10L and sat of 88%.  - Arrange for home O2 at that level.  Pulmonary edema:  - Continue diureses as ordered.  - F/U CXR as needed.  ?HCAP:  - D/C zosyn.  - Levaquin PO with stop date as ordered.  -  F/U on culture.  GOC:  - Palliative care following.  - Arrange for home with home hospice.  Transfer to SDU for nocturnal BiPAP.  Transfer care to [facility] and PCCM off 8/17.  Patient seen and examined, agree with above note.  I dictated the care and orders written for this patient under my direction.  Rush Farmer, MD [phone]  GOC:

## 2015-08-21 ENCOUNTER — Telehealth: Payer: Self-pay | Admitting: Medical Oncology

## 2015-08-21 DIAGNOSIS — Z66 Do not resuscitate: Secondary | ICD-10-CM

## 2015-08-21 LAB — GLUCOSE, CAPILLARY
GLUCOSE-CAPILLARY: 207 mg/dL — AB (ref 65–99)
GLUCOSE-CAPILLARY: 176 mg/dL — AB (ref 65–99)
Glucose-Capillary: 142 mg/dL — ABNORMAL HIGH (ref 65–99)
Glucose-Capillary: 150 mg/dL — ABNORMAL HIGH (ref 65–99)
Glucose-Capillary: 214 mg/dL — ABNORMAL HIGH (ref 65–99)

## 2015-08-21 LAB — CBC
HCT: 40.6 % (ref 39.0–52.0)
Hemoglobin: 13.5 g/dL (ref 13.0–17.0)
MCH: 34.1 pg — ABNORMAL HIGH (ref 26.0–34.0)
MCHC: 33.3 g/dL (ref 30.0–36.0)
MCV: 102.5 fL — AB (ref 78.0–100.0)
PLATELETS: 177 10*3/uL (ref 150–400)
RBC: 3.96 MIL/uL — ABNORMAL LOW (ref 4.22–5.81)
RDW: 13.8 % (ref 11.5–15.5)
WBC: 6.4 10*3/uL (ref 4.0–10.5)

## 2015-08-21 LAB — BASIC METABOLIC PANEL
ANION GAP: 8 (ref 5–15)
BUN: 34 mg/dL — ABNORMAL HIGH (ref 6–20)
CALCIUM: 8.6 mg/dL — AB (ref 8.9–10.3)
CO2: 34 mmol/L — ABNORMAL HIGH (ref 22–32)
CREATININE: 1.5 mg/dL — AB (ref 0.61–1.24)
Chloride: 94 mmol/L — ABNORMAL LOW (ref 101–111)
GFR calc Af Amer: 55 mL/min — ABNORMAL LOW (ref >60)
GFR, EST NON AFRICAN AMERICAN: 47 mL/min — AB (ref >60)
GLUCOSE: 163 mg/dL — AB (ref 65–99)
Potassium: 3.8 mmol/L (ref 3.5–5.1)
Sodium: 136 mmol/L (ref 135–145)

## 2015-08-21 LAB — MAGNESIUM: Magnesium: 2 mg/dL (ref 1.7–2.4)

## 2015-08-21 NOTE — Telephone Encounter (Signed)
Daughter  Ria Clock called about  discharge plan. Is hospice arranging disposition? I updated daughter on discharge plans per Case manager note.

## 2015-08-21 NOTE — Progress Notes (Signed)
Daily Progress Note   Patient Name: Gerald Ho       Date: 08/21/2015 DOB: November 20, 1950  Age: 65 y.o. MRN#: [id number] Attending Physician: Roxan Hockey, MD Primary Care Physician: Maryella Shivers, MD Admit Date: 08/15/2015  Reason for Consultation/Follow-up: Establishing goals of care  Subjective: I met again today with Gerald Ho. He continues to be in good spirits. Plan is home with hospice tomorrow. I bring him Living Will per his request and we completed MOST form. MOST: DNR, comfort care with no return to the hospital (unless there is a reversible cause of declining health), consider risk/benefit of antibiotics, IVF for defined trial period, and no feeding tubes. Emotional support provided. He is still waiting to talk to his brother more and to help discuss with his daughter.   Length of Stay: 6  Current Medications: Scheduled Meds:  . antiseptic oral rinse  7 mL Mouth Rinse q12n4p  . chlorhexidine  15 mL Mouth Rinse BID  . guaiFENesin  600 mg Oral BID  . insulin aspart  0-15 Units Subcutaneous TID WC  . insulin aspart  0-5 Units Subcutaneous QHS  . ipratropium-albuterol  3 mL Nebulization Q6H  . lamoTRIgine  100 mg Oral Q1200  . losartan  50 mg Oral Q1200  . methylPREDNISolone sodium succinate  80 mg Intravenous Q8H  . metoprolol tartrate  25 mg Oral BID  . pantoprazole  40 mg Oral Daily  . pravastatin  20 mg Oral Q1200  . QUEtiapine  100 mg Oral QHS  . rivaroxaban  20 mg Oral Q supper    Continuous Infusions:    PRN Meds: sodium chloride, morphine  Physical Exam  Constitutional: He is oriented to person, place, and time. He appears well-developed and well-nourished.  HENT:  Head: Normocephalic and atraumatic.  Cardiovascular: Normal rate.  An irregularly irregular  rhythm present.  Pulmonary/Chest: No accessory muscle usage. No tachypnea. No respiratory distress.  Abdominal: Normal appearance.  Neurological: He is alert and oriented to person, place, and time.            Vital Signs: BP 127/81   Pulse (!) 106   Temp 98.7 F (37.1 C) (Oral)   Resp 18   Ht 6' (1.829 m)   Wt 100.7 kg (222 lb 0.1 oz)   SpO2 (!) 88%   BMI 30.11  kg/m  SpO2: SpO2: (!) 88 % O2 Device: O2 Device: High Flow Nasal Cannula O2 Flow Rate: O2 Flow Rate (L/min): 12 L/min  Intake/output summary:   Intake/Output Summary (Last 24 hours) at 08/21/15 1806 Last data filed at 08/21/15 1300  Gross per 24 hour  Intake             1430 ml  Output             1230 ml  Net              200 ml   LBM: Last BM Date: 08/21/15 Baseline Weight: Weight: 105.5 kg (232 lb 9.4 oz) Most recent weight: Weight: 100.7 kg (222 lb 0.1 oz)       Palliative Assessment/Data: PPS: 30%   Flowsheet Rows   Flowsheet Row Most Recent Value  Intake Tab  Referral Department  Critical care  Unit at Time of Referral  ICU  Palliative Care Primary Diagnosis  Cardiac  Date Notified  08/19/15  Palliative Care Type  New Palliative care  Reason for referral  Clarify Goals of Care  Date of Admission  08/15/15  Date first seen by Palliative Care  08/19/15  # of days Palliative referral response time  0 Day(s)  # of days IP prior to Palliative referral  4  Clinical Assessment  Psychosocial & Spiritual Assessment  Palliative Care Outcomes      Patient Active Problem List   Diagnosis Date Noted  . DNR (do not resuscitate)   . Palliative care encounter   . DNR (do not resuscitate) discussion   . Chronic anticoagulation 08/18/2015  . Pulmonary hypertension ([place]) 08/18/2015  . HCAP (healthcare-associated pneumonia)   . Acute kidney injury (nontraumatic) ([place]) 08/17/2015  . Acute on chronic respiratory failure ([place]) 08/16/2015  . Pneumonia 08/15/2015  . COPD exacerbation ([place]) 04/04/2015  .  Dyspnea 04/04/2015  . Abdominal aortic aneurysm (AAA) ([place]) 03/07/2015  . Non-small cell carcinoma of lung, stage 1 ([place]) 02/24/2015  . Chest pain 01/31/2015  . Mediastinal lymphadenopathy 01/29/2015  . Tobacco abuse disorder 08/02/2014  . Chronic systolic congestive heart failure ([place]) 01/22/2014  . Encounter for therapeutic drug monitoring 12/17/2013  . Atrial fibrillation with RVR ([place]) 12/11/2013  . COPD (chronic obstructive pulmonary disease) ([place]) 10/03/2012  . Hyponatremia 02/23/2011  . ETOH abuse 02/23/2011  . Altered mental status 02/23/2011  . Diastolic CHF, acute on chronic (H[facility]) 02/23/2011  . Pulmonary edema, acute ([place]) 02/23/2011    Palliative Care Assessment & Plan   Patient Profile: 65 y.o. 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 Bipolar, smoker, GERD, Atrial fib on Xarelto, HTN HLD admitted on 08/15/2015 with worsening dyspnea since June when completed radiation so concern for COPD exacerbation with radiation induced pneumonitis. Has had increased oxygen requirements since admission.   Assessment: Sitting in recliner, good spirits.   Recommendations/Plan:  Dyspnea: Morphine 2.5 mg every 4 hours prn.   Goals of Care and Additional Recommendations:  Limitations on Scope of Treatment: Avoid Hospitalization  Code Status:    Code Status Orders        Start     Ordered   08/20/15 1239  Do not attempt resuscitation (DNR)  Continuous    Question Answer Comment  In the event of cardiac or respiratory ARREST Do not call a "code blue"   In  the event of cardiac or respiratory ARREST Do not perform Intubation, CPR, defibrillation or ACLS   In the event of cardiac or respiratory ARREST Use medication by any route, position, wound care, and other measures to relive pain and suffering. May use oxygen, suction and manual  treatment of airway obstruction as needed for comfort.      08/20/15 1238    Code Status History    Date Active Date Inactive Code Status Order ID Comments User Context   08/19/2015  2:07 PM 08/20/2015 12:38 PM Partial Code [id number]  Rush Farmer, MD Inpatient   08/15/2015  7:34 PM 08/19/2015  2:07 PM Full Code [id number]  Luz Brazen, MD Inpatient   02/22/2011  7:03 PM 02/26/2011  4:39 PM Full Code [id number]  Elsie Stain, MD Inpatient       Prognosis:   < 6 months  Discharge Planning:  Home with Hospice  Thank you for allowing the Palliative Medicine Team to assist in the care of this patient.   Time In: 1400 Time Out: 1425 Total Time 53mn Prolonged Time Billed  no       Greater than 50%  of this time was spent counseling and coordinating care related to the above assessment and plan.  PPershing Proud NP  Please contact Palliative Medicine Team phone at [phone] questions and concerns.

## 2015-08-21 NOTE — Evaluation (Signed)
Physical Therapy Evaluation/ Discharge Patient Details Name: Gerald Ho MRN: [id number] DOB: [birth year] Today's Date: 08/21/2015   History of Present Illness  65 yo admitted with acute on chronic respiratory failure with lung CA s/p radiation on home O2. PMHx: AAA, bipolar, GERD, HTN, emphysea, HLD  Clinical Impression  Gerald Ho was in chair on arrival and is eager to return home. On 12L HFNC at rest with conversation sats 84-91%. With gait tank on 15L and sats maintained at 96% with HR 96. Pt verbalizing current need for energy conservation techniques and has already been performing at home . Pt able to bath, dress, ambulate and perform minimal chores with frequent rests. Pt encouraged to continued frequent shorts bouts of function and would benefit from aide for housework but does not demonstrate need for physical therapy at this time given pt with limited oxygen capacity and plan for home with hospice. Recommend daily ambulation with nursing and no further acute needs.     Follow Up Recommendations No PT follow up, aide for homemaking     Equipment Recommendations  None recommended by PT    Recommendations for Other Services       Precautions / Restrictions Precautions Precautions: Other (comment) Precaution Comments: watch sats Restrictions Weight Bearing Restrictions: No      Mobility  Bed Mobility               General bed mobility comments: in recliner on arrival  Transfers Overall transfer level: Modified independent                  Ambulation/Gait Ambulation/Gait assistance: Modified independent (Device/Increase time) Ambulation Distance (Feet): 100 Feet Assistive device: None Gait Pattern/deviations: Step-through pattern;Decreased stride length   Gait velocity interpretation: at or above normal speed for age/gender    Stairs            Wheelchair Mobility    Modified Rankin (Stroke Patients Only)       Balance Overall balance  assessment: No apparent balance deficits (not formally assessed)                                           Pertinent Vitals/Pain Pain Assessment: No/denies pain    Home Living Family/patient expects to be discharged to:: Private residence Living Arrangements: Alone   Type of Home: House Home Access: Ramped entrance     Home Layout: One level Home Equipment: Shower seat - built in      Prior Function Level of Independence: Independent         Comments: uses O2 and was limited with function needing frequent breaks due to fatigue     Hand Dominance        Extremity/Trunk Assessment   Upper Extremity Assessment: Overall WFL for tasks assessed           Lower Extremity Assessment: Overall WFL for tasks assessed      Cervical / Trunk Assessment: Normal  Communication   Communication: No difficulties  Cognition Arousal/Alertness: Awake/alert Behavior During Therapy: WFL for tasks assessed/performed Overall Cognitive Status: Within Functional Limits for tasks assessed                      General Comments      Exercises        Assessment/Plan    PT Assessment Patent does not need any further PT  services  PT Diagnosis Difficulty walking   PT Problem List    PT Treatment Interventions     PT Goals (Current goals can be found in the Care Plan section) Acute Rehab PT Goals Patient Stated Goal: return home PT Goal Formulation: All assessment and education complete, DC therapy    Frequency     Barriers to discharge        Co-evaluation               End of Session Equipment Utilized During Treatment: Oxygen Activity Tolerance: Patient tolerated treatment well Patient left: in chair;with call Armond/phone within Ho;with nursing/sitter in room Nurse Communication: Mobility status         Time: 5102-5852 PT Time Calculation (min) (ACUTE ONLY): 15 min   Charges:   PT Evaluation $PT Eval Low Complexity: 1  Procedure     PT G CodesMelford Ho 08/21/2015, 10:05 AM Gerald Ho, [place]

## 2015-08-21 NOTE — Care Management Note (Addendum)
Case Management Note  Patient Details  Name: Gerald Ho MRN: [id number] Date of Birth: [birth year]  Subjective/Objective:      Case Management Note  Patient Details  Name: Gerald Ho MRN: [id number] Date of Birth: [birth year]  Subjective/Objective:        Pt admitted in resp distress - s/p Lung CA radiation            Action/Plan:  PTA from home alone - per pt he was able to do all ADLs.  On home O2 before current event 2 -5 liters (latter for exertion) .  Prior to this admit pt was using two separate O2 tanks at 5 liters each to attempt to achieve 10 liters total continuous.  Pt receives oxygen from [facility] - attending made aware of home O2 need at home.  Currently pt is flucuating between BIPAP and HF [place] 12-15 liters.  CM will continue to follow for discharge needs      Expected Discharge Date:                  Expected Discharge Plan:  Home w Hospice Care  In-House Referral:     Discharge planning Services  CM Consult  Post Acute Care Choice:    Choice offered to:     DME Arranged:    DME Agency:     HH Arranged:    HH Agency:     Status of Service:  In process, will continue to follow  If discussed at Long Length of Stay Meetings, dates discussed:    Additional Comments: 08/21/2015  BIPAP order faxed.  Per attending - discharge tomorrow 08/22/15  Liaison verified she received the required documents.  Michelle from [facility] is at bedside with pt.  Liason stated agency will accept pt at current condition - agency will accept pt up to 15L [name].  CM will ask for BIPAP order.  CM informed attending that [name] can accept pt at current state - attending to follow up with Palliative.   CM faxed [place], H&P, progress notes including palliative care, home with hospice order to [facility] attention [name].  Pt currently requiring 12 liter on [place].   08/20/15 Palliative team to place home with hospice order -  Informed that pt chose [name].  Palliative team consulted and has determined with pt that Discharge plan is for pt to discharge home with hospice.   Pt is very appropriate, alert and oriented.  CM Provided choice for [name] with hospice - pt quickly chose [name].  CM contacted agency and provided tentative referral - agency to visit with pt tomorrow am - aware of oxygen need including HS BIPAP.Marland Kitchen  CM text paged/called  Palliative team to request [place] order.   Pt denied needing any equipment in the home - states he already has walker and can and will borrow 3:1 from neighbor if the need arises.  Pt is home alone but stated that he will have strong support from his neighbor, brother and neighbors daughters - daughter is very supportive however she lives out of town .  He stated that if he needed to pay the neighbors daughters to be with him on continuous basis that he could do that as well - denied needing private pay list.    Pt is still requiring a higher need of O2 for a safe discharge home.  CM will continue to follow for discharge needs [name], RN 08/21/2015,  8:52 AM               A

## 2015-08-21 NOTE — Progress Notes (Signed)
Patient Demographics:    Gerald Ho, is a 65 y.o. male, DOB - 1950/08/09, DVV:[id number]  Admit date - 08/15/2015   Admitting Physician Brand Males, MD  Outpatient Primary MD for the patient is HODGES,FRANCISCO, MD  LOS - 6   No chief complaint on file.       Subjective:    Gerald Ho today has no fevers, no emesis,  No chest pain,  With cough/sob, desats with talking and positional change   Assessment  & Plan :    Principal Problem:   Acute on chronic respiratory failure (HCC) Active Problems:   COPD (chronic obstructive pulmonary disease) (HCC)   Atrial fibrillation with RVR (HCC)   Tobacco abuse disorder   Non-small cell carcinoma of lung, stage 1 (HCC)   Pneumonia   Acute kidney injury (nontraumatic) (HCC)   Chronic anticoagulation   Pulmonary hypertension (HCC)   HCAP (healthcare-associated pneumonia)   Palliative care encounter   DNR (do not resuscitate) discussion   DNR (do not resuscitate)   1)Acute and Chronic Hypoxic Respiratory Failure- unable to wean patient below 12 hours via nasal cannula, continue supplemental oxygen and bronchodilators, continue steroids and mucolytics, BiPAP daily at bedtime  2)Severe COPD- contributing to #1 above, patient has > 80-pack-year smoking, he continued to smoke just prior to admission this time around. No infectious pneumonia, likely component of radiation pneumonitis (patient had stereotactic radiation for lung cancer)  3)Afib/Chronic systolic dysfunction CHF-continue metoprolol for rate control, last known EF 35% from 2015, continue Xarelto  for anticoagulation. Continue Lasix  4)Liver- history of more than average EtOH use, abdominal ultrasound from 8/11 suggest possible cirrhosis, no encephalopathy, bilirubin elevated.   5)Social/ethics-DNR/DNI, discussed with Alinda Sierras from palliative care team, MOST form to be completed prior to discharge  with the help of palliative care team.   Gerald Ho has accepted patient, possible discharge home on 08/22/2015 on high flow oxygen.   Code Status : DNR   Disposition Plan  : Home with [facility] on 08/22/15  Consults  :  Palliative Care /Pulmonology   DVT Prophylaxis  :  Xarelto  Lab Results  Component Value Date   PLT 177 08/21/2015    Inpatient Medications  Scheduled Meds: . antiseptic oral rinse  7 mL Mouth Rinse q12n4p  . chlorhexidine  15 mL Mouth Rinse BID  . guaiFENesin  600 mg Oral BID  . insulin aspart  0-15 Units Subcutaneous TID WC  . insulin aspart  0-5 Units Subcutaneous QHS  . ipratropium-albuterol  3 mL Nebulization Q6H  . lamoTRIgine  100 mg Oral Q1200  . losartan  50 mg Oral Q1200  . methylPREDNISolone sodium succinate  80 mg Intravenous Q8H  . metoprolol tartrate  25 mg Oral BID  . pantoprazole  40 mg Oral Daily  . pravastatin  20 mg Oral Q1200  . QUEtiapine  100 mg Oral QHS  . rivaroxaban  20 mg Oral Q supper   Continuous Infusions:  PRN Meds:.sodium chloride, morphine    Anti-infectives    Start     Dose/Rate Route Frequency Ordered Stop   08/16/15 1000  vancomycin (VANCOCIN) IVPB 750 mg/150 ml premix  Status:  Discontinued     750 mg 150 mL/hr  over 60 Minutes Intravenous Every 12 hours 08/15/15 2116 08/16/15 0906   08/16/15 0230  piperacillin-tazobactam (ZOSYN) IVPB 3.375 g  Status:  Discontinued     3.375 g 12.5 mL/hr over 240 Minutes Intravenous Every 8 hours 08/15/15 2116 08/17/15 1142   08/15/15 2000  piperacillin-tazobactam (ZOSYN) IVPB 3.375 g     3.375 g 100 mL/hr over 30 Minutes Intravenous  Once 08/15/15 1951 08/15/15 2109   08/15/15 2000  vancomycin (VANCOCIN) 2,000 mg in sodium chloride 0.9 % 500 mL IVPB     2,000 mg 250 mL/hr over 120 Minutes Intravenous  Once 08/15/15 1951 08/15/15 2332        Objective:   Vitals:   08/21/15 1000 08/21/15 1001 08/21/15 1100 08/21/15 1210  BP: 114/70       Pulse: 99 96 93   Resp: 14  16   Temp:    97 F (36.1 C)  TempSrc:    Oral  SpO2: 91% 90% (!) 85%   Weight:      Height:        Wt Readings from Last 3 Encounters:  08/21/15 100.7 kg (222 lb 0.1 oz)  06/12/15 100.7 kg (221 lb 14.4 oz)  05/06/15 100.1 kg (220 lb 11.2 oz)     Intake/Output Summary (Last 24 hours) at 08/21/15 1333 Last data filed at 08/21/15 1300  Gross per 24 hour  Intake             1420 ml  Output             1730 ml  Net             -310 ml     Physical Exam  Gen:- Awake Alert,  a HEENT:- .AT, No sclera icterus Neck-Supple Neck,No JVD,.  Lungs-  Diminished bil, few scattered wheezes CV- S1, S2 normal, irregular Abd-  +ve B.Sounds, Abd Soft, No tenderness,    Extremity/Skin:- + 1 ankle edema    Data Review:   Micro Results Recent Results (from the past 240 hour(s))  MRSA PCR Screening     Status: None   Collection Time: 08/15/15  6:55 PM  Result Value Ref Range Status   MRSA by PCR NEGATIVE NEGATIVE Final    Comment:        The GeneXpert MRSA Assay (FDA approved for NASAL specimens only), is one component of a comprehensive MRSA colonization surveillance program. It is not intended to diagnose MRSA infection nor to guide or monitor treatment for MRSA infections.   Culture, blood (routine x 2)     Status: None   Collection Time: 08/15/15  7:46 PM  Result Value Ref Range Status   Specimen Description BLOOD RIGHT ANTECUBITAL  Final   Special Requests BOTTLES DRAWN AEROBIC AND ANAEROBIC 10CC  Final   Culture NO GROWTH 5 DAYS  Final   Report Status 08/20/2015 FINAL  Final  Culture, blood (routine x 2)     Status: None   Collection Time: 08/15/15  7:54 PM  Result Value Ref Range Status   Specimen Description BLOOD LEFT ANTECUBITAL  Final   Special Requests BOTTLES DRAWN AEROBIC AND ANAEROBIC 10CC  Final   Culture NO GROWTH 5 DAYS  Final   Report Status 08/20/2015 FINAL  Final  Urine culture     Status: Abnormal   Collection Time:  08/15/15  9:12 PM  Result Value Ref Range Status   Specimen Description URINE, CLEAN CATCH  Final   Special Requests Immunocompromised  Final  Culture 2,000 COLONIES/mL INSIGNIFICANT GROWTH (A)  Final   Report Status 08/17/2015 FINAL  Final    Radiology Reports Ct Chest W Contrast  Result Date: 08/17/2015 CLINICAL DATA:  Shortness of breath.  History of lung carcinoma. EXAM: CT CHEST WITH CONTRAST TECHNIQUE: Multidetector CT imaging of the chest was performed during intravenous contrast administration. CONTRAST:  14m ISOVUE-300 IOPAMIDOL (ISOVUE-300) INJECTION 61% COMPARISON:  Chest radiograph, 08/15/2015.  Chest CT, 06/05/2015. FINDINGS: Neck base and axilla:  No mass or adenopathy. Cardiovascular: Heart is mildly enlarged. There are dense coronary artery calcifications. Calcified atherosclerotic plaque noted along the thoracic aorta and at the origin of the aortic arch branch vessels. No significant stenosis. Mediastinum/Nodes: Prominent to mildly enlarged mediastinal lymph nodes. There is a 12 mm short axis right peritracheal, azygos level node. Allowing for measurement technique differences, this is without change from the prior CT. No mediastinal or hilar masses. Lungs/Pleura: Hazy airspace opacity and irregular interstitial opacities are noted in the superior segment of the right upper lobe with associated volume loss. There is additional posterior right lower lobe opacity adjacent to a small right effusion. There is interstitial thickening in the posterior lateral aspect of the right upper lobe. Coarse reticular opacities and mild bronchiectasis is noted in the posterior medial left upper lobe. When compared the prior exam, the opacity in the superior segment of the right lower lobe has increased in the dependent atelectasis has developed. A small right effusion has increased in size. There are underlying changes of advanced emphysema. Upper Abdomen: Small gallstones. No liver mass on the  included field of view. No adrenal masses. No acute findings. Aortic atherosclerosis. Musculoskeletal: Mild wedge-shaped compression fracture of T12, chronic. Mild compression fracture of T5 also chronic. No osteoblastic or osteolytic lesions. IMPRESSION: 1. Increased interstitial type opacities intervening airspace opacity in the superior segment of the right lower lobe with associated volume loss. This may reflect radiation induced fibrosis progressing since the prior study. Pneumonia is possible. Small right pleural effusion is increased from the prior study as has dependent right lower lobe atelectasis. 2. No other significant change from prior exam. There stable areas of lung scarring and stable changes of advanced emphysema. Mild mediastinal adenopathy is stable. Electronically Signed   By: DLajean ManesM.D.   On: 08/17/2015 12:02   Dg Chest Port 1 View  Result Date: 08/20/2015 CLINICAL DATA:  65y/o  M; respiratory failure. EXAM: PORTABLE CHEST 1 VIEW COMPARISON:  Chest radiograph dated 08/19/2015. FINDINGS: Stable cardiac silhouette given differences in technique. No acute osseous abnormality is evident. No pneumothorax. Interstitial pulmonary edema and small effusions with bibasilar atelectasis, mildly improved from the prior study. IMPRESSION: Interstitial pulmonary edema and small effusions with basilar atelectasis mildly improved in comparison with prior radiograph. Electronically Signed   By: LKristine GarbeM.D.   On: 08/20/2015 06:48   Dg Chest Port 1 View  Result Date: 08/19/2015 CLINICAL DATA:  Respiratory failure. EXAM: PORTABLE CHEST 1 VIEW COMPARISON:  08/18/2015.  01/31/2015. FINDINGS: Cardiomegaly with pulmonary venous congestion and bilateral pulmonary infiltrates again noted without interim change. Findings consistent with bilateral pulmonary edema. Small bilateral pleural effusions. Costophrenic angles not completely imaged. No pneumothorax. Underlying chronic interstitial  lung disease. IMPRESSION: 1. Persistent changes of congestive heart failure with bilateral pulmonary edema and small pleural effusions. No interim change from prior exam. 2.  Underlying chronic interstitial lung disease. Electronically Signed   By: TMarcello Moores Register   On: 08/19/2015 07:03   Dg Chest Port 1 [id number]  Result Date: 08/18/2015 CLINICAL DATA:  Shortness of breath.  History of lung cancer. EXAM: PORTABLE CHEST 1 VIEW COMPARISON:  CT 08/17/2015.  Chest x-ray 11/2015 common 04/04/2015. FINDINGS: Cardiomegaly with slight pulmonary vascular prominence and bilateral progressive interstitial prominence suggesting congestive heart failure. Underlying chronic interstitial lung disease is present. Small right pleural effusion. No pneumothorax . IMPRESSION: 1. Cardiomegaly with mild pulmonary vascular prominence and increased interstitial prominence. Small right pleural effusion. Findings consistent with congestive heart failure. 2. Underlying chronic interstitial lung disease. Electronically Signed   By: Marcello Moores  Register   On: 08/18/2015 06:48   Dg Chest Port 1 View  Result Date: 08/15/2015 CLINICAL DATA:  65 year old male with cough. Lung cancer. Abnormal right lung and hilum since chest CT 06/05/2015. EXAM: PORTABLE CHEST 1 VIEW COMPARISON:  Radiographs 08/15/2015 and earlier. FINDINGS: Portable AP semi upright view at 2017 hours. Stable lung volumes. Stable cardiomegaly and mediastinal contours. Continued asymmetric increased opacity at the right hilum. Bilateral basilar predominant increased interstitial markings are stable. No pneumothorax or pleural effusion. No new pulmonary opacity. IMPRESSION: No new cardiopulmonary abnormality. Stable abnormal right lung and hilar findings as described yesterday and by CT on 06/05/2015. Electronically Signed   By: Genevie Ann M.D.   On: 08/15/2015 20:33   US Abdomen Limited Ruq  Result Date: 08/15/2015 CLINICAL DATA:  Initial evaluation for palmar erythema, concern  for cirrhosis. EXAM: US ABDOMEN LIMITED - RIGHT UPPER QUADRANT COMPARISON:  None. FINDINGS: Gallbladder: Several echogenic stones present within the gallbladder lumen, largest wedged measured approximately 2 mm. Gallbladder sludge present. Gallbladder wall measured at the upper limits of normal at 3 mm. No free pericholecystic fluid. No sonographic Murphy's sign was not exam, although the patient was medicated. Common bile duct: Diameter: 4.2 mm. Liver: No focal lesion identified. Diffusely increased echogenicity, most commonly associated with steatosis. No obvious changes related to cirrhosis identified. IMPRESSION: 1. Cholelithiasis with gallbladder sludge without sonographic evidence for acute cholecystitis. No biliary dilatation. 2. Increased and somewhat coarse echogenicity within the hepatic parenchyma. While this finding is often related to hepatic steatosis, possible early cirrhotic changes could also have this appearance. Correlation with the laboratory values recommended. Electronically Signed   By: Jeannine Boga M.D.   On: 08/15/2015 22:40     CBC  Recent Labs Lab 08/15/15 1946 08/17/15 0348 08/18/15 0228 08/19/15 0228 08/21/15 0206  WBC 3.1* 5.6 5.2 5.6 6.4  HGB 12.8* 12.1* 12.0* 12.9* 13.5  HCT 38.2* 36.5* 36.7* 39.1 40.6  PLT 204 187 167 188 177  MCV 102.1* 101.4* 101.9* 102.6* 102.5*  MCH 34.2* 33.6 33.3 33.9 34.1*  MCHC 33.5 33.2 32.7 33.0 33.3  RDW 14.4 14.3 14.3 14.4 13.8  LYMPHSABS 0.4* 0.5* 0.5*  --   --   MONOABS 0.1 0.3 0.2  --   --   EOSABS 0.0 0.0 0.0  --   --   BASOSABS 0.0 0.0 0.0  --   --     Chemistries   Recent Labs Lab 08/15/15 1946 08/17/15 0348 08/17/15 1940 08/18/15 0228 08/18/15 1131 08/19/15 0228 08/21/15 0206  NA 129* 132* 130* 131* 134*  --  136  K 4.4 4.0 4.0 4.1 4.1  --  3.8  CL 99* 94* 93* 97* 96*  --  94*  CO2 20* '26 25 26 26  '$ --  34*  GLUCOSE 151* 147* 178* 143* 161*  --  163*  BUN 5* '15 14 14 15  '$ --  34*  CREATININE 1.05  1.51* 1.40* 1.28*  1.36*  --  1.50*  CALCIUM 8.8* 8.8* 8.9 8.8* 9.2  --  8.6*  MG 1.4* 2.0  --  2.0  --  2.0 2.0  AST 16  --   --   --   --   --   --   ALT 14*  --   --   --   --   --   --   ALKPHOS 80  --   --   --   --   --   --   BILITOT 1.3*  --   --   --   --   --   --    ------------------------------------------------------------------------------------------------------------------ No results for input(s): CHOL, HDL, LDLCALC, TRIG, CHOLHDL, LDLDIRECT in the last 72 hours.  No results found for: HGBA1C ------------------------------------------------------------------------------------------------------------------ No results for input(s): TSH, T4TOTAL, T3FREE, THYROIDAB in the last 72 hours.  Invalid input(s): FREET3 ------------------------------------------------------------------------------------------------------------------ No results for input(s): VITAMINB12, FOLATE, FERRITIN, TIBC, IRON, RETICCTPCT in the last 72 hours.  Coagulation profile  Recent Labs Lab 08/15/15 1946  INR 1.83    No results for input(s): DDIMER in the last 72 hours.  Cardiac Enzymes  Recent Labs Lab 08/15/15 1946 08/16/15 0138 08/16/15 0859  TROPONINI 0.03* 0.04* 0.03*   ------------------------------------------------------------------------------------------------------------------    Component Value Date/Time   BNP 1,419.2 (H) 08/15/2015 1955     Tyde Lamison M.D on 08/21/2015 at 1:33 PM  Between 7am to 7pm - Pager - [phone]  After 7pm go to [email or web address] - password [email or web address]  [facility] -  Office  [phone]  Dragon dictation system was used to create this note, attempts have been made to correct errors, however presence of uncorrected errors is not a reflection quality of care provided

## 2015-08-22 DIAGNOSIS — Z66 Do not resuscitate: Secondary | ICD-10-CM

## 2015-08-22 LAB — GLUCOSE, CAPILLARY
GLUCOSE-CAPILLARY: 143 mg/dL — AB (ref 65–99)
GLUCOSE-CAPILLARY: 208 mg/dL — AB (ref 65–99)

## 2015-08-22 LAB — CREATININE, SERUM
CREATININE: 1.24 mg/dL (ref 0.61–1.24)
GFR calc Af Amer: >60 mL/min (ref >60)
GFR, EST NON AFRICAN AMERICAN: 60 mL/min — AB (ref >60)

## 2015-08-22 MED ORDER — LOSARTAN POTASSIUM 50 MG PO TABS: 50.0000 mg | ORAL_TABLET | Freq: Every evening | ORAL | 0 refills | Status: AC

## 2015-08-22 MED ORDER — PREDNISONE 20 MG PO TABS: 20.0000 mg | ORAL_TABLET | Freq: Every day | ORAL | 0 refills | Status: DC

## 2015-08-22 MED ORDER — PREDNISONE 20 MG PO TABS: 20.0000 mg | ORAL_TABLET | Freq: Every day | ORAL | 0 refills | Status: AC

## 2015-08-22 MED ORDER — RABEPRAZOLE SODIUM 20 MG PO TBEC: 20.0000 mg | DELAYED_RELEASE_TABLET | Freq: Every evening | ORAL | 0 refills | Status: AC

## 2015-08-22 MED ORDER — METOPROLOL TARTRATE 25 MG PO TABS: 25.0000 mg | ORAL_TABLET | Freq: Two times a day (BID) | ORAL | 1 refills | Status: DC

## 2015-08-22 MED ORDER — LORAZEPAM 1 MG PO TABS: 1.0000 mg | ORAL_TABLET | Freq: Three times a day (TID) | ORAL | 0 refills | Status: AC

## 2015-08-22 MED ORDER — RIVAROXABAN 20 MG PO TABS: 20.0000 mg | ORAL_TABLET | Freq: Every day | ORAL | 11 refills | Status: DC

## 2015-08-22 MED ORDER — LAMOTRIGINE 200 MG PO TABS: 200.0000 mg | ORAL_TABLET | Freq: Every evening | ORAL | 0 refills | Status: AC

## 2015-08-22 MED ORDER — QUETIAPINE FUMARATE 100 MG PO TABS: 50.0000 mg | ORAL_TABLET | Freq: Every day | ORAL | 0 refills | Status: DC

## 2015-08-22 MED ORDER — MORPHINE SULFATE 15 MG PO TABS: 15.0000 mg | ORAL_TABLET | ORAL | 0 refills | Status: AC | PRN

## 2015-08-22 MED ORDER — UMECLIDINIUM-VILANTEROL 62.5-25 MCG/INH IN AEPB: 1.0000 | INHALATION_SPRAY | Freq: Every day | RESPIRATORY_TRACT | 5 refills | Status: DC

## 2015-08-22 MED ORDER — RIVAROXABAN 20 MG PO TABS: 20.0000 mg | ORAL_TABLET | Freq: Every day | ORAL | 11 refills | Status: AC

## 2015-08-22 MED ORDER — LOSARTAN POTASSIUM 50 MG PO TABS: 50.0000 mg | ORAL_TABLET | Freq: Every evening | ORAL | 0 refills | Status: DC

## 2015-08-22 MED ORDER — IPRATROPIUM-ALBUTEROL 0.5-2.5 (3) MG/3ML IN SOLN: 3.0000 mL | Freq: Two times a day (BID) | RESPIRATORY_TRACT | 0 refills | Status: AC

## 2015-08-22 MED ORDER — RABEPRAZOLE SODIUM 20 MG PO TBEC: 20.0000 mg | DELAYED_RELEASE_TABLET | Freq: Every evening | ORAL | 0 refills | Status: DC

## 2015-08-22 MED ORDER — PRAVASTATIN SODIUM 20 MG PO TABS: 20.0000 mg | ORAL_TABLET | Freq: Every day | ORAL | 0 refills | Status: DC

## 2015-08-22 MED ORDER — GUAIFENESIN ER 600 MG PO TB12: 600.0000 mg | ORAL_TABLET | Freq: Two times a day (BID) | ORAL | 1 refills | Status: DC

## 2015-08-22 MED ORDER — LAMOTRIGINE 200 MG PO TABS: 200.0000 mg | ORAL_TABLET | Freq: Every evening | ORAL | 0 refills | Status: DC

## 2015-08-22 MED ORDER — BUDESONIDE 0.5 MG/2ML IN SUSP: 0.5000 mg | Freq: Two times a day (BID) | RESPIRATORY_TRACT | 1 refills | Status: AC

## 2015-08-22 MED ORDER — METOPROLOL TARTRATE 25 MG PO TABS: 25.0000 mg | ORAL_TABLET | Freq: Two times a day (BID) | ORAL | 1 refills | Status: AC

## 2015-08-22 MED ORDER — UMECLIDINIUM-VILANTEROL 62.5-25 MCG/INH IN AEPB: 1.0000 | INHALATION_SPRAY | Freq: Every day | RESPIRATORY_TRACT | 5 refills | Status: AC

## 2015-08-22 MED ORDER — BUDESONIDE 0.5 MG/2ML IN SUSP: 0.5000 mg | Freq: Two times a day (BID) | RESPIRATORY_TRACT | 1 refills | Status: DC

## 2015-08-22 MED ORDER — GUAIFENESIN ER 600 MG PO TB12: 600.0000 mg | ORAL_TABLET | Freq: Two times a day (BID) | ORAL | 1 refills | Status: AC

## 2015-08-22 MED ORDER — ALBUTEROL SULFATE HFA 108 (90 BASE) MCG/ACT IN AERS: 2.0000 | INHALATION_SPRAY | Freq: Four times a day (QID) | RESPIRATORY_TRACT | 6 refills | Status: DC | PRN

## 2015-08-22 MED ORDER — ALBUTEROL SULFATE HFA 108 (90 BASE) MCG/ACT IN AERS: 2.0000 | INHALATION_SPRAY | Freq: Four times a day (QID) | RESPIRATORY_TRACT | 6 refills | Status: AC | PRN

## 2015-08-22 MED ORDER — PRAVASTATIN SODIUM 20 MG PO TABS: 20.0000 mg | ORAL_TABLET | Freq: Every day | ORAL | 0 refills | Status: AC

## 2015-08-22 MED ORDER — QUETIAPINE FUMARATE 100 MG PO TABS: 50.0000 mg | ORAL_TABLET | Freq: Every day | ORAL | 0 refills | Status: AC

## 2015-08-22 NOTE — Discharge Summary (Addendum)
Gerald Ho, is a 65 y.o. male  DOB Dec 24, 1950  MRN [id number].  Admission date:  08/15/2015  Admitting Physician  Brand Males, MD  Discharge Date:  08/22/2015   Primary MD  Maryella Shivers, MD  Recommendations for primary care physician for things to follow:   Admission Diagnosis  RESPIRATORY FAILURE   Discharge Diagnosis  RESPIRATORY FAILURE    Principal Problem:   Acute on chronic respiratory failure ([place]) Active Problems:   COPD (chronic obstructive pulmonary disease) (HCC)   Atrial fibrillation with RVR (HCC)   Tobacco abuse disorder   Non-small cell carcinoma of lung, stage 1 (HCC)   Pneumonia   Acute kidney injury (nontraumatic) (HCC)   Chronic anticoagulation   Pulmonary hypertension ([place])   HCAP (healthcare-associated pneumonia)   Palliative care encounter   DNR (do not resuscitate) discussion   DNR (do not resuscitate)      Past Medical History:  Diagnosis Date  . Abdominal aortic aneurysm (AAA) ([place]) 03/07/2015  . Bipolar disorder ([place])    Takes Lamictal & Seroquel  . Current smoker    2 packs per day  . Dysrhythmia    Atrial Fibrillation  . Emphysema   . GERD (gastroesophageal reflux disease)   . High blood pressure   . Hyperlipemia   . Non-small cell carcinoma of lung, stage 1 ([place]) 02/24/2015  . Pneumonia    hx of  . Shortness of breath dyspnea     Past Surgical History:  Procedure Laterality Date  . CARDIOVERSION N/A 02/26/2014   Procedure: CARDIOVERSION;  Surgeon: Thayer Headings, MD;  Location: [facility] ENDOSCOPY;  Service: Cardiovascular;  Laterality: N/A;  . COLONOSCOPY W/ POLYPECTOMY    . LUMBAR DISC SURGERY     X 2  . THUMB FUSION Right   . UPPER GI ENDOSCOPY    . VIDEO BRONCHOSCOPY WITH ENDOBRONCHIAL NAVIGATION N/A 01/29/2015   Procedure: VIDEO BRONCHOSCOPY WITH ENDOBRONCHIAL NAVIGATION;  Surgeon: Collene Gobble, MD;  Location: [facility];  Service: Thoracic;   Laterality: N/A;  . VIDEO BRONCHOSCOPY WITH ENDOBRONCHIAL ULTRASOUND N/A 01/29/2015   Procedure: VIDEO BRONCHOSCOPY WITH ENDOBRONCHIAL ULTRASOUND;  Surgeon: Collene Gobble, MD;  Location: [place];  Service: Thoracic;  Laterality: N/A;       HPI  from the history and physical done on the day of admission:    CHIEF COMPLAINT:  Dyspnea  HISTORY OF PRESENT ILLNESS:   Gerald Ho is an 65 y.o. 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 He also had a nodule in the LUL which was treated concurrently as there was concern for synchronous primary lung CA. He has had some dyspnea since his radiation completed in June, but a CT at that time was not suggestive of radiation-induced lung injury. 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     Hospital Course:      1)Acute and Chronic  Hypoxic Respiratory Failure- unable to wean patient below 12 hours via nasal cannula, continue supplemental oxygen and bronchodilators, Discharge home on prednisone, continue bronchodilators and mucolytics, BiPAP daily at bedtime and when necessary during the day  2)Severe COPD- contributing to #1 above, patient has > 80-pack-year smoking, he continued to smoke just prior to admission this time around. No infectious pneumonia, likely component of radiation pneumonitis (patient had stereotactic radiation for lung cancer), smoking cessation strongly advised  3)Afib/Chronic systolic dysfunction CHF-continue metoprolol for rate control, last known EF 35% from 2015, continue Xarelto  for anticoagulation. Continue Lasix  4)Liver- history of more than average EtOH use, abdominal ultrasound from 8/11 suggest possible cirrhosis, no encephalopathy, bilirubin elevated. Alcohol cessation strongly advised  5)Social/ethics-DNR/DNI, palliative care team  input appreciated, MOST form  completed with the help of palliative care team.   [name] has accepted patient, discharge home on 08/22/2015 on high flow oxygen.    Discharge Condition: Poor, prognosis is poor overall  Follow UP-hospice team    Consults obtained - palliative/hospice/pulmonary care  Diet and Activity recommendation:  As advised  Discharge Instructions     Discharge Instructions    Call MD for:  difficulty breathing, headache or visual disturbances    Complete by:  As directed   Call MD for:  persistant dizziness or light-headedness    Complete by:  As directed   Call MD for:  temperature >100.4    Complete by:  As directed   Diet - low sodium heart healthy    Complete by:  As directed   Discharge instructions    Complete by:  As directed   Avoid smoking at home as you have high flow oxygen Take medications as prescribed Call or contact the Hospice team with any concerns   Increase activity slowly    Complete by:  As directed        Discharge Medications       Medication List    STOP taking these medications   carvedilol 12.5 MG tablet Commonly known as:  COREG     TAKE these medications   albuterol 108 (90 Base) MCG/ACT inhaler Commonly known as:  PROVENTIL HFA;VENTOLIN HFA Inhale 2 puffs into the lungs every 6 (six) hours as needed for wheezing or shortness of breath.   budesonide 0.5 MG/2ML nebulizer solution Commonly known as:  PULMICORT Take 2 mLs (0.5 mg total) by nebulization 2 (two) times daily.   guaiFENesin 600 MG 12 hr tablet Commonly known as:  MUCINEX Take 1 tablet (600 mg total) by mouth 2 (two) times daily.   ipratropium-albuterol 0.5-2.5 (3) MG/3ML Soln Commonly known as:  DUONEB Take 3 mLs by nebulization 2 (two) times daily. What changed:  how much to take   lamoTRIgine 200 MG tablet Commonly known as:  LAMICTAL Take 1 tablet (200 mg total) by mouth every evening. 7 pm   LORazepam 1 MG  tablet Commonly known as:  ATIVAN Take 1 tablet (1 mg total) by mouth every 8 (eight) hours.   losartan 50 MG tablet Commonly known as:  COZAAR Take 1 tablet (50 mg total) by mouth every evening. 7pm   metoprolol tartrate 25 MG tablet Commonly known as:  LOPRESSOR Take 1 tablet (25 mg total) by mouth 2 (two) times daily.   morphine 15 MG tablet Commonly known as:  MSIR Take 1 tablet (15 mg total) by mouth every 4 (four) hours as needed for severe pain.   OXYGEN Inhale 5  L into the lungs continuous.   pravastatin 20 MG tablet Commonly known as:  PRAVACHOL Take 1 tablet (20 mg total) by mouth at bedtime.   predniSONE 20 MG tablet Commonly known as:  DELTASONE Take 1 tablet (20 mg total) by mouth daily with breakfast.   QUEtiapine 100 MG tablet Commonly known as:  SEROQUEL Take 0.5-1 tablets (50-100 mg total) by mouth at bedtime.   RABEprazole 20 MG tablet Commonly known as:  ACIPHEX Take 1 tablet (20 mg total) by mouth every evening. 7pm   rivaroxaban 20 MG Tabs tablet Commonly known as:  XARELTO Take 1 tablet (20 mg total) by mouth daily with supper. What changed:  when to take this  additional instructions   umeclidinium-vilanterol 62.5-25 MCG/INH Aepb Commonly known as:  ANORO ELLIPTA Inhale 1 puff into the lungs daily.       Major procedures and Radiology Reports - PLEASE review detailed and final reports for all details, in brief -   Ct Chest W Contrast  Result Date: 08/17/2015 CLINICAL DATA:  Shortness of breath.  History of lung carcinoma. EXAM: CT CHEST WITH CONTRAST TECHNIQUE: Multidetector CT imaging of the chest was performed during intravenous contrast administration. CONTRAST:  55m ISOVUE-300 IOPAMIDOL (ISOVUE-300) INJECTION 61% COMPARISON:  Chest radiograph, 08/15/2015.  Chest CT, 06/05/2015. FINDINGS: Neck base and axilla:  No mass or adenopathy. Cardiovascular: Heart is mildly enlarged. There are dense coronary artery calcifications. Calcified  atherosclerotic plaque noted along the thoracic aorta and at the origin of the aortic arch branch vessels. No significant stenosis. Mediastinum/Nodes: Prominent to mildly enlarged mediastinal lymph nodes. There is a 12 mm short axis right peritracheal, azygos level node. Allowing for measurement technique differences, this is without change from the prior CT. No mediastinal or hilar masses. Lungs/Pleura: Hazy airspace opacity and irregular interstitial opacities are noted in the superior segment of the right upper lobe with associated volume loss. There is additional posterior right lower lobe opacity adjacent to a small right effusion. There is interstitial thickening in the posterior lateral aspect of the right upper lobe. Coarse reticular opacities and mild bronchiectasis is noted in the posterior medial left upper lobe. When compared the prior exam, the opacity in the superior segment of the right lower lobe has increased in the dependent atelectasis has developed. A small right effusion has increased in size. There are underlying changes of advanced emphysema. Upper Abdomen: Small gallstones. No liver mass on the included field of view. No adrenal masses. No acute findings. Aortic atherosclerosis. Musculoskeletal: Mild wedge-shaped compression fracture of T12, chronic. Mild compression fracture of T5 also chronic. No osteoblastic or osteolytic lesions. IMPRESSION: 1. Increased interstitial type opacities intervening airspace opacity in the superior segment of the right lower lobe with associated volume loss. This may reflect radiation induced fibrosis progressing since the prior study. Pneumonia is possible. Small right pleural effusion is increased from the prior study as has dependent right lower lobe atelectasis. 2. No other significant change from prior exam. There stable areas of lung scarring and stable changes of advanced emphysema. Mild mediastinal adenopathy is stable. Electronically Signed   By: DLajean ManesM.D.   On: 08/17/2015 12:02   Dg Chest Port 1 View  Result Date: 08/20/2015 CLINICAL DATA:  65y/o  M; respiratory failure. EXAM: PORTABLE CHEST 1 VIEW COMPARISON:  Chest radiograph dated 08/19/2015. FINDINGS: Stable cardiac silhouette given differences in technique. No acute osseous abnormality is evident. No pneumothorax. Interstitial pulmonary edema and small effusions with bibasilar atelectasis, mildly  improved from the prior study. IMPRESSION: Interstitial pulmonary edema and small effusions with basilar atelectasis mildly improved in comparison with prior radiograph. Electronically Signed   By: Kristine Garbe M.D.   On: 08/20/2015 06:48   Dg Chest Port 1 View  Result Date: 08/19/2015 CLINICAL DATA:  Respiratory failure. EXAM: PORTABLE CHEST 1 VIEW COMPARISON:  08/18/2015.  01/31/2015. FINDINGS: Cardiomegaly with pulmonary venous congestion and bilateral pulmonary infiltrates again noted without interim change. Findings consistent with bilateral pulmonary edema. Small bilateral pleural effusions. Costophrenic angles not completely imaged. No pneumothorax. Underlying chronic interstitial lung disease. IMPRESSION: 1. Persistent changes of congestive heart failure with bilateral pulmonary edema and small pleural effusions. No interim change from prior exam. 2.  Underlying chronic interstitial lung disease. Electronically Signed   By: Marcello Moores  Register   On: 08/19/2015 07:03   Dg Chest Port 1 View  Result Date: 08/18/2015 CLINICAL DATA:  Shortness of breath.  History of lung cancer. EXAM: PORTABLE CHEST 1 VIEW COMPARISON:  CT 08/17/2015.  Chest x-ray 11/2015 common 04/04/2015. FINDINGS: Cardiomegaly with slight pulmonary vascular prominence and bilateral progressive interstitial prominence suggesting congestive heart failure. Underlying chronic interstitial lung disease is present. Small right pleural effusion. No pneumothorax . IMPRESSION: 1. Cardiomegaly with mild pulmonary vascular  prominence and increased interstitial prominence. Small right pleural effusion. Findings consistent with congestive heart failure. 2. Underlying chronic interstitial lung disease. Electronically Signed   By: Marcello Moores  Register   On: 08/18/2015 06:48   Dg Chest Port 1 View  Result Date: 08/15/2015 CLINICAL DATA:  65 year old male with cough. Lung cancer. Abnormal right lung and hilum since chest CT 06/05/2015. EXAM: PORTABLE CHEST 1 VIEW COMPARISON:  Radiographs 08/15/2015 and earlier. FINDINGS: Portable AP semi upright view at 2017 hours. Stable lung volumes. Stable cardiomegaly and mediastinal contours. Continued asymmetric increased opacity at the right hilum. Bilateral basilar predominant increased interstitial markings are stable. No pneumothorax or pleural effusion. No new pulmonary opacity. IMPRESSION: No new cardiopulmonary abnormality. Stable abnormal right lung and hilar findings as described yesterday and by CT on 06/05/2015. Electronically Signed   By: Genevie Ann M.D.   On: 08/15/2015 20:33   US Abdomen Limited Ruq  Result Date: 08/15/2015 CLINICAL DATA:  Initial evaluation for palmar erythema, concern for cirrhosis. EXAM: US ABDOMEN LIMITED - RIGHT UPPER QUADRANT COMPARISON:  None. FINDINGS: Gallbladder: Several echogenic stones present within the gallbladder lumen, largest wedged measured approximately 2 mm. Gallbladder sludge present. Gallbladder wall measured at the upper limits of normal at 3 mm. No free pericholecystic fluid. No sonographic Murphy's sign was not exam, although the patient was medicated. Common bile duct: Diameter: 4.2 mm. Liver: No focal lesion identified. Diffusely increased echogenicity, most commonly associated with steatosis. No obvious changes related to cirrhosis identified. IMPRESSION: 1. Cholelithiasis with gallbladder sludge without sonographic evidence for acute cholecystitis. No biliary dilatation. 2. Increased and somewhat coarse echogenicity within the hepatic  parenchyma. While this finding is often related to hepatic steatosis, possible early cirrhotic changes could also have this appearance. Correlation with the laboratory values recommended. Electronically Signed   By: Jeannine Boga M.D.   On: 08/15/2015 22:40    Micro Results    Recent Results (from the past 240 hour(s))  MRSA PCR Screening     Status: None   Collection Time: 08/15/15  6:55 PM  Result Value Ref Range Status   MRSA by PCR NEGATIVE NEGATIVE Final    Comment:        The GeneXpert MRSA Assay (FDA approved for  NASAL specimens only), is one component of a comprehensive MRSA colonization surveillance program. It is not intended to diagnose MRSA infection nor to guide or monitor treatment for MRSA infections.   Culture, blood (routine x 2)     Status: None   Collection Time: 08/15/15  7:46 PM  Result Value Ref Range Status   Specimen Description BLOOD RIGHT ANTECUBITAL  Final   Special Requests BOTTLES DRAWN AEROBIC AND ANAEROBIC 10CC  Final   Culture NO GROWTH 5 DAYS  Final   Report Status 08/20/2015 FINAL  Final  Culture, blood (routine x 2)     Status: None   Collection Time: 08/15/15  7:54 PM  Result Value Ref Range Status   Specimen Description BLOOD LEFT ANTECUBITAL  Final   Special Requests BOTTLES DRAWN AEROBIC AND ANAEROBIC 10CC  Final   Culture NO GROWTH 5 DAYS  Final   Report Status 08/20/2015 FINAL  Final  Urine culture     Status: Abnormal   Collection Time: 08/15/15  9:12 PM  Result Value Ref Range Status   Specimen Description URINE, CLEAN CATCH  Final   Special Requests Immunocompromised  Final   Culture 2,000 COLONIES/mL INSIGNIFICANT GROWTH (A)  Final   Report Status 08/17/2015 FINAL  Final       Today   Subjective    Effie Berkshire today has no New complaints, shortness of breath persisted, requiring level liters of oxygen via nasal cannula, No fever  Or chills , nausea, vomiting, diarrhea           Patient has been seen and examined  prior to discharge   Objective   Blood pressure (!) 144/82, pulse 81, temperature 97.5 F (36.4 C), temperature source Oral, resp. rate 19, height 6' (1.829 m), weight 101.2 kg (223 lb 1.7 oz), SpO2 (!) 87 %.   Intake/Output Summary (Last 24 hours) at 08/22/15 1334 Last data filed at 08/22/15 1000  Gross per 24 hour  Intake              980 ml  Output             1275 ml  Net             -295 ml    Exam Gen:- Awake  In no apparent distress  HEENT:- [place].AT,   Nose- [place] at 11 L/min Neck-Supple Neck,No JVD,  Lungs-diminished bilaterally very few scattered wheezes CV- S1, S2 normal, irregular Abd-  +ve B.Sounds, Abd Soft, No tenderness,    Extremity/Skin:- Intact peripheral pulses,  trace edema   Data Review   CBC w Diff:  Lab Results  Component Value Date   WBC 6.4 08/21/2015   HGB 13.5 08/21/2015   HGB 12.5 (L) 06/05/2015   HCT 40.6 08/21/2015   HCT 35.9 (L) 06/05/2015   PLT 177 08/21/2015   PLT 177 06/05/2015   LYMPHOPCT 9 08/18/2015   LYMPHOPCT 19.4 06/05/2015   MONOPCT 3 08/18/2015   MONOPCT 5.9 06/05/2015   EOSPCT 0 08/18/2015   EOSPCT 3.0 06/05/2015   BASOPCT 0 08/18/2015   BASOPCT 0.3 06/05/2015    CMP:  Lab Results  Component Value Date   NA 136 08/21/2015   NA 134 (L) 06/05/2015   K 3.8 08/21/2015   K 4.3 06/05/2015   CL 94 (L) 08/21/2015   CO2 34 (H) 08/21/2015   CO2 23 06/05/2015   BUN 34 (H) 08/21/2015   BUN 16.0 06/05/2015   CREATININE 1.24 08/22/2015   CREATININE 1.4 (  H) 06/05/2015   PROT 6.7 08/15/2015   PROT 7.4 06/05/2015   ALBUMIN 3.7 08/15/2015   ALBUMIN 3.8 06/05/2015   BILITOT 1.3 (H) 08/15/2015   BILITOT 1.60 (H) 06/05/2015   ALKPHOS 80 08/15/2015   ALKPHOS 88 06/05/2015   AST 16 08/15/2015   AST 11 06/05/2015   ALT 14 (L) 08/15/2015   ALT <9 06/05/2015  .  Total Discharge time is about 33 minutes  Lillyanne Bradburn M.D on 08/22/2015 at 1:34 PM  [facility]   Office  [phone]  Dragon dictation system was used  to create this note, attempts have been made to correct errors, however presence of uncorrected errors is not a reflection quality of care provided

## 2015-08-22 NOTE — Progress Notes (Signed)
OT Cancellation and Discharge Note  Patient Details Name: Gerald Ho MRN: [id number] DOB: 1950-11-06   Cancelled Treatment:    Reason Eval/Treat Not Completed: OT screened, no needs identified, will sign off.  Pt with plan to discharge home with Hospice today.  Per chart, he has access to DME.  PT saw him and he required min guard assist with functional mobility, but limited activity tolerance.  Will defer OT at this time due to pending discharge and do not want to fatigue him further.    [place], OTR/L [phone]   [name] M 08/22/2015, 12:41 PM

## 2015-08-22 NOTE — Care Management Note (Addendum)
Case Management Note  Patient Details  Name: Gerald Ho MRN: [id number] Date of Birth: Dec 13, 1950  Subjective/Objective:      Case Management Note  Patient Details  Name: Gerald Ho MRN: [id number] Date of Birth: 20-Aug-1950  Subjective/Objective:        Pt admitted in resp distress - s/p Lung CA radiation            Action/Plan:  PTA from home alone - per pt he was able to do all ADLs.  On home O2 before current event 2 -5 liters (latter for exertion) .  Prior to this admit pt was using two separate O2 tanks at 5 liters each to attempt to achieve 10 liters total continuous.  Pt receives oxygen from [facility] - attending made aware of home O2 need at home.  Currently pt is flucuating between BIPAP and HF [place] 12-15 liters.  CM will continue to follow for discharge needs      Expected Discharge Date:                  Expected Discharge Plan:  Home w Hospice Care  In-House Referral:     Discharge planning Services  CM Consult  Post Acute Care Choice:    Choice offered to:     DME Arranged:    DME Agency:     HH Arranged:    HH Agency:     Status of Service:  Completed, signed off  If discussed at [employer] of Stay Meetings, dates discussed:    Additional  Comments: 08/22/2015  CM contacted [facility] at 3:40 - service stated they were running behind.  CM provided bedside nurse with hospice agency number [phone] to call once pt is in route.  Per hospice agency [facility] will deliver BIPAP after hours.  Pt will discharge home with hospice. Discharge order written, prescriptions faxed to agency - agency verified receipt.  Oxygen equipment was delivered to home yesterday - BIPAP will be placed in the home today - pt has to be in home before BIPAP can be delivered per agency.  [facility] contacted and have accepted pick up for 3pm - made aware of need of HF O2 (10-12L).  Necessary forms for transport provided to bedside nurse.  CM spoke directly with receiving nurse Sharyn Lull with Hospice and  will meet pt at home at approximately at 3pm.    08/21/15 BIPAP order faxed.  Per attending - discharge tomorrow 08/22/15  Liaison verified she received the required documents.  Michelle from [facility] is at bedside with pt.  [place] stated agency will accept pt at current condition - agency will accept pt up to 15L [place].  CM will ask for BIPAP order.  CM informed attending that [facility] can accept pt at current state - attending to follow up with Palliative.   CM faxed [place], H&P, progress notes including palliative care, home with hospice order to [facility] attention [place].  Pt currently requiring 12 liter on [place].   08/20/15 Palliative team to place home with hospice order -  Informed that pt chose [facility].  Palliative team consulted and has determined with pt that Discharge plan is for pt to discharge home with hospice.   Pt is very appropriate, alert and oriented.  CM Provided choice for [facility] with hospice - pt quickly chose [facility].  CM contacted agency and provided tentative referral - agency to visit with pt tomorrow am - aware of oxygen  need including HS BIPAP.Mar[place] Kitchen  CM text paged/called  Palliative team to request [place] order.   Pt denied needing any equipment in the home - states he already has walker and can and will borrow 3:1 from neighbor if the need arises.  Pt is home alone but stated that he will have strong support from his neighbor, brother and neighbors daughters - daughter is very supportive however she lives out of town .  He stated that if he needed to pay the neighbors daughters to be with him on continuous basis that he could do that as well - denied needing private pay list.    Pt is still requiring a higher need of O2 for a safe discharge home.  CM will continue to follow for discharge needs Gerald [place]rad[place], RN 08/22/2015, 1:22 PM               A

## 2015-08-25 LAB — GLUCOSE, CAPILLARY: GLUCOSE-CAPILLARY: 175 mg/dL — AB (ref 65–99)

## 2015-09-10 ENCOUNTER — Ambulatory Visit: Payer: BLUE CROSS/BLUE SHIELD | Admitting: Internal Medicine

## 2015-09-17 ENCOUNTER — Ambulatory Visit (HOSPITAL_COMMUNITY): Payer: BLUE CROSS/BLUE SHIELD

## 2015-09-17 ENCOUNTER — Ambulatory Visit: Payer: BLUE CROSS/BLUE SHIELD | Attending: Radiation Oncology

## 2015-09-18 ENCOUNTER — Ambulatory Visit: Payer: Self-pay | Admitting: Radiation Oncology

## 2015-09-22 ENCOUNTER — Ambulatory Visit
Admission: RE | Admit: 2015-09-22 | Payer: BLUE CROSS/BLUE SHIELD | Source: Ambulatory Visit | Admitting: Radiation Oncology

## 2015-09-22 ENCOUNTER — Ambulatory Visit: Payer: BLUE CROSS/BLUE SHIELD | Admitting: Internal Medicine

## 2015-10-05 DEATH — deceased

## 2017-10-05 IMAGING — CT NM PET TUM IMG INITIAL (PI) SKULL BASE T - THIGH
1 of 8 series · 1 of 25 positions shown · non-contrast
Comparison: Low dose chest CT on 12/17/2014 and PET-CT on
01/11/2013

CLINICAL DATA: Initial treatment strategy for bilateral pulmonary
nodules.

EXAM:
NUCLEAR MEDICINE PET SKULL BASE TO THIGH
TECHNIQUE: 12.0 mCi F-18 FDG was injected intravenously. Full-ring PET imaging
was performed from the skull base to thigh after the radiotracer. CT
data was obtained and used for attenuation correction and anatomic
localization.
FASTING BLOOD GLUCOSE:  Value: 115 mg/dl

[Series 4: ct sk_thigh 5.0 b31f · axial · 5.0mm · 0.98mm/px · 1 of 239 slices shown]
[im 239/239  brain]
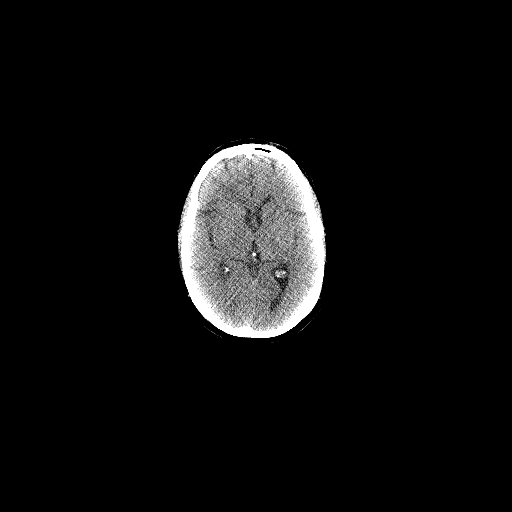

[1 of 25 positions shown; findings below may reference images not displayed]

FINDINGS: NECK

No hypermetabolic lymph nodes in the neck.

CHEST

10 mm irregular pulmonary nodule in the posterior left upper lobe on
image 21/series 6 is hypermetabolic with SUV max of 5.1.

Pleural-based pulmonary nodule in the superior segment of the right
lower lobe measures 1.7 x 3.2 cm on image 85/series 4. This is also
hypermetabolic, with SUV max of 10.3.

Moderate to severe emphysema noted. No evidence of pleural effusion.
Normal size bilateral axillary lymph nodes are again seen which show
low-grade metabolic activity are most likely reactive in etiology.

No hypermetabolic hilar lymph nodes are identified. There are
several small less than 1 cm mediastinal lymph nodes in the right
paratracheal and AP window regions measuring up to 10 mm. The show
no significant change in size compared to 3188 exam, but do show
increased low-grade metabolic activity slightly above mediastinal
baseline, with SUV max of 3.7.

ABDOMEN/PELVIS

No abnormal hypermetabolic activity within the liver, pancreas,
adrenal glands, or spleen. No hypermetabolic lymph nodes in the
abdomen or pelvis.

Probable tiny calcified gallstones noted without evidence of
cholecystitis or biliary dilatation. 3.7 cm infrarenal abdominal
aortic aneurysm is seen, mildly increased from 3.4 cm on 01/11/2013
exam.

SKELETON

No focal hypermetabolic activity to suggest skeletal metastasis.
IMPRESSION: 10 mm hypermetabolic posterior left upper lobe pulmonary nodule,
with SUV max of 5.1. Primary bronchogenic carcinoma cannot be
excluded.

2 x 3 cm pleural-based mass in superior segment of right lower lobe,
with hypermetabolic activity and SUV max of 10.3. Synchronous
primary bronchogenic carcinoma cannot be excluded.

Mediastinal lymph nodes in the right paratracheal and AP window
regions measuring up to 10 mm. Although these are stable in size
compared to 3188 exam they show increased low-grade metabolic
activity. Metastatic disease cannot be excluded.

Moderate to severe emphysema.

No evidence of metastatic disease within the neck, abdomen, or
pelvis.

3.7 cm infrarenal abdominal aortic aneurysm. Recommend follow up by
[place] in 9years. This recommendation follows ACR consensus guidelines:
White Paper of the ACR Incidental Findings Committee II on Vascular
Findings. J Am Coll SadiolODFO; [DATE].

## 2020-04-09 ENCOUNTER — Other Ambulatory Visit (HOSPITAL_COMMUNITY): Payer: Self-pay

## 2021-04-02 ENCOUNTER — Other Ambulatory Visit (HOSPITAL_COMMUNITY): Payer: Self-pay
